# Patient Record
Sex: Female | Born: 1995 | Race: White | Hispanic: No | Marital: Married | Smoking: Former smoker
Health system: Southern US, Community
[De-identification: ages and names within clinical notes are randomized; demographics above are authoritative.]

## PROBLEM LIST (undated history)

## (undated) DIAGNOSIS — G43909 Migraine, unspecified, not intractable, without status migrainosus: Secondary | ICD-10-CM

## (undated) DIAGNOSIS — K219 Gastro-esophageal reflux disease without esophagitis: Secondary | ICD-10-CM

## (undated) DIAGNOSIS — K635 Polyp of colon: Secondary | ICD-10-CM

## (undated) DIAGNOSIS — J302 Other seasonal allergic rhinitis: Secondary | ICD-10-CM

## (undated) DIAGNOSIS — N6452 Nipple discharge: Secondary | ICD-10-CM

## (undated) DIAGNOSIS — E785 Hyperlipidemia, unspecified: Secondary | ICD-10-CM

## (undated) HISTORY — DX: Hyperlipidemia, unspecified: E78.5

## (undated) HISTORY — DX: Polyp of colon: K63.5

## (undated) HISTORY — PX: WISDOM TOOTH EXTRACTION: SHX21

---
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
## 2013-01-14 ENCOUNTER — Other Ambulatory Visit: Payer: BC Managed Care – PPO | Admitting: Internal Medicine

## 2013-01-14 DIAGNOSIS — N912 Amenorrhea, unspecified: Secondary | ICD-10-CM

## 2013-01-14 DIAGNOSIS — E785 Hyperlipidemia, unspecified: Secondary | ICD-10-CM

## 2013-01-14 DIAGNOSIS — R634 Abnormal weight loss: Secondary | ICD-10-CM

## 2013-01-14 DIAGNOSIS — Z0000 Encounter for general adult medical examination without abnormal findings: Secondary | ICD-10-CM

## 2013-01-14 DIAGNOSIS — R112 Nausea with vomiting, unspecified: Secondary | ICD-10-CM

## 2013-01-14 LAB — CHOLESTEROL, TOTAL: Cholesterol: 167 mg/dL (ref 0–200)

## 2013-01-14 LAB — CBC WITH DIFFERENTIAL/PLATELET
Basophils Absolute: 0 10*3/uL (ref 0.0–0.1)
HCT: 37.6 % (ref 36.0–46.0)
Lymphocytes Relative: 38 % (ref 12–46)
Lymphs Abs: 2.4 10*3/uL (ref 0.7–4.0)
Monocytes Absolute: 0.3 10*3/uL (ref 0.1–1.0)
Neutro Abs: 3.7 10*3/uL (ref 1.7–7.7)
RBC: 4.23 MIL/uL (ref 3.87–5.11)
RDW: 13.3 % (ref 11.5–15.5)
WBC: 6.5 10*3/uL (ref 4.0–10.5)

## 2013-01-14 LAB — COMPREHENSIVE METABOLIC PANEL
AST: 16 U/L (ref 0–37)
Albumin: 4.7 g/dL (ref 3.5–5.2)
Alkaline Phosphatase: 44 U/L (ref 39–117)
Glucose, Bld: 87 mg/dL (ref 70–99)
Potassium: 3.9 mEq/L (ref 3.5–5.3)
Sodium: 138 mEq/L (ref 135–145)
Total Protein: 7.1 g/dL (ref 6.0–8.3)

## 2013-02-18 ENCOUNTER — Ambulatory Visit (INDEPENDENT_AMBULATORY_CARE_PROVIDER_SITE_OTHER): Payer: BC Managed Care – PPO | Admitting: Internal Medicine

## 2013-02-18 ENCOUNTER — Encounter: Payer: Self-pay | Admitting: Internal Medicine

## 2013-02-18 VITALS — BP 96/58 | HR 88 | Temp 98.7°F | Ht 61.75 in | Wt 101.0 lb

## 2013-02-18 DIAGNOSIS — N912 Amenorrhea, unspecified: Secondary | ICD-10-CM

## 2013-02-18 DIAGNOSIS — N946 Dysmenorrhea, unspecified: Secondary | ICD-10-CM

## 2013-02-18 DIAGNOSIS — E785 Hyperlipidemia, unspecified: Secondary | ICD-10-CM

## 2013-02-18 DIAGNOSIS — Z8669 Personal history of other diseases of the nervous system and sense organs: Secondary | ICD-10-CM

## 2013-02-18 DIAGNOSIS — F439 Reaction to severe stress, unspecified: Secondary | ICD-10-CM

## 2013-02-18 DIAGNOSIS — Z0000 Encounter for general adult medical examination without abnormal findings: Secondary | ICD-10-CM

## 2013-02-18 DIAGNOSIS — N926 Irregular menstruation, unspecified: Secondary | ICD-10-CM

## 2013-02-18 DIAGNOSIS — Z733 Stress, not elsewhere classified: Secondary | ICD-10-CM

## 2013-02-18 NOTE — Progress Notes (Signed)
Subjective:    Patient ID: Vanessa Winters, female    DOB: [birth year], 17 y.o.   MRN: [id number]  HPI 17 year old female with familial hyperlipidemia in today for health maintenance and evaluation of medical issues. His been having considerable problems with frequent headaches which sound like migraine headaches. Sometimes associated with nausea and vomiting. Patient is attending community college in [place]. 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 In October she broke up with a long-term boyfriend. Seems happier nail and is dating someone new. Doesn't recall that any foods trigger the headaches. Says she's getting enough sleep. However there stress in the home with her sister who is suspected of being on drugs and exhibiting mood swings.  Patient has been having issues over the past year with infrequent menses up to 4 months. When she does have a menstrual period there is severe dysmenorrhea. She denies being sexually active. She refuses to have Pap smear. Mother is with her today in the exam room and is not eager for her to have Pap smear either. I think she needs to be evaluated by GYN physician for menstrual irregularities. I did do a pregnancy test in November which was negative. At that time she was complaining of headaches but was not able to get an appointment here before now because she was away at school and we had no openings before now. She had normal CBC, C. met, TSH in November.  Patient also complaining of frequent episodes of swollen lymph nodes in left axilla. Says this is been going on for 3 years.  Social history: Denies being sexually active. 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  Family history: Father with history of hyperlipidemia. Mother with history of breast cancer. 2 sisters both with high cholesterol. Father has not wanted children to be treated with lipid-lowering  agents.  Patient was the 7 lbs. 11 oz. product of a vaginal delivery without complications. Normal developmental milestones.  Patient was evaluated for intermittent paresthesias of legs and arms in 2010 at [facility]. 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 However subsequently they felt she did not have any thyroid abnormality. Subsequently paresthesias and abdominal pain discontinued and were likely related to some stress going on when she was playing softball.    Review of Systems  Constitutional: Negative.        Has lost 7 pounds since 2013  Eyes: Negative.   Respiratory: Negative.   Cardiovascular: Negative.   Gastrointestinal: Negative.   Endocrine: Negative.   Genitourinary:       Amenorrhea up to 4 months at a time didn't has severe dysmenorrhea with menses  Allergic/Immunologic: Negative.   Neurological: Positive for headaches.  Hematological: Negative.   Psychiatric/Behavioral: Negative.   All other systems reviewed and are negative.       Objective:   Physical Exam  Vitals reviewed. Constitutional: She appears well-developed and well-nourished. No distress.  HENT:  Head: Normocephalic and atraumatic.  Right Ear: External ear normal.  Left Ear: External ear normal.  Mouth/Throat: Oropharynx is clear and moist. No oropharyngeal exudate.  Eyes: Conjunctivae and EOM are normal. Right eye exhibits no discharge. Left eye exhibits no discharge.  Neck: Normal range of motion. Neck supple. No JVD present. No thyromegaly present.  No axillary or cervical adenopathy.  Cardiovascular: Normal rate, regular rhythm, normal heart sounds and intact distal pulses.   No murmur  heard. Pulmonary/Chest: Effort normal and breath sounds normal. No stridor. She has no wheezes.  Breasts normal female  Abdominal: Soft. Bowel sounds are normal. She exhibits no distension and no mass. There is no rebound and no guarding.  Genitourinary:    Deferred  Musculoskeletal: Normal range of motion. She exhibits no edema.  No femoral adenopathy  Neurological: She is alert. She has normal reflexes. No cranial nerve deficit. Coordination normal.  Skin: Skin is warm and dry. No rash noted. She is not diaphoretic.  Psychiatric: She has a normal mood and affect. Her behavior is normal. Thought content normal.          Assessment & Plan:  Migraine headaches  Amenorrhea up to 4 months followed by severe dysmenorrhea with menstrual period. This is new over the past year.  Complaint of axillary adenopathy-no adenopathy found today  Stress at home with sister who is exhibiting mood swings and possibly abusing drugs  Plan: Amitriptyline 10 mg at bedtime. Imitrex tablets 100 mg one by mouth at onset of severe migraine not to exceed one tablet per week. May need to see headache specialist. Suggested GYN evaluation. She returns to school January 9 so it may be difficult to get GYN appointment before that time. Spent one hour with patient and her mother

## 2013-02-18 NOTE — Patient Instructions (Signed)
Take amitriptyline 10 mg at bedtime. Take Imitrex tablets at onset of severe migraine. GYN evaluation for irregular menses

## 2013-08-12 ENCOUNTER — Other Ambulatory Visit: Payer: Self-pay

## 2013-08-12 ENCOUNTER — Telehealth: Payer: Self-pay | Admitting: Internal Medicine

## 2013-08-12 MED ORDER — AMITRIPTYLINE HCL 10 MG PO TABS: 10.0000 mg | ORAL_TABLET | Freq: Every day | ORAL | Status: DC

## 2013-08-12 MED ORDER — SUMATRIPTAN SUCCINATE 100 MG PO TABS: 100.0000 mg | ORAL_TABLET | ORAL | Status: DC | PRN

## 2013-08-12 NOTE — Telephone Encounter (Signed)
Refill Imitrex 100 mg disp: one month supply with no refills and Amitriptyline 10 mg #30 with 2 refills. Book PE Dec 2015.

## 2013-08-22 ENCOUNTER — Other Ambulatory Visit: Payer: Self-pay

## 2013-08-22 MED ORDER — SUMATRIPTAN SUCCINATE 100 MG PO TABS: 100.0000 mg | ORAL_TABLET | ORAL | Status: DC | PRN

## 2014-01-13 ENCOUNTER — Other Ambulatory Visit: Payer: Self-pay | Admitting: Internal Medicine

## 2014-01-14 NOTE — Telephone Encounter (Signed)
Refill once. PE due after Feb 18, 2014. Needs appt.

## 2014-01-14 NOTE — Telephone Encounter (Signed)
Imitrex refill sent to pharmacy with message for patient to call and schedule appointment.

## 2014-03-10 ENCOUNTER — Other Ambulatory Visit: Payer: Self-pay | Admitting: Internal Medicine

## 2014-05-09 ENCOUNTER — Other Ambulatory Visit: Payer: BLUE CROSS/BLUE SHIELD | Admitting: Internal Medicine

## 2014-05-09 DIAGNOSIS — E785 Hyperlipidemia, unspecified: Secondary | ICD-10-CM

## 2014-05-09 DIAGNOSIS — Z0000 Encounter for general adult medical examination without abnormal findings: Secondary | ICD-10-CM

## 2014-05-09 DIAGNOSIS — N926 Irregular menstruation, unspecified: Secondary | ICD-10-CM

## 2014-05-09 LAB — LIPID PANEL
CHOLESTEROL: 231 mg/dL — AB (ref 0–200)
HDL: 38 mg/dL — ABNORMAL LOW (ref >=46)
LDL Cholesterol: 182 mg/dL — ABNORMAL HIGH (ref 0–99)
Total CHOL/HDL Ratio: 6.1 Ratio
Triglycerides: 57 mg/dL (ref <150)
VLDL: 11 mg/dL (ref 0–40)

## 2014-05-09 LAB — CBC WITH DIFFERENTIAL/PLATELET
BASOS PCT: 0 % (ref 0–1)
Basophils Absolute: 0 10*3/uL (ref 0.0–0.1)
EOS ABS: 0.1 10*3/uL (ref 0.0–0.7)
Eosinophils Relative: 1 % (ref 0–5)
HEMATOCRIT: 38.1 % (ref 36.0–46.0)
Hemoglobin: 12.6 g/dL (ref 12.0–15.0)
Lymphocytes Relative: 33 % (ref 12–46)
Lymphs Abs: 2.4 10*3/uL (ref 0.7–4.0)
MCH: 30.4 pg (ref 26.0–34.0)
MCHC: 33.1 g/dL (ref 30.0–36.0)
MCV: 91.8 fL (ref 78.0–100.0)
MONO ABS: 0.2 10*3/uL (ref 0.1–1.0)
MONOS PCT: 3 % (ref 3–12)
MPV: 9.7 fL (ref 8.6–12.4)
Neutro Abs: 4.6 10*3/uL (ref 1.7–7.7)
Neutrophils Relative %: 63 % (ref 43–77)
Platelets: 263 10*3/uL (ref 150–400)
RBC: 4.15 MIL/uL (ref 3.87–5.11)
RDW: 12.6 % (ref 11.5–15.5)
WBC: 7.3 10*3/uL (ref 4.0–10.5)

## 2014-05-09 LAB — COMPREHENSIVE METABOLIC PANEL
ALBUMIN: 4.1 g/dL (ref 3.5–5.2)
ALT: 10 U/L (ref 0–35)
AST: 15 U/L (ref 0–37)
Alkaline Phosphatase: 46 U/L (ref 39–117)
BUN: 8 mg/dL (ref 6–23)
CALCIUM: 9.1 mg/dL (ref 8.4–10.5)
CHLORIDE: 106 meq/L (ref 96–112)
CO2: 25 meq/L (ref 19–32)
CREATININE: 0.71 mg/dL (ref 0.50–1.10)
GLUCOSE: 79 mg/dL (ref 70–99)
POTASSIUM: 4.3 meq/L (ref 3.5–5.3)
Sodium: 139 mEq/L (ref 135–145)
Total Bilirubin: 0.4 mg/dL (ref 0.2–1.2)
Total Protein: 6.9 g/dL (ref 6.0–8.3)

## 2014-05-10 LAB — TSH: TSH: 0.978 u[IU]/mL (ref 0.350–4.500)

## 2014-05-23 ENCOUNTER — Ambulatory Visit (INDEPENDENT_AMBULATORY_CARE_PROVIDER_SITE_OTHER): Payer: BLUE CROSS/BLUE SHIELD | Admitting: Internal Medicine

## 2014-05-23 ENCOUNTER — Other Ambulatory Visit (HOSPITAL_COMMUNITY)
Admission: RE | Admit: 2014-05-23 | Discharge: 2014-05-23 | Disposition: A | Discharge status: Home / Self Care | Payer: BLUE CROSS/BLUE SHIELD | Source: Ambulatory Visit | Attending: Internal Medicine | Admitting: Internal Medicine

## 2014-05-23 VITALS — BP 114/60 | HR 74 | Temp 98.6°F | Ht 61.5 in | Wt 113.0 lb

## 2014-05-23 DIAGNOSIS — Z0000 Encounter for general adult medical examination without abnormal findings: Secondary | ICD-10-CM

## 2014-05-23 DIAGNOSIS — Z01419 Encounter for gynecological examination (general) (routine) without abnormal findings: Secondary | ICD-10-CM | POA: Diagnosis not present

## 2014-05-23 DIAGNOSIS — Z8669 Personal history of other diseases of the nervous system and sense organs: Secondary | ICD-10-CM | POA: Diagnosis not present

## 2014-05-23 DIAGNOSIS — Z1159 Encounter for screening for other viral diseases: Secondary | ICD-10-CM

## 2014-05-23 DIAGNOSIS — E785 Hyperlipidemia, unspecified: Secondary | ICD-10-CM | POA: Diagnosis not present

## 2014-05-23 LAB — POCT URINALYSIS DIPSTICK
Bilirubin, UA: NEGATIVE
Glucose, UA: NEGATIVE
Ketones, UA: NEGATIVE
Leukocytes, UA: NEGATIVE
Nitrite, UA: NEGATIVE
Protein, UA: NEGATIVE
RBC UA: NEGATIVE
SPEC GRAV UA: 1.01
UROBILINOGEN UA: NEGATIVE
pH, UA: 7.5

## 2014-05-23 MED ORDER — SUMATRIPTAN SUCCINATE 100 MG PO TABS: ORAL_TABLET | ORAL | Status: DC

## 2014-05-23 MED ORDER — SIMVASTATIN 10 MG PO TABS: 10.0000 mg | ORAL_TABLET | Freq: Every day | ORAL | Status: DC

## 2014-05-23 MED ORDER — AMITRIPTYLINE HCL 10 MG PO TABS: ORAL_TABLET | ORAL | Status: DC

## 2014-05-23 MED ORDER — HYDROCODONE-ACETAMINOPHEN 5-325 MG PO TABS: 1.0000 | ORAL_TABLET | Freq: Four times a day (QID) | ORAL | Status: DC | PRN

## 2014-05-23 NOTE — Patient Instructions (Signed)
Take one or 2 Elavil at bedtime for migraine prevention. Take rescue medication Norco 5/325 if Imitrex does not work within an hour to 2 hours. Do not wait too long to take Imitrex at onset of migraine. Start Zocor 10 mg daily and return in 3 months

## 2014-05-24 LAB — HIV ANTIBODY (ROUTINE TESTING W REFLEX): HIV: NONREACTIVE

## 2014-05-24 LAB — HEPATITIS C ANTIBODY: HCV Ab: NEGATIVE

## 2014-05-24 LAB — HEPATITIS B SURFACE ANTIBODY,QUALITATIVE: Hep B S Ab: NEGATIVE

## 2014-05-27 LAB — CYTOLOGY - PAP

## 2014-05-28 ENCOUNTER — Telehealth: Payer: Self-pay | Admitting: *Deleted

## 2014-05-28 NOTE — Telephone Encounter (Signed)
Spoke with patient reviewed labs . She states she is having no symptoms of yeast infection at this time. Scheduled patient to start Hep B series due to negative titer.

## 2014-06-13 ENCOUNTER — Ambulatory Visit (INDEPENDENT_AMBULATORY_CARE_PROVIDER_SITE_OTHER): Payer: BLUE CROSS/BLUE SHIELD | Admitting: Internal Medicine

## 2014-06-13 VITALS — HR 72 | Temp 98.0°F

## 2014-06-13 DIAGNOSIS — Z0000 Encounter for general adult medical examination without abnormal findings: Secondary | ICD-10-CM

## 2014-06-13 DIAGNOSIS — Z23 Encounter for immunization: Secondary | ICD-10-CM | POA: Diagnosis not present

## 2014-06-13 NOTE — Progress Notes (Signed)
Patient presents today for repeat Hep B series. Patient to get vaccine #1 today. VS stable . Patient tolerated injection well.

## 2014-07-11 ENCOUNTER — Ambulatory Visit (INDEPENDENT_AMBULATORY_CARE_PROVIDER_SITE_OTHER): Payer: BLUE CROSS/BLUE SHIELD | Admitting: Internal Medicine

## 2014-07-11 VITALS — BP 110/64 | HR 70 | Temp 98.4°F

## 2014-07-11 DIAGNOSIS — Z0000 Encounter for general adult medical examination without abnormal findings: Secondary | ICD-10-CM | POA: Diagnosis not present

## 2014-07-11 MED ORDER — HEPATITIS B VAC RECOMBINANT 10 MCG/ML IJ SUSP
1.0000 mL | Freq: Once | INTRAMUSCULAR | Status: AC
  Administered 2014-07-11: 10 ug via INTRAMUSCULAR

## 2014-07-11 NOTE — Progress Notes (Signed)
Patient presents today for Hep B vaccine #2 in series. VS Stable. Patient tolerated injection well. Next injection due Oct 14,2016.

## 2014-07-20 ENCOUNTER — Encounter: Payer: Self-pay | Admitting: Internal Medicine

## 2014-07-20 NOTE — Progress Notes (Signed)
Subjective:    Patient ID: Vanessa Winters, female    DOB: [birth year], 19 y.o.   MRN: [id number]  HPI 19 year old Female in today for health maintenance exam and evaluation of medical issues including migraine headaches and hyperlipidemia. Hyperlipidemia is familial in her father and 2 sisters. Father had not wanted her a lipid-lowering medication but she is considering it.  In 2010 she had intermittent paresthesias of her legs and arms with negative workup by orthopedist, Dr. Otelia Sergeant. She had some unexplained abdominal pain at that time. 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 They felt she did not have any thyroid abnormality. Subsequently paresthesias and abdominal pain discontinued and were likely related to stress going on when she was playing softball.  Family history: Father with hyperlipidemia. Mother with history of breast cancer. 2 sisters with hyperlipidemia. One sister with history of mood swings and possible drug abuse.  Was diagnosed with migraine headaches and December 2014. 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 She had had a breakup with long-term boyfriend. Did not recall any foods triggering headaches. Was placed on Elavil 10 mg at bedtime and given prescription for generic Imitrex tablets at that time. This seems to have worked well for her.  In 2014 she denied being sexually active. Refuses to have Pap smear although she was complaining of irregular menses and severe dysmenorrhea. She is now on oral contraception as per gynecologist.  Social history: Single with small child. Wants profession in health careers.  Review of Systems  Constitutional: Negative.   Neurological:       Occasional migraine headache  All other systems reviewed and are negative.      Objective:   Physical Exam  Constitutional: She is  oriented to person, place, and time. She appears well-developed and well-nourished. No distress.  HENT:  Head: Normocephalic and atraumatic.  Right Ear: External ear normal.  Left Ear: External ear normal.  Mouth/Throat: Oropharynx is clear and moist. No oropharyngeal exudate.  Eyes: Conjunctivae and EOM are normal. Pupils are equal, round, and reactive to light. Right eye exhibits no discharge. Left eye exhibits no discharge. No scleral icterus.  Neck: Neck supple. No thyromegaly present.  Cardiovascular: Normal rate, regular rhythm, normal heart sounds and intact distal pulses.   No murmur heard. Pulmonary/Chest: Effort normal and breath sounds normal. No respiratory distress. She has no wheezes. She has no rales.  Abdominal: Soft. Bowel sounds are normal. She exhibits no distension and no mass. There is no tenderness. There is no rebound and no guarding.  Genitourinary:  Pap taken. Bimanual normal.  Musculoskeletal: Normal range of motion. She exhibits no edema.  Neurological: She is alert and oriented to person, place, and time. She has normal reflexes. No cranial nerve deficit. Coordination normal.  Skin: Skin is warm and dry. No rash noted. She is not diaphoretic.  Psychiatric: She has a normal mood and affect. Her behavior is normal. Judgment and thought content normal.  Vitals reviewed.         Assessment & Plan:  History of migraine headaches. Give rescue medication Norco 5/325 to take if Imitrex does not work. Do not hesitate to take Imitrex and also of migraine. Continue Elavil 10 mg at bedtime.  Hyperlipidemia-start Zocor 10 mg daily. Total cholesterol was 231 with an LDL cholesterol of 182 and an HDL cholesterol of 38. 3  years ago total cholesterol was 229. 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 Patient will need repeat hepatitis B series with follow-up.

## 2014-08-04 ENCOUNTER — Other Ambulatory Visit: Payer: Self-pay | Admitting: Internal Medicine

## 2014-08-22 ENCOUNTER — Other Ambulatory Visit: Payer: BLUE CROSS/BLUE SHIELD | Admitting: Internal Medicine

## 2014-08-22 DIAGNOSIS — E785 Hyperlipidemia, unspecified: Secondary | ICD-10-CM

## 2014-08-22 DIAGNOSIS — Z79899 Other long term (current) drug therapy: Secondary | ICD-10-CM

## 2014-08-22 LAB — HEPATIC FUNCTION PANEL
ALBUMIN: 4 g/dL (ref 3.5–5.2)
ALT: 8 U/L (ref 0–35)
AST: 14 U/L (ref 0–37)
Alkaline Phosphatase: 46 U/L (ref 39–117)
BILIRUBIN INDIRECT: 0.2 mg/dL (ref 0.2–1.2)
Bilirubin, Direct: 0.1 mg/dL (ref 0.0–0.3)
TOTAL PROTEIN: 6.6 g/dL (ref 6.0–8.3)
Total Bilirubin: 0.3 mg/dL (ref 0.2–1.2)

## 2014-08-22 LAB — LIPID PANEL
CHOL/HDL RATIO: 4 ratio
CHOLESTEROL: 180 mg/dL (ref 0–200)
HDL: 45 mg/dL — AB (ref >=46)
LDL Cholesterol: 123 mg/dL — ABNORMAL HIGH (ref 0–99)
Triglycerides: 61 mg/dL (ref <150)
VLDL: 12 mg/dL (ref 0–40)

## 2014-08-29 ENCOUNTER — Encounter: Payer: Self-pay | Admitting: Internal Medicine

## 2014-08-29 ENCOUNTER — Ambulatory Visit (INDEPENDENT_AMBULATORY_CARE_PROVIDER_SITE_OTHER): Payer: BLUE CROSS/BLUE SHIELD | Admitting: Internal Medicine

## 2014-08-29 VITALS — BP 106/62 | HR 100 | Temp 99.4°F | Wt 122.0 lb

## 2014-08-29 DIAGNOSIS — E785 Hyperlipidemia, unspecified: Secondary | ICD-10-CM | POA: Diagnosis not present

## 2014-08-29 MED ORDER — SIMVASTATIN 20 MG PO TABS
20.0000 mg | ORAL_TABLET | Freq: Every day | ORAL | Status: DC
Start: 2014-08-29 — End: 2014-12-08

## 2014-08-29 NOTE — Patient Instructions (Signed)
Increase Zocor to 20 mg daily and return October for hepatitis B vaccine, fasting lipid panel and liver functions without M.D. visit. Physical exam due in April 2017

## 2014-08-29 NOTE — Progress Notes (Signed)
   Subjective:    Patient ID: Zenovia JordanRachel Gray, female    DOB: 1995-04-19, 19 y.o.   MRN: [id number]  HPI She has a long-standing history of hyperlipidemia. Family history of hyperlipidemia in her father and her sisters. At last visit she was started on Zocor 10 mg daily and is here for follow-up. Total cholesterol has decreased from 231-180. LDL cholesterol has decreased from 182-123. No side effects. Liver panel is normal.     Review of Systems see above     Objective:   Physical Exam  Not examined. Feeling well. No new complaints. Reviewed lab work with her in detail      Assessment & Plan:  Hyperlipidemia  Plan: Increase Zocor to 20 mg daily in an effort to get LDL below 100. She has appointment in October for hepatitis B vaccine and we will do fasting lipid panel liver functions at that time without office visit. Physical exam due April 2017

## 2014-09-05 ENCOUNTER — Other Ambulatory Visit: Payer: Self-pay | Admitting: Internal Medicine

## 2014-10-30 ENCOUNTER — Other Ambulatory Visit: Payer: Self-pay | Admitting: Internal Medicine

## 2014-11-07 ENCOUNTER — Encounter: Payer: Self-pay | Admitting: Internal Medicine

## 2014-11-07 ENCOUNTER — Ambulatory Visit (INDEPENDENT_AMBULATORY_CARE_PROVIDER_SITE_OTHER): Payer: BLUE CROSS/BLUE SHIELD | Admitting: Internal Medicine

## 2014-11-07 VITALS — BP 102/70 | HR 88 | Temp 98.6°F | Wt 122.0 lb

## 2014-11-07 DIAGNOSIS — L732 Hidradenitis suppurativa: Secondary | ICD-10-CM

## 2014-11-07 MED ORDER — DOXYCYCLINE HYCLATE 100 MG PO TABS: 100.0000 mg | ORAL_TABLET | Freq: Two times a day (BID) | ORAL | Status: DC

## 2014-11-07 MED ORDER — MUPIROCIN 2 % EX OINT: TOPICAL_OINTMENT | CUTANEOUS | Status: DC

## 2014-11-07 NOTE — Progress Notes (Signed)
   Subjective:    Patient ID: Vanessa Winters, female    DOB: [birth year], 19 y.o.   MRN: [id number]  HPI  History of recurrent swelling in axilla once or twice a year. Is never sought treatment here for this. 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 Symptoms started several days ago. No fever or chills. Previous episode several months ago which she simply wrote out for 3 weeks without treatment. Both her sisters have infants.  Patient does shave under her arms. Uses deodorant.    Review of Systems     Objective:   Physical Exam Tender inflamed carbuncles both legs a lot consistent with hidradenitis suppurativa       Assessment & Plan:  Hidradenitis suppurativa  Plan: Bactroban to both nostrils twice daily for 10 days. Doxycycline 100 mg twice daily for 10 days. Bathe with Dial soap and wash cloth daily.

## 2014-11-07 NOTE — Patient Instructions (Signed)
Use Bactroban in nostrils twice daily for 10 days. Doxycycline 100 mg twice daily for 10 days. Bathe with Dial soap daily and use washcloth to days. Will need antibiotics if symptoms recur

## 2014-12-05 ENCOUNTER — Other Ambulatory Visit (INDEPENDENT_AMBULATORY_CARE_PROVIDER_SITE_OTHER): Payer: BLUE CROSS/BLUE SHIELD | Admitting: Internal Medicine

## 2014-12-05 DIAGNOSIS — Z23 Encounter for immunization: Secondary | ICD-10-CM

## 2014-12-05 DIAGNOSIS — Z9229 Personal history of other drug therapy: Secondary | ICD-10-CM

## 2014-12-05 DIAGNOSIS — E785 Hyperlipidemia, unspecified: Secondary | ICD-10-CM | POA: Diagnosis not present

## 2014-12-05 DIAGNOSIS — Z79899 Other long term (current) drug therapy: Secondary | ICD-10-CM

## 2014-12-05 LAB — HEPATIC FUNCTION PANEL
ALT: 9 U/L (ref 6–29)
AST: 17 U/L (ref 10–30)
Albumin: 4.3 g/dL (ref 3.6–5.1)
Alkaline Phosphatase: 46 U/L (ref 33–115)
BILIRUBIN DIRECT: 0.1 mg/dL (ref <=0.2)
BILIRUBIN INDIRECT: 0.4 mg/dL (ref 0.2–1.2)
BILIRUBIN TOTAL: 0.5 mg/dL (ref 0.2–1.2)
TOTAL PROTEIN: 6.7 g/dL (ref 6.1–8.1)

## 2014-12-05 LAB — LIPID PANEL
Cholesterol: 167 mg/dL (ref 125–170)
HDL: 42 mg/dL — ABNORMAL LOW (ref >=46)
LDL CALC: 115 mg/dL — AB (ref <110)
Total CHOL/HDL Ratio: 4 Ratio (ref <=5.0)
Triglycerides: 52 mg/dL (ref <150)
VLDL: 10 mg/dL (ref <30)

## 2014-12-08 ENCOUNTER — Telehealth: Payer: Self-pay

## 2014-12-08 MED ORDER — SIMVASTATIN 40 MG PO TABS: 40.0000 mg | ORAL_TABLET | Freq: Every day | ORAL | Status: DC

## 2014-12-08 NOTE — Telephone Encounter (Signed)
Patient aware of lab results and medication increase.

## 2014-12-08 NOTE — Telephone Encounter (Signed)
-----   Message from Margaree MackintoshMary J Baxley, MD sent at 12/06/2014 12:35 PM EDT ----- Increase Zocor to 40 mg daily and recheck in February with [facility] lipid and liver panels

## 2014-12-08 NOTE — Telephone Encounter (Signed)
Left message for patient to call office regarding lab results and Zocor rx was sent to pharmacy.  Follow up appointment schedule for 04/02/2014.

## 2015-02-05 ENCOUNTER — Other Ambulatory Visit: Payer: Self-pay | Admitting: Internal Medicine

## 2015-04-02 ENCOUNTER — Telehealth: Payer: Self-pay | Admitting: Internal Medicine

## 2015-04-02 NOTE — Telephone Encounter (Signed)
Patient has f/u appointment scheduled for Friday, 04/03/15 @ 9:45 (although it never was put into EPIC, it was scheduled in our appointment book).  Patient called to cancel this appointment.  States she doesn't wish to re-schedule at this time.  No reason given for the cancellation.

## 2015-04-03 ENCOUNTER — Ambulatory Visit: Payer: Self-pay | Admitting: Internal Medicine

## 2015-04-14 ENCOUNTER — Other Ambulatory Visit: Payer: Self-pay | Admitting: Internal Medicine

## 2015-04-27 ENCOUNTER — Other Ambulatory Visit: Payer: Self-pay | Admitting: Internal Medicine

## 2015-06-04 ENCOUNTER — Other Ambulatory Visit: Payer: Self-pay | Admitting: Internal Medicine

## 2015-12-07 ENCOUNTER — Other Ambulatory Visit: Payer: Self-pay | Admitting: Internal Medicine

## 2016-04-27 DIAGNOSIS — Z029 Encounter for administrative examinations, unspecified: Secondary | ICD-10-CM

## 2016-04-28 ENCOUNTER — Other Ambulatory Visit: Payer: Self-pay | Admitting: Obstetrics & Gynecology

## 2016-04-28 ENCOUNTER — Other Ambulatory Visit (HOSPITAL_COMMUNITY)
Admission: RE | Admit: 2016-04-28 | Discharge: 2016-04-28 | Disposition: A | Discharge status: Home / Self Care | Payer: No Typology Code available for payment source | Source: Ambulatory Visit | Attending: Obstetrics and Gynecology | Admitting: Obstetrics and Gynecology

## 2016-04-28 DIAGNOSIS — Z01419 Encounter for gynecological examination (general) (routine) without abnormal findings: Secondary | ICD-10-CM | POA: Diagnosis not present

## 2016-04-29 ENCOUNTER — Other Ambulatory Visit: Payer: Self-pay | Admitting: Obstetrics & Gynecology

## 2016-04-29 DIAGNOSIS — N6452 Nipple discharge: Secondary | ICD-10-CM

## 2016-05-02 LAB — CYTOLOGY - PAP: Diagnosis: NEGATIVE

## 2016-05-02 LAB — HM PAP SMEAR: HM Pap smear: NEGATIVE

## 2016-05-06 ENCOUNTER — Ambulatory Visit
Admission: RE | Admit: 2016-05-06 | Discharge: 2016-05-06 | Disposition: A | Discharge status: Home / Self Care | Payer: No Typology Code available for payment source | Source: Ambulatory Visit | Attending: Obstetrics & Gynecology | Admitting: Obstetrics & Gynecology

## 2016-05-06 DIAGNOSIS — N6452 Nipple discharge: Secondary | ICD-10-CM

## 2016-05-19 ENCOUNTER — Other Ambulatory Visit: Payer: Self-pay | Admitting: Surgery

## 2016-05-19 ENCOUNTER — Ambulatory Visit: Payer: Self-pay | Admitting: Surgery

## 2016-05-19 DIAGNOSIS — N6452 Nipple discharge: Secondary | ICD-10-CM | POA: Insufficient documentation

## 2016-06-01 ENCOUNTER — Ambulatory Visit
Admission: RE | Admit: 2016-06-01 | Discharge: 2016-06-01 | Disposition: A | Discharge status: Home / Self Care | Payer: No Typology Code available for payment source | Source: Ambulatory Visit | Attending: Surgery | Admitting: Surgery

## 2016-06-01 DIAGNOSIS — N6452 Nipple discharge: Secondary | ICD-10-CM

## 2016-06-01 MED ORDER — GADOBENATE DIMEGLUMINE 529 MG/ML IV SOLN
12.0000 mL | Freq: Once | INTRAVENOUS | Status: AC | PRN
  Administered 2016-06-01: 12 mL via INTRAVENOUS

## 2016-06-09 ENCOUNTER — Ambulatory Visit: Payer: Self-pay | Admitting: Surgery

## 2016-06-09 NOTE — H&P (Signed)
History of Present Illness Vanessa Winters. Vanessa Ditto MD; 06/09/2016 2:15 PM) The patient is a 21 year old female who presents with a complaint of nipple discharge. PCP - Dr. Maurice Winters G[facility] - [name] Referred by Dr. Quincy Winters for right nipple discharge.   This is a healthy 21 year old female who presents with a three-month history of spontaneous whitish nipple discharge. This drainage seems to occur as a single orifice located about 10:00 just adjacent to the center of her nipple. She denies any pain associated with this. The patient has a strong family history of breast cancer. She underwent ultrasound of the right breast that showed a benign 1.6 cm cyst in the lower outer subareolar right breast. No other findings are noted. We examined her a few weeks ago and ordered a breast MRI. This also did not show any suspicious findings. She continues to have a noticeable amount of discharge on a daily basis. The drainage is darker. It is no longer white but seems to be a lighter shade of brown  Menarche - 10 Nulliparous Hormones - Nuvaring Family history- Mother - 34 - DCIS ER-; lumpectomy/ XRT Maternal grandmother - 60 - IDC ER+; lumpectomy/ SLNB; chemo/ XRT; recurrence at age 48 - deceased    CLINICAL DATA: 21 year old presenting with an approximate two-month history of spontaneous right nipple discharge from a single duct orifice, white in color. She has a strong family history of breast cancer in her mother (age 83) and in her maternal grandmother (age early 65s).  EXAM: ULTRASOUND OF THE RIGHT BREAST  COMPARISON: None.  FINDINGS: On physical exam, we are able to express a white discharge from a single duct orifice at the approximate 10 o'clock position of the right nipple.  Targeted subareolar right breast ultrasound is performed, showing an oval circumscribed parallel anechoic mass with excellent acoustic enhancement and no internal power Doppler flow at the 7  o'clock position approximately 1 cm from the nipple measuring approximately 0.9 x 1.6 x 1.4 cm. No suspicious solid mass, intraductal masses or abnormal acoustic shadowing is identified.  IMPRESSION: 1. Benign 1.6 cm cyst in the lower outer subareolar right breast. 2. No abnormalities are identified to explain the patient's single duct right nipple discharge.  RECOMMENDATION: 1. Surgical consultation and possible breast MRI for further evaluation of the single duct right nipple discharge. We will arrange the surgical consultation for the patient and she will be contacted with this information by the nurse at the [facility]. 2. Consider genetic counseling and possible genetic testing given her strong family history. 3. The patient should begin annual screening mammography at age 25 (10 years younger than the age of diagnosis of her mother).  I have discussed the findings and recommendations with the patient. Results were also provided in writing at the conclusion of the visit.  BI-RADS CATEGORY 2: Benign.   Electronically Signed By: Vanessa Winters M.D. On: 05/06/2016 16:50  CLINICAL DATA: Right nipple discharge. Strong family history of breast cancer including mother at age 51 and maternal great aunt at age 64.  LABS: Not applicable  EXAM: BILATERAL BREAST MRI WITH AND WITHOUT CONTRAST  TECHNIQUE: Multiplanar, multisequence MR images of both breasts were obtained prior to and following the intravenous administration of well ml of MultiHance.  THREE-DIMENSIONAL MR IMAGE RENDERING ON INDEPENDENT WORKSTATION:  Three-dimensional MR images were rendered by post-processing of the original MR data on an independent workstation. The three-dimensional MR images were interpreted, and findings are reported in the following  complete MRI report for this study. Three dimensional images were evaluated at the independent  DynaCad workstation  COMPARISON: Previous exam(s).  FINDINGS: Breast composition: d. Extreme fibroglandular tissue.  Background parenchymal enhancement: Moderate.  Right breast: No suspicious enhancing mass, non-mass enhancement or secondary signs of malignancy. Scattered benign cysts.  Left breast: No suspicious enhancing mass, non-mass enhancement or secondary signs of malignancy. Scattered benign cysts.  Lymph nodes: No abnormal appearing lymph nodes.  Ancillary findings: None.  IMPRESSION: No evidence of malignancy within either breast.  RECOMMENDATION: 1. Clinical follow-up for the right nipple discharge. 2. Annual screening mammography starting at age 5 (10 years younger than the age of diagnosis of her mother mother), as also recommended on the earlier ultrasound report. 3. Would also consider the addition of annual screening breast MRI to annual screening mammography starting at age 89, given the strong family history and extremely dense breasts. Per American Cancer Society guidelines, annual screening MRI of the breasts is recommended if a risk assessment calculation for breast cancer, preferably using the Tyrer-Cuzick model, measures greater than 20%.  BI-RADS CATEGORY 2: Benign.   Electronically Signed By: Vanessa Winters M.D. On: 06/01/2016 15:32     Physical Exam Vanessa Hazard K. [name] MD; 06/09/2016 2:15 PM)  The physical exam findings are as follows: Note:WDWN in NAD Eyes: Pupils equal, round; sclera anicteric HENT: Oral mucosa moist; good dentition Neck: No masses palpated, no thyromegaly Lungs: CTA bilaterally; normal respiratory effort Breasts: symmetric; no axillary lymphadenopathy; no dominant masses; normal left nipple; right nipple - no retraction; able to express thick whitish-tan drainage without odor from nipple duct orifice at 10:00 CV: Regular rate and rhythm; no murmurs; extremities well-perfused with no edema Abd: +bowel sounds, soft,  non-tender, no palpable organomegaly; no palpable hernias Skin: Warm, dry; no sign of jaundice Psychiatric - alert and oriented x 4; calm mood and affect    Assessment & Plan Vanessa Hazard K. [name] MD; 06/09/2016 2:17 PM)  FAMILY HISTORY OF BREAST CANCER (Z80.3)  DISCHARGE FROM RIGHT NIPPLE (N64.52)  Current Plans Schedule for Surgery - right nipple duct excision. The surgical procedure has been discussed with the patient. Potential risks, benefits, alternative treatments, and expected outcomes have been explained. All of the patient's questions at this time have been answered. We discussed the possibility of some numbness at the nipple that may last for a couple of years. We also discussed the risk of other duct orifices having drainage. The likelihood of reaching the patient's treatment goal is good. The patient understand the proposed surgical procedure and wishes to proceed. Note:At this point, there are not any further studies that would be beneficial and working up this problem. Our options really are observation with reimaging in 6 months versus nipple duct excision. The patient is obviously concerned because of her strong family history and would like to proceed with nipple duct excision.  Vanessa Winters. Corliss Skains, MD, [facility]  General/ Trauma Surgery  06/09/2016 2:17 PM

## 2016-07-22 DIAGNOSIS — N6452 Nipple discharge: Secondary | ICD-10-CM

## 2016-07-22 HISTORY — DX: Nipple discharge: N64.52

## 2016-07-28 ENCOUNTER — Encounter (HOSPITAL_BASED_OUTPATIENT_CLINIC_OR_DEPARTMENT_OTHER): Payer: Self-pay | Admitting: *Deleted

## 2016-07-28 NOTE — Pre-Procedure Instructions (Signed)
To come pick up Boost Breeze 8 oz. to drink by 0415 DOS. 

## 2016-08-01 NOTE — Progress Notes (Signed)
Boost drink dispensed with specific instructions for completion by 0430hrs DOS. NPO otherwise. Pt. verbalized understanding.

## 2016-08-03 NOTE — Anesthesia Preprocedure Evaluation (Signed)
Anesthesia Evaluation  Patient identified by MRN, date of birth, ID band Patient awake    Reviewed: Allergy & Precautions, H&P , Patient's Chart, lab work & pertinent test results, reviewed documented beta blocker date and time   Airway Mallampati: II  TM Distance: >3 FB Neck ROM: full    Dental no notable dental hx.    Pulmonary Current Smoker,    Pulmonary exam normal breath sounds clear to auscultation       Cardiovascular  Rhythm:regular Rate:Normal     Neuro/Psych    GI/Hepatic   Endo/Other    Renal/GU      Musculoskeletal   Abdominal   Peds  Hematology   Anesthesia Other Findings   Reproductive/Obstetrics                             Anesthesia Physical Anesthesia Plan  ASA: II  Anesthesia Plan: General   Post-op Pain Management:    Induction: Intravenous  PONV Risk Score and Plan: 1 and Ondansetron, Dexamethasone and Propofol  Airway Management Planned: LMA  Additional Equipment:   Intra-op Plan:   Post-operative Plan:   Informed Consent: I have reviewed the patients History and Physical, chart, labs and discussed the procedure including the risks, benefits and alternatives for the proposed anesthesia with the patient or authorized representative who has indicated his/her understanding and acceptance.   Dental Advisory Given  Plan Discussed with: CRNA and Surgeon  Anesthesia Plan Comments: ( )        Anesthesia Quick Evaluation

## 2016-08-04 ENCOUNTER — Ambulatory Visit (HOSPITAL_BASED_OUTPATIENT_CLINIC_OR_DEPARTMENT_OTHER): Payer: No Typology Code available for payment source | Admitting: Anesthesiology

## 2016-08-04 ENCOUNTER — Encounter (HOSPITAL_BASED_OUTPATIENT_CLINIC_OR_DEPARTMENT_OTHER)
Admission: RE | Disposition: A | Discharge status: Home / Self Care | Payer: Self-pay | Source: Ambulatory Visit | Attending: Surgery

## 2016-08-04 ENCOUNTER — Encounter (HOSPITAL_BASED_OUTPATIENT_CLINIC_OR_DEPARTMENT_OTHER): Payer: Self-pay

## 2016-08-04 ENCOUNTER — Ambulatory Visit (HOSPITAL_BASED_OUTPATIENT_CLINIC_OR_DEPARTMENT_OTHER)
Admission: RE | Admit: 2016-08-04 | Discharge: 2016-08-04 | Disposition: A | Discharge status: Home / Self Care | Payer: No Typology Code available for payment source | Source: Ambulatory Visit | Attending: Surgery | Admitting: Surgery

## 2016-08-04 DIAGNOSIS — Z803 Family history of malignant neoplasm of breast: Secondary | ICD-10-CM | POA: Insufficient documentation

## 2016-08-04 DIAGNOSIS — N6001 Solitary cyst of right breast: Secondary | ICD-10-CM | POA: Diagnosis not present

## 2016-08-04 DIAGNOSIS — F17200 Nicotine dependence, unspecified, uncomplicated: Secondary | ICD-10-CM | POA: Diagnosis not present

## 2016-08-04 DIAGNOSIS — N6452 Nipple discharge: Secondary | ICD-10-CM | POA: Diagnosis not present

## 2016-08-04 HISTORY — DX: Other seasonal allergic rhinitis: J30.2

## 2016-08-04 HISTORY — DX: Migraine, unspecified, not intractable, without status migrainosus: G43.909

## 2016-08-04 HISTORY — PX: BREAST DUCTAL SYSTEM EXCISION: SHX5242

## 2016-08-04 HISTORY — DX: Nipple discharge: N64.52

## 2016-08-04 HISTORY — DX: Gastro-esophageal reflux disease without esophagitis: K21.9

## 2016-08-04 SURGERY — EXCISION DUCTAL SYSTEM BREAST
Anesthesia: General | Site: Breast | Laterality: Right

## 2016-08-04 MED ORDER — LACTATED RINGERS IV SOLN
INTRAVENOUS | Status: DC
Start: 2016-08-04 — End: 2016-08-04
  Administered 2016-08-04 (×2): via INTRAVENOUS

## 2016-08-04 MED ORDER — CHLORHEXIDINE GLUCONATE CLOTH 2 % EX PADS: 6.0000 | MEDICATED_PAD | Freq: Once | CUTANEOUS | Status: DC

## 2016-08-04 MED ORDER — LIDOCAINE 2% (20 MG/ML) 5 ML SYRINGE
INTRAMUSCULAR | Status: AC
Start: 2016-08-04 — End: 2016-08-04
  Filled 2016-08-04: qty 20

## 2016-08-04 MED ORDER — PROPOFOL 10 MG/ML IV BOLUS
INTRAVENOUS | Status: DC | PRN
  Administered 2016-08-04: 150 mg via INTRAVENOUS

## 2016-08-04 MED ORDER — ACETAMINOPHEN 500 MG PO TABS
ORAL_TABLET | ORAL | Status: AC
Start: 2016-08-04 — End: 2016-08-04
  Filled 2016-08-04: qty 2

## 2016-08-04 MED ORDER — FENTANYL CITRATE (PF) 100 MCG/2ML IJ SOLN
INTRAMUSCULAR | Status: DC | PRN
  Administered 2016-08-04: 100 ug via INTRAVENOUS

## 2016-08-04 MED ORDER — GABAPENTIN 300 MG PO CAPS
ORAL_CAPSULE | ORAL | Status: AC
  Filled 2016-08-04: qty 1

## 2016-08-04 MED ORDER — PROPOFOL 500 MG/50ML IV EMUL
INTRAVENOUS | Status: AC
Start: 2016-08-04 — End: 2016-08-04
  Filled 2016-08-04: qty 50

## 2016-08-04 MED ORDER — FENTANYL CITRATE (PF) 100 MCG/2ML IJ SOLN
INTRAMUSCULAR | Status: AC
  Filled 2016-08-04: qty 2

## 2016-08-04 MED ORDER — CEFAZOLIN SODIUM-DEXTROSE 2-4 GM/100ML-% IV SOLN
INTRAVENOUS | Status: AC
  Filled 2016-08-04: qty 100

## 2016-08-04 MED ORDER — MIDAZOLAM HCL 2 MG/2ML IJ SOLN
INTRAMUSCULAR | Status: AC
  Filled 2016-08-04: qty 2

## 2016-08-04 MED ORDER — ONDANSETRON HCL 4 MG/2ML IJ SOLN
INTRAMUSCULAR | Status: AC
  Filled 2016-08-04: qty 2

## 2016-08-04 MED ORDER — CELECOXIB 400 MG PO CAPS: 400.0000 mg | ORAL_CAPSULE | ORAL | Status: DC

## 2016-08-04 MED ORDER — FENTANYL CITRATE (PF) 100 MCG/2ML IJ SOLN: 50.0000 ug | INTRAMUSCULAR | Status: DC | PRN

## 2016-08-04 MED ORDER — ACETAMINOPHEN 500 MG PO TABS
1000.0000 mg | ORAL_TABLET | ORAL | Status: AC
  Administered 2016-08-04: 1000 mg via ORAL

## 2016-08-04 MED ORDER — DEXAMETHASONE SODIUM PHOSPHATE 4 MG/ML IJ SOLN
INTRAMUSCULAR | Status: DC | PRN
  Administered 2016-08-04: 10 mg via INTRAVENOUS

## 2016-08-04 MED ORDER — GABAPENTIN 300 MG PO CAPS
300.0000 mg | ORAL_CAPSULE | ORAL | Status: AC
  Administered 2016-08-04: 300 mg via ORAL

## 2016-08-04 MED ORDER — DEXAMETHASONE SODIUM PHOSPHATE 10 MG/ML IJ SOLN
INTRAMUSCULAR | Status: AC
  Filled 2016-08-04: qty 1

## 2016-08-04 MED ORDER — FENTANYL CITRATE (PF) 100 MCG/2ML IJ SOLN
25.0000 ug | INTRAMUSCULAR | Status: DC | PRN
  Administered 2016-08-04 (×2): 50 ug via INTRAVENOUS

## 2016-08-04 MED ORDER — MIDAZOLAM HCL 5 MG/5ML IJ SOLN
INTRAMUSCULAR | Status: DC | PRN
  Administered 2016-08-04: 2 mg via INTRAVENOUS

## 2016-08-04 MED ORDER — HYDROCODONE-ACETAMINOPHEN 5-325 MG PO TABS: 1.0000 | ORAL_TABLET | Freq: Four times a day (QID) | ORAL | 0 refills | Status: DC | PRN

## 2016-08-04 MED ORDER — CEFAZOLIN SODIUM-DEXTROSE 2-4 GM/100ML-% IV SOLN
2.0000 g | INTRAVENOUS | Status: AC
Start: 2016-08-04 — End: 2016-08-04
  Administered 2016-08-04: 2 g via INTRAVENOUS

## 2016-08-04 MED ORDER — LIDOCAINE HCL (CARDIAC) 20 MG/ML IV SOLN
INTRAVENOUS | Status: DC | PRN
  Administered 2016-08-04: 30 mg via INTRAVENOUS

## 2016-08-04 MED ORDER — SCOPOLAMINE 1 MG/3DAYS TD PT72: 1.0000 | MEDICATED_PATCH | Freq: Once | TRANSDERMAL | Status: DC | PRN

## 2016-08-04 MED ORDER — BUPIVACAINE HCL (PF) 0.25 % IJ SOLN
INTRAMUSCULAR | Status: DC | PRN
  Administered 2016-08-04: 10 mL

## 2016-08-04 MED ORDER — SCOPOLAMINE 1 MG/3DAYS TD PT72
MEDICATED_PATCH | TRANSDERMAL | Status: DC | PRN
  Administered 2016-08-04: 1 via TRANSDERMAL

## 2016-08-04 MED ORDER — MIDAZOLAM HCL 2 MG/2ML IJ SOLN: 1.0000 mg | INTRAMUSCULAR | Status: DC | PRN

## 2016-08-04 SURGICAL SUPPLY — 47 items
APPLIER CLIP 9.375 MED OPEN (MISCELLANEOUS)
BENZOIN TINCTURE PRP APPL 2/3 (GAUZE/BANDAGES/DRESSINGS) ×2 IMPLANT
BLADE HEX COATED 2.75 (ELECTRODE) ×2 IMPLANT
BLADE SURG 15 STRL LF DISP TIS (BLADE) ×1 IMPLANT
BLADE SURG 15 STRL SS (BLADE) ×1
CANISTER SUCT 1200ML W/VALVE (MISCELLANEOUS) IMPLANT
CHLORAPREP W/TINT 26ML (MISCELLANEOUS) ×2 IMPLANT
CLIP APPLIE 9.375 MED OPEN (MISCELLANEOUS) IMPLANT
COVER BACK TABLE 60X90IN (DRAPES) ×2 IMPLANT
COVER MAYO STAND STRL (DRAPES) ×2 IMPLANT
DECANTER SPIKE VIAL GLASS SM (MISCELLANEOUS) IMPLANT
DERMABOND ADVANCED (GAUZE/BANDAGES/DRESSINGS) ×1
DERMABOND ADVANCED .7 DNX12 (GAUZE/BANDAGES/DRESSINGS) ×1 IMPLANT
DEVICE DUBIN W/COMP PLATE 8390 (MISCELLANEOUS) IMPLANT
DRAPE LAPAROTOMY TRNSV 102X78 (DRAPE) ×2 IMPLANT
DRAPE UTILITY XL STRL (DRAPES) ×2 IMPLANT
DRSG TEGADERM 4X4.75 (GAUZE/BANDAGES/DRESSINGS) IMPLANT
ELECT REM PT RETURN 9FT ADLT (ELECTROSURGICAL) ×2
ELECTRODE REM PT RTRN 9FT ADLT (ELECTROSURGICAL) ×1 IMPLANT
GAUZE SPONGE 4X4 12PLY STRL LF (GAUZE/BANDAGES/DRESSINGS) IMPLANT
GLOVE BIO SURGEON STRL SZ7 (GLOVE) ×2 IMPLANT
GLOVE BIOGEL PI IND STRL 6.5 (GLOVE) ×1 IMPLANT
GLOVE BIOGEL PI IND STRL 7.5 (GLOVE) ×1 IMPLANT
GLOVE BIOGEL PI INDICATOR 6.5 (GLOVE) ×1
GLOVE BIOGEL PI INDICATOR 7.5 (GLOVE) ×1
GLOVE ECLIPSE 6.5 STRL STRAW (GLOVE) ×2 IMPLANT
GOWN STRL REUS W/ TWL LRG LVL3 (GOWN DISPOSABLE) ×2 IMPLANT
GOWN STRL REUS W/TWL LRG LVL3 (GOWN DISPOSABLE) ×2
KIT MARKER MARGIN INK (KITS) IMPLANT
NEEDLE HYPO 25X1 1.5 SAFETY (NEEDLE) ×2 IMPLANT
NS IRRIG 1000ML POUR BTL (IV SOLUTION) ×2 IMPLANT
PACK BASIN DAY SURGERY FS (CUSTOM PROCEDURE TRAY) ×2 IMPLANT
PENCIL BUTTON HOLSTER BLD 10FT (ELECTRODE) ×2 IMPLANT
SLEEVE SCD COMPRESS KNEE MED (MISCELLANEOUS) ×2 IMPLANT
SPONGE INTESTINAL PEANUT (DISPOSABLE) IMPLANT
SPONGE LAP 4X18 X RAY DECT (DISPOSABLE) ×2 IMPLANT
STRIP CLOSURE SKIN 1/2X4 (GAUZE/BANDAGES/DRESSINGS) IMPLANT
SUT MNCRL AB 4-0 PS2 18 (SUTURE) ×2 IMPLANT
SUT SILK 0 TIES 10X30 (SUTURE) IMPLANT
SUT VIC AB 3-0 SH 27 (SUTURE) ×1
SUT VIC AB 3-0 SH 27X BRD (SUTURE) ×1 IMPLANT
SYR BULB 3OZ (MISCELLANEOUS) ×2 IMPLANT
SYR CONTROL 10ML LL (SYRINGE) ×2 IMPLANT
SYRINGE 1CC 27X.5 TB SAFETYGLD (MISCELLANEOUS) IMPLANT
TOWEL OR NON WOVEN STRL DISP B (DISPOSABLE) ×2 IMPLANT
TUBE CONNECTING 20X1/4 (TUBING) ×2 IMPLANT
YANKAUER SUCT BULB TIP NO VENT (SUCTIONS) ×2 IMPLANT

## 2016-08-04 NOTE — Anesthesia Procedure Notes (Signed)
Procedure Name: LMA Insertion Date/Time: 08/04/2016 7:33 AM Performed by: Genevieve NorlanderLINKA, Vanessa Winters Pre-anesthesia Checklist: Patient identified, Emergency Drugs available, Suction available, Patient being monitored and Timeout performed Patient Re-evaluated:Patient Re-evaluated prior to inductionOxygen Delivery Method: Circle system utilized Preoxygenation: Pre-oxygenation with 100% oxygen Intubation Type: IV induction Ventilation: Mask ventilation without difficulty LMA: LMA inserted LMA Size: 4.0 Number of attempts: 1 Airway Equipment and Method: Bite block Placement Confirmation: positive ETCO2 Tube secured with: Tape Dental Injury: Teeth and Oropharynx as per pre-operative assessment

## 2016-08-04 NOTE — Discharge Instructions (Signed)
[facility] Office Phone Number [phone]   POST OP INSTRUCTIONS  Always review your discharge instruction sheet given to you by the facility where your surgery was performed.  IF YOU HAVE DISABILITY OR FAMILY LEAVE FORMS, YOU MUST BRING THEM TO THE OFFICE FOR PROCESSING.  DO NOT GIVE THEM TO YOUR DOCTOR.  1. A prescription for pain medication may be given to you upon discharge.  Take your pain medication as prescribed, if needed.  If narcotic pain medicine is not needed, then you may take acetaminophen (Tylenol) or ibuprofen (Advil) as needed. 2. Take your usually prescribed medications unless otherwise directed 3. If you need a refill on your pain medication, please contact your pharmacy.  They will contact our office to request authorization.  Prescriptions will not be filled after 5pm or on week-ends. 4. You should eat very light the first 24 hours after surgery, such as soup, crackers, pudding, etc.  Resume your normal diet the day after surgery. 5. Most patients will experience some swelling and bruising in the breast.  Ice packs and a good support bra will help.  Swelling and bruising can take several days to resolve.  6. It is common to experience some constipation if taking pain medication after surgery.  Increasing fluid intake and taking a stool softener will usually help or prevent this problem from occurring.  A mild laxative (Milk of Magnesia or Miralax) should be taken according to package directions if there are no bowel movements after 48 hours. 7. Unless discharge instructions indicate otherwise, you may remove your bandages 24-48 hours after surgery, and you may shower at that time.  You may have steri-strips (small skin tapes) in place directly over the incision.  These strips should be left on the skin for 7-10 days.  If your surgeon used skin glue on the incision, you may shower in 24 hours.  The glue will flake off over the next 2-3 weeks.  Any sutures or  staples will be removed at the office during your follow-up visit. 8. ACTIVITIES:  You may resume regular daily activities (gradually increasing) beginning the next day.  Wearing a good support bra or sports bra minimizes pain and swelling.  You may have sexual intercourse when it is comfortable. a. You may drive when you no longer are taking prescription pain medication, you can comfortably wear a seatbelt, and you can safely maneuver your car and apply brakes. b. RETURN TO WORK:  ______________________________________________________________________________________ 9. You should see your doctor in the office for a follow-up appointment approximately two weeks after your surgery.  Your doctors nurse will typically make your follow-up appointment when she calls you with your pathology report.  Expect your pathology report 2-3 business days after your surgery.  You may call to check if you do not hear from us after three days. 10. OTHER INSTRUCTIONS: _______________________________________________________________________________________________ _____________________________________________________________________________________________________________________________________ _____________________________________________________________________________________________________________________________________ _____________________________________________________________________________________________________________________________________  WHEN TO CALL YOUR DOCTOR: 1. Fever over 101.0 2. Nausea and/or vomiting. 3. Extreme swelling or bruising. 4. Continued bleeding from incision. 5. Increased pain, redness, or drainage from the incision.  The clinic staff is available to answer your questions during regular business hours.  Please dont hesitate to call and ask to speak to one of the nurses for clinical concerns.  If you have a medical emergency, go to the nearest emergency room or call 911.  [facility] is always on call at the hospital.  For further questions, please visit [email or web address]  Post Anesthesia Home Care Instructions ° °Activity: °Get plenty of rest for the remainder of the day. A responsible individual must stay with you for 24 hours following the procedure.  °For the next 24 hours, DO NOT: °-Drive a car °-Operate machinery °-Drink alcoholic beverages °-Take any medication unless instructed by your physician °-Make any legal decisions or sign important papers. ° °Meals: °Start with liquid foods such as gelatin or soup. Progress to regular foods as tolerated. Avoid greasy, spicy, heavy foods. If nausea and/or vomiting occur, drink only clear liquids until the nausea and/or vomiting subsides. Call your physician if vomiting continues. ° °Special Instructions/Symptoms: °Your throat may feel dry or sore from the anesthesia or the breathing tube placed in your throat during surgery. If this causes discomfort, gargle with warm salt water. The discomfort should disappear within 24 hours. ° °If you had a scopolamine patch placed behind your ear for the management of post- operative nausea and/or vomiting: ° °1. The medication in the patch is effective for 72 hours, after which it should be removed.  Wrap patch in a tissue and discard in the trash. Wash hands thoroughly with soap and water. °2. You may remove the patch earlier than 72 hours if you experience unpleasant side effects which may include dry mouth, dizziness or visual disturbances. °3. Avoid touching the patch. Wash your hands with soap and water after contact with the patch. °  ° °

## 2016-08-04 NOTE — H&P (Signed)
History of Present Illness The patient is a 21 year old female who presents with a complaint of nipple discharge.  PCP - Dr. [name] GYN - [name] Referred by Dr. [name] for right nipple discharge.   This is a healthy 21 year old female who presents with a three-month history of spontaneous whitish nipple discharge. This drainage seems to occur as a single orifice located about 10:00 just adjacent to the center of her nipple. She denies any pain associated with this. The patient has a strong family history of breast cancer. She underwent ultrasound of the right breast that showed a benign 1.6 cm cyst in the lower outer subareolar right breast. No other findings are noted. We examined her a few weeks ago and ordered a breast MRI. This also did not show any suspicious findings. She continues to have a noticeable amount of discharge on a daily basis. The drainage is darker. It is no longer white but seems to be a lighter shade of brown  Menarche - 10 Nulliparous Hormones - Nuvaring Family history- Mother - 26 - DCIS ER-; lumpectomy/ XRT Maternal grandmother - 72 - IDC ER+; lumpectomy/ SLNB; chemo/ XRT; recurrence at age 41 - deceased    CLINICAL DATA: 21 year old presenting with an approximate two-month history of spontaneous right nipple discharge from a single duct orifice, white in color. She has a strong family history of breast cancer in her mother (age 36) and in her maternal grandmother (age early 50s).  EXAM: ULTRASOUND OF THE RIGHT BREAST  COMPARISON: None.  FINDINGS: On physical exam, we are able to express a white discharge from a single duct orifice at the approximate 10 o'clock position of the right nipple.  Targeted subareolar right breast ultrasound is performed, showing an oval circumscribed parallel anechoic mass with excellent acoustic enhancement and no internal power Doppler flow at the 7 o'clock position approximately 1 cm from  the nipple measuring approximately 0.9 x 1.6 x 1.4 cm. No suspicious solid mass, intraductal masses or abnormal acoustic shadowing is identified.  IMPRESSION: 1. Benign 1.6 cm cyst in the lower outer subareolar right breast. 2. No abnormalities are identified to explain the patient's single duct right nipple discharge.  RECOMMENDATION: 1. Surgical consultation and possible breast MRI for further evaluation of the single duct right nipple discharge. We will arrange the surgical consultation for the patient and she will be contacted with this information by the nurse at the [facility]. 2. Consider genetic counseling and possible genetic testing given her strong family history. 3. The patient should begin annual screening mammography at age 3 (10 years younger than the age of diagnosis of her mother).  I have discussed the findings and recommendations with the patient. Results were also provided in writing at the conclusion of the visit.  BI-RADS CATEGORY 2: Benign.   Electronically Signed By: [name] M.D. On: 05/06/2016 16:50  CLINICAL DATA: Right nipple discharge. Strong family history of breast cancer including mother at age 56 and maternal great aunt at age 60.  LABS: Not applicable  EXAM: BILATERAL BREAST MRI WITH AND WITHOUT CONTRAST  TECHNIQUE: Multiplanar, multisequence MR images of both breasts were obtained prior to and following the intravenous administration of well ml of MultiHance.  THREE-DIMENSIONAL MR IMAGE RENDERING ON INDEPENDENT WORKSTATION:  Three-dimensional MR images were rendered by post-processing of the original MR data on an independent workstation. The three-dimensional MR images were interpreted, and findings are reported in the following complete MRI report for this study.  Three dimensional images were evaluated at the independent DynaCad workstation  COMPARISON: Previous  exam(s).  FINDINGS: Breast composition: d. Extreme fibroglandular tissue.  Background parenchymal enhancement: Moderate.  Right breast: No suspicious enhancing mass, non-mass enhancement or secondary signs of malignancy. Scattered benign cysts.  Left breast: No suspicious enhancing mass, non-mass enhancement or secondary signs of malignancy. Scattered benign cysts.  Lymph nodes: No abnormal appearing lymph nodes.  Ancillary findings: None.  IMPRESSION: No evidence of malignancy within either breast.  RECOMMENDATION: 1. Clinical follow-up for the right nipple discharge. 2. Annual screening mammography starting at age 21 (10 years younger than the age of diagnosis of her mother mother), as also recommended on the earlier ultrasound report. 3. Would also consider the addition of annual screening breast MRI to annual screening mammography starting at age 21, given the strong family history and extremely dense breasts. Per American Cancer Society guidelines, annual screening MRI of the breasts is recommended if a risk assessment calculation for breast cancer, preferably using the Tyrer-Cuzick model, measures greater than 20%.  BI-RADS CATEGORY 2: Benign.   Electronically Signed By: Bary RichardStan Maynard M.D. On: 06/01/2016 15:32     Physical Exam   The physical exam findings are as follows: Note:WDWN in NAD Eyes: Pupils equal, round; sclera anicteric HENT: Oral mucosa moist; good dentition Neck: No masses palpated, no thyromegaly Lungs: CTA bilaterally; normal respiratory effort Breasts: symmetric; no axillary lymphadenopathy; no dominant masses; normal left nipple; right nipple - no retraction; able to express thick whitish-tan drainage without odor from nipple duct orifice at 10:00 CV: Regular rate and rhythm; no murmurs; extremities well-perfused with no edema Abd: +bowel sounds, soft, non-tender, no palpable organomegaly; no palpable hernias Skin:  Warm, dry; no sign of jaundice Psychiatric - alert and oriented x 4; calm mood and affect    Assessment & Plan   FAMILY HISTORY OF BREAST CANCER (Z80.3)  DISCHARGE FROM RIGHT NIPPLE (N64.52)  Current Plans Schedule for Surgery - right nipple duct excision. The surgical procedure has been discussed with the patient. Potential risks, benefits, alternative treatments, and expected outcomes have been explained. All of the patient's questions at this time have been answered. We discussed the possibility of some numbness at the nipple that may last for a couple of years. We also discussed the risk of other duct orifices having drainage. The likelihood of reaching the patient's treatment goal is good. The patient understand the proposed surgical procedure and wishes to proceed. Note:At this point, there are not any further studies that would be beneficial and working up this problem. Our options really are observation with reimaging in 6 months versus nipple duct excision. The patient is obviously concerned because of her strong family history and would like to proceed with nipple duct excision.  Wilmon ArmsMatthew K. Corliss Skainssuei, MD, [facility]  General/ Trauma Surgery  08/04/2016 7:04 AM

## 2016-08-04 NOTE — Op Note (Signed)
Preop diagnosis: Persistent right nipple discharge Postop diagnosis: Same Procedure performed: Right nipple duct excision Surgeon:Tracer Gutridge K. Anesthesia: Gen. Indications: This is a 21 year old female who presents with a 5 month history of persistent right nipple discharge. Initially the drainage appeared to be white and milky. However the color has changed to a darker tan or gray color. She has been evaluated with an MRI that was unremarkable.  A second adjacent duct orifices also begun draining recently. She presents now for duct excision. The patient also has a 1.6 cm cyst in the lower outer portion of her retroareolar tissue.  Description of procedure: The patient is brought to the operating room and placed in the supine position on the operating room table. After an adequate level general anesthesia was obtained, her right breast was prepped with ChloraPrep and draped sterile fashion. A timeout was taken to ensure the proper patient and proper procedure. We examined the right nipple. Were able to express some drainage from to immediately adjacent duct orifice ease. These are in the central lateral part of her nipple. Is able to cannulate one of these duct orifice ease with a lacrimal duct probe. I made a partial circumareolar incision around the lateral side of the nipple. We dissected posterior to the nipple until we encountered the lacrimal duct probe. We amputated the duct on the posterior surface of the nipple. Using this point as our apex excised a cone of breast tissue extending about 2.5 cm deep with a diameter of about 2 cm. The inferolateral part of this excision included the previously described cyst. No other masses were palpated within the wound. The specimen was removed and oriented with a paint kit. This was sent for pathologic examination. The wound was then irrigated and inspected for hemostasis. We closed with 3-0 Vicryl and a subcuticular layer of 4-0 Monocryl. The patient has skin  sensitivity to Steri-Strips and we used Dermabond. After the Dermabond dried patient was extubated and brought to the recovery room in stable condition. All sponge, initially, and needle counts are correct.  Vanessa Winters. Georgette Dover, MD, [facility]  General/ Trauma Surgery  08/04/2016 8:26 AM

## 2016-08-04 NOTE — Transfer of Care (Signed)
Immediate Anesthesia Transfer of Care Note  Patient: Vanessa LimerickRachel Paiz  Procedure(s) Performed: Procedure(s): RIGHT NIPPLE DUCT EXCISION (Right)  Patient Location: PACU  Anesthesia Type:General  Level of Consciousness: sedated  Airway & Oxygen Therapy: Patient Spontanous Breathing and Patient connected to face mask oxygen  Post-op Assessment: Report given to RN and Post -op Vital signs reviewed and stable  Post vital signs: Reviewed and stable  Last Vitals:  Vitals:   08/04/16 0634  BP: 109/65  Pulse: 70  Resp: 18  Temp: 36.8 C    Last Pain:  Vitals:   08/04/16 0634  TempSrc: Oral  PainSc:          Complications: No apparent anesthesia complications

## 2016-08-05 ENCOUNTER — Encounter (HOSPITAL_BASED_OUTPATIENT_CLINIC_OR_DEPARTMENT_OTHER): Payer: Self-pay | Admitting: Surgery

## 2016-08-05 NOTE — Anesthesia Postprocedure Evaluation (Signed)
Anesthesia Post Note  Patient: Vanessa LimerickRachel Winters  Procedure(s) Performed: Procedure(s) (LRB): RIGHT NIPPLE DUCT EXCISION (Right)     Patient location during evaluation: PACU Anesthesia Type: General Level of consciousness: awake and alert Pain management: pain level controlled Vital Signs Assessment: post-procedure vital signs reviewed and stable Respiratory status: spontaneous breathing, nonlabored ventilation, respiratory function stable and patient connected to nasal cannula oxygen Cardiovascular status: blood pressure returned to baseline and stable Postop Assessment: no signs of nausea or vomiting Anesthetic complications: no    Last Vitals:  Vitals:   08/04/16 0832 08/04/16 0923  BP:  135/68  Pulse: 66 99  Resp: 12 18  Temp:  36.6 C    Last Pain:  Vitals:   08/05/16 0933  TempSrc:   PainSc: 1                  Yoni Lobos EDWARD

## 2017-01-05 ENCOUNTER — Ambulatory Visit
Admission: RE | Admit: 2017-01-05 | Discharge: 2017-01-05 | Disposition: A | Discharge status: Home / Self Care | Payer: No Typology Code available for payment source | Source: Ambulatory Visit | Attending: Family Medicine | Admitting: Family Medicine

## 2017-01-05 ENCOUNTER — Other Ambulatory Visit: Payer: Self-pay | Admitting: Family Medicine

## 2017-01-05 DIAGNOSIS — R1084 Generalized abdominal pain: Secondary | ICD-10-CM

## 2017-01-05 IMAGING — DX DG ABDOMEN 2V
2 series · 2 of 2 positions shown · non-contrast
Comparison: [DATE] radiographs

CLINICAL DATA: Acute abdominal pain with bloody stools and vomiting
for 2 weeks.

EXAM:
ABDOMEN - 2 VIEW

[dg abd 2 views (1 of 2)]
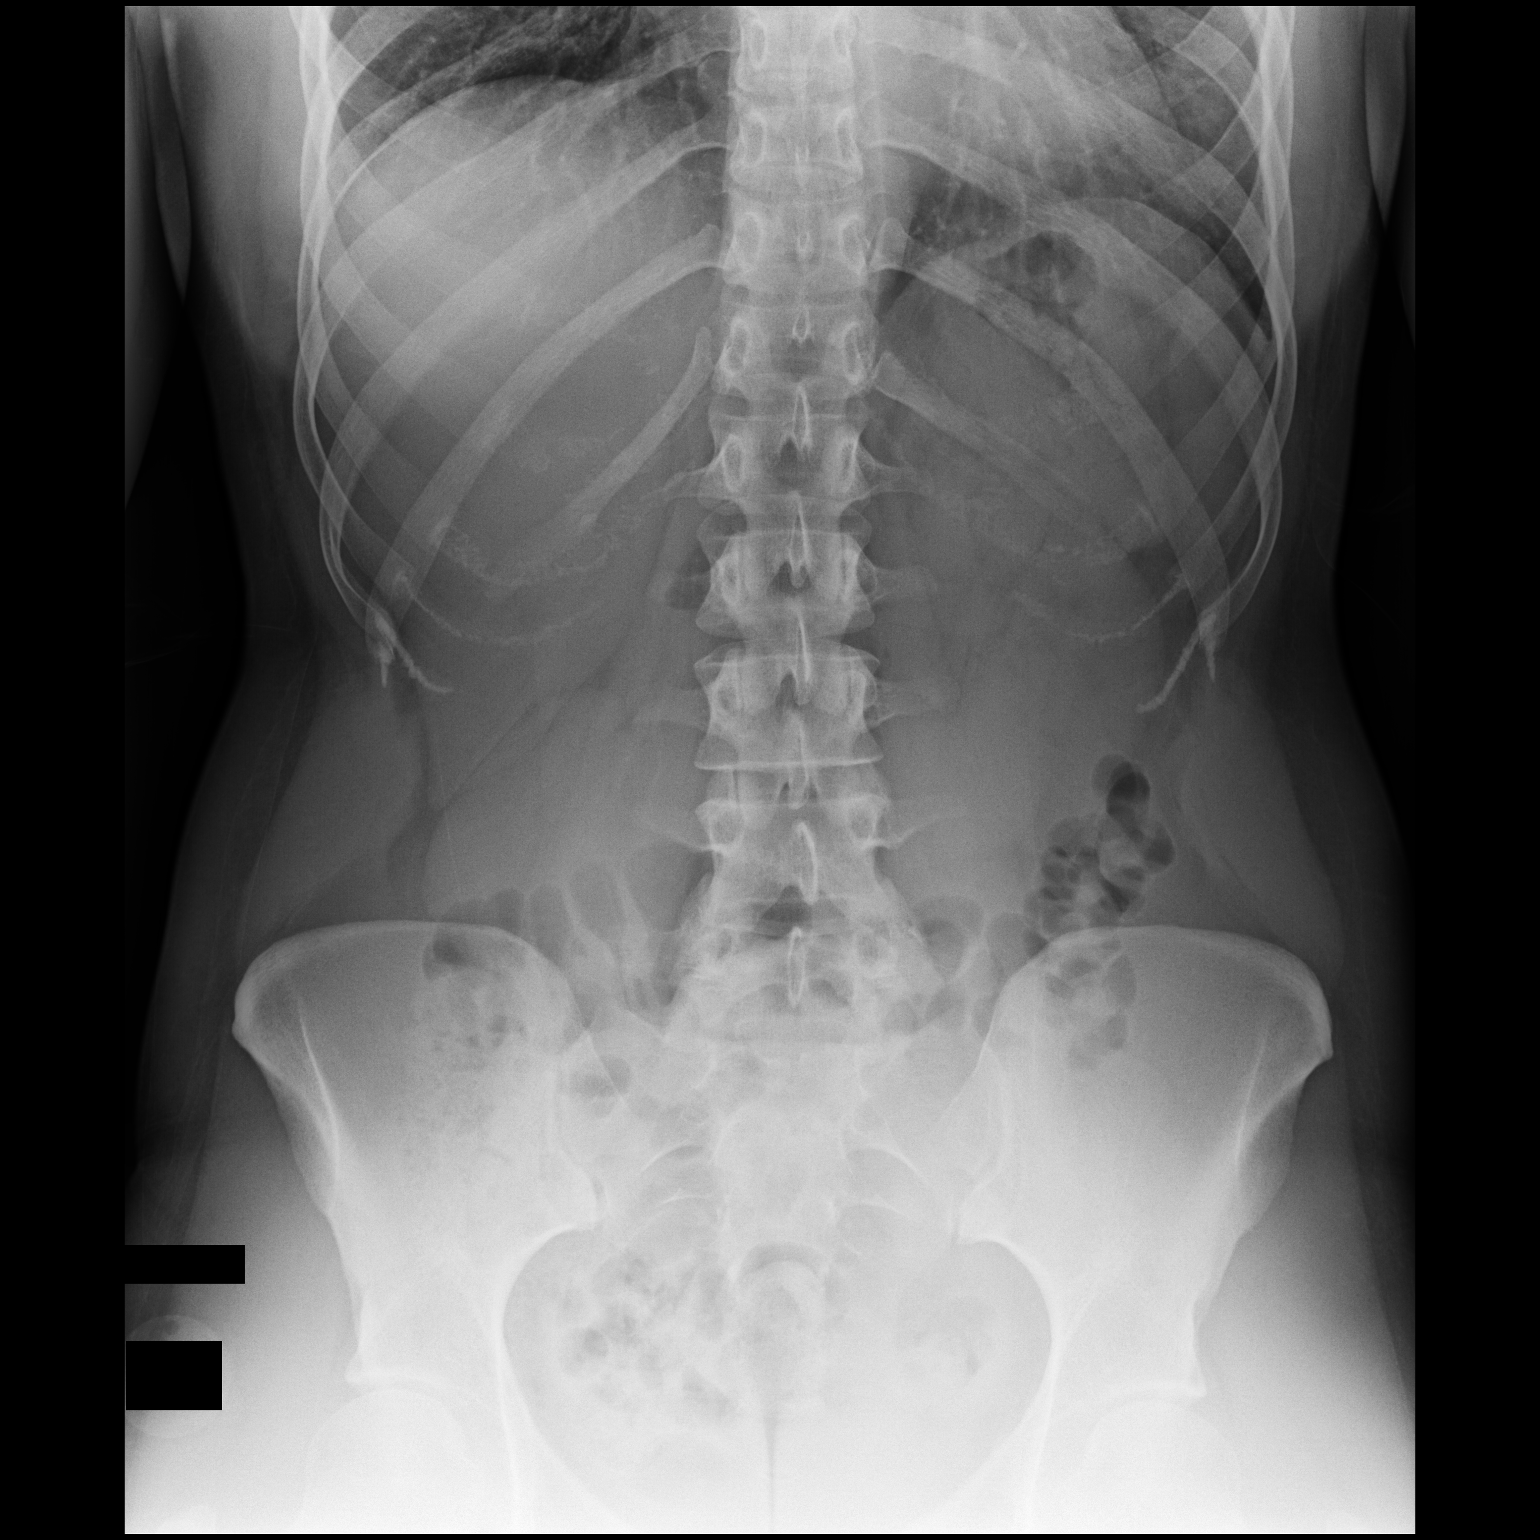

[dg abd 2 views (2 of 2)]
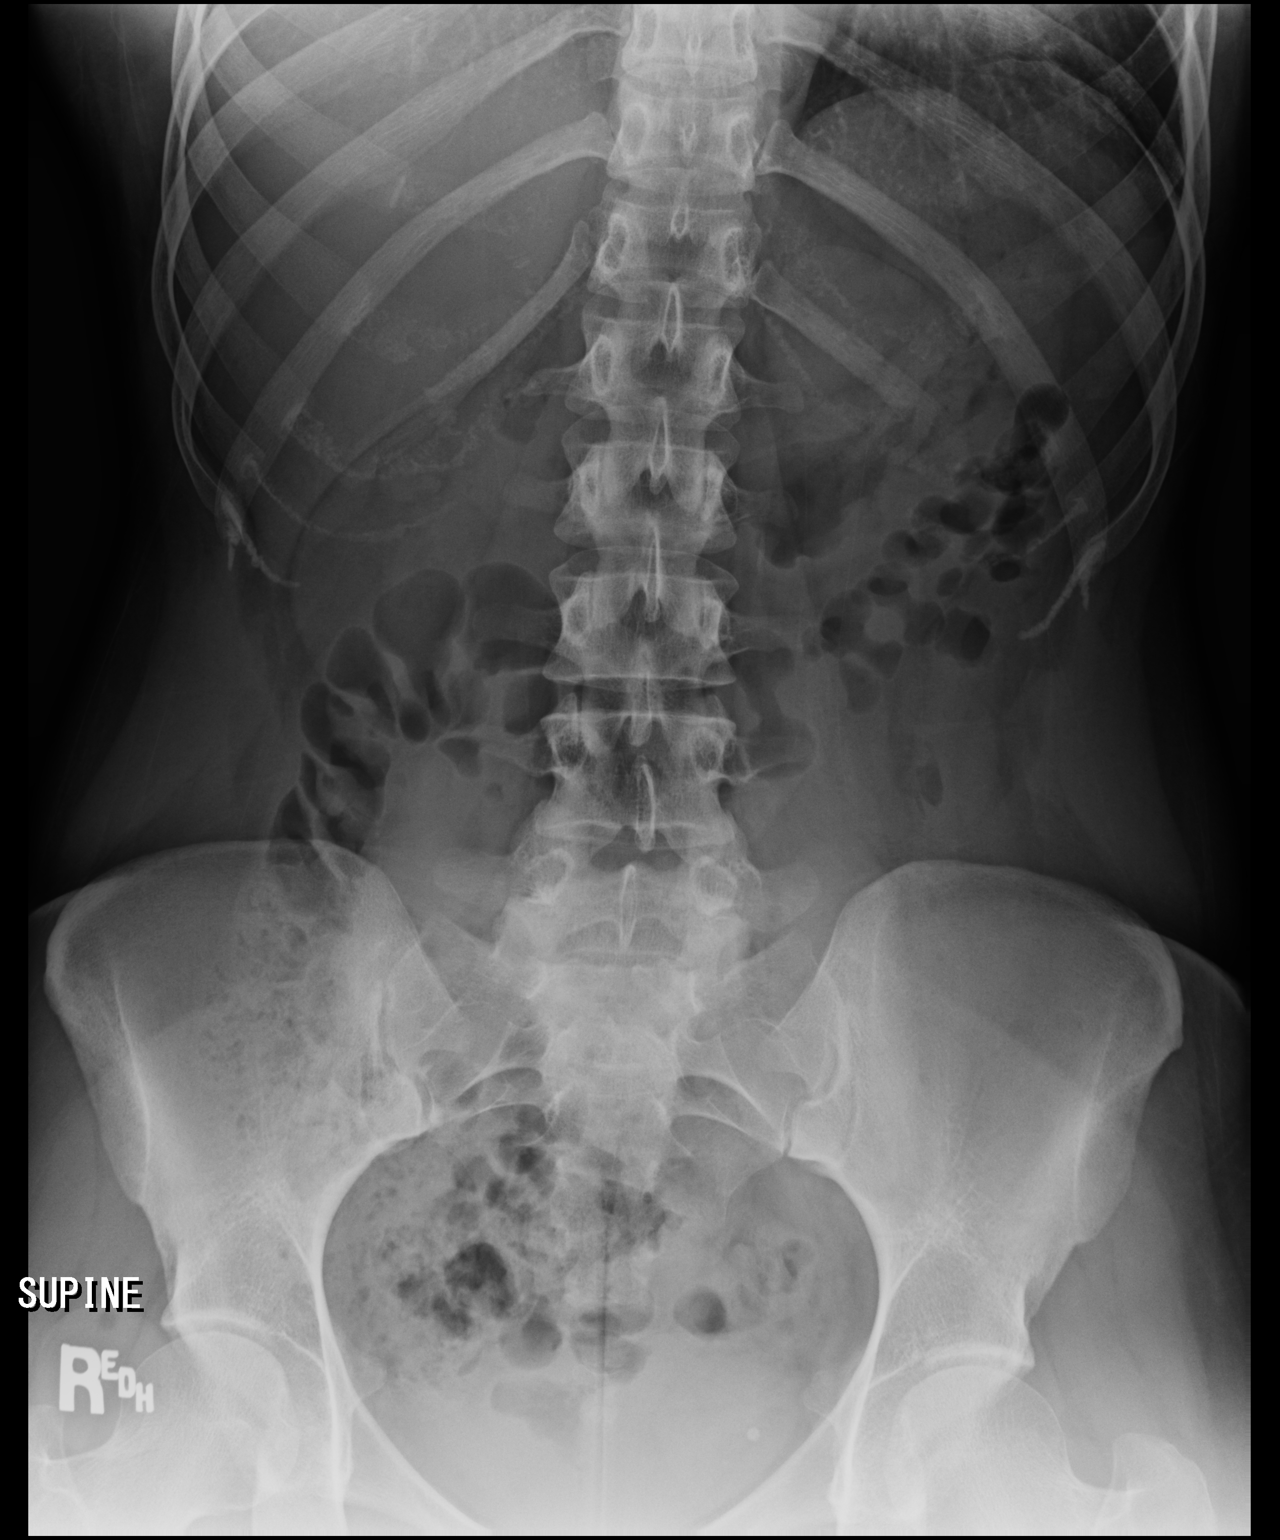

[2 of 2 positions shown; findings below may reference images not displayed]

FINDINGS: The bowel gas pattern is normal. There is no evidence of free air.
No radio-opaque calculi or other significant radiographic
abnormality is seen.
IMPRESSION: Negative.

## 2017-01-06 ENCOUNTER — Other Ambulatory Visit: Payer: Self-pay | Admitting: Family Medicine

## 2017-01-06 DIAGNOSIS — R1084 Generalized abdominal pain: Secondary | ICD-10-CM

## 2017-01-06 DIAGNOSIS — K625 Hemorrhage of anus and rectum: Secondary | ICD-10-CM

## 2017-01-06 DIAGNOSIS — R112 Nausea with vomiting, unspecified: Secondary | ICD-10-CM

## 2017-01-09 ENCOUNTER — Ambulatory Visit
Admission: RE | Admit: 2017-01-09 | Discharge: 2017-01-09 | Disposition: A | Discharge status: Home / Self Care | Payer: No Typology Code available for payment source | Source: Ambulatory Visit | Attending: Family Medicine | Admitting: Family Medicine

## 2017-01-09 DIAGNOSIS — K625 Hemorrhage of anus and rectum: Secondary | ICD-10-CM

## 2017-01-09 DIAGNOSIS — R1084 Generalized abdominal pain: Secondary | ICD-10-CM

## 2017-01-09 DIAGNOSIS — R112 Nausea with vomiting, unspecified: Secondary | ICD-10-CM

## 2017-01-09 IMAGING — CT CT ABD-PELV W/ CM
2 of 4 series · 10 of 36 positions shown, 17 images · IV contrast (WATER & [ID] ISOVUE 300)
Comparison: Abdominal radiographs [DATE]. Lumbar MRI
[DATE].

CLINICAL DATA: 21 year old female with mid abdominal pain,
cramping, loss of appetite and vomiting for 2 weeks.

EXAM:
CT ABDOMEN AND PELVIS WITH CONTRAST
TECHNIQUE: Multidetector CT imaging of the abdomen and pelvis was performed
using the standard protocol following bolus administration of
intravenous contrast.
CONTRAST:  100mL [UH] IOPAMIDOL ([UH]) INJECTION 61%

[Series 3: abd/pelvis with · axial · 0.60mm/px · z∈[-381,-71]mm · 9 of 78 slices shown, 15 images]
[im 8/78  soft-tissue]
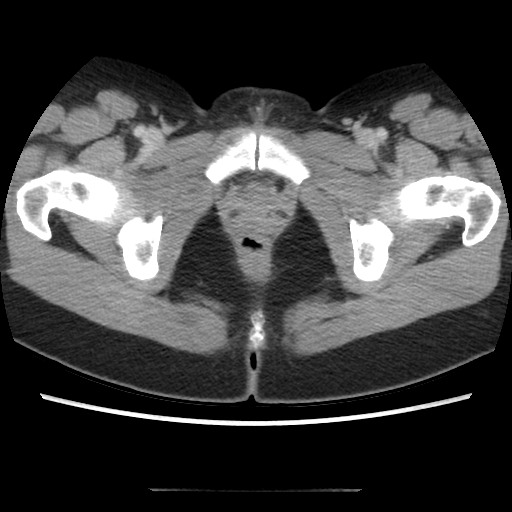
[im 8/78  bone]
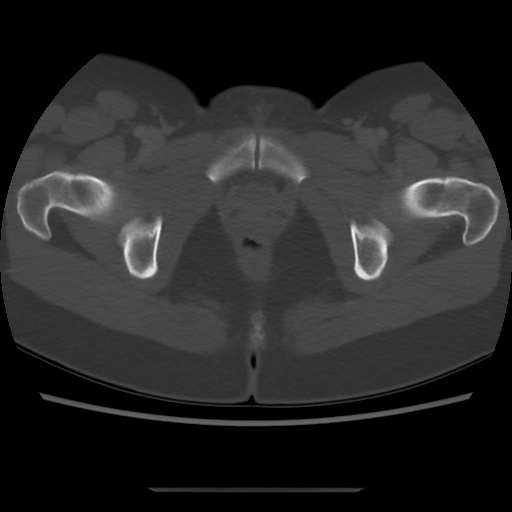
[im 16/78  soft-tissue]
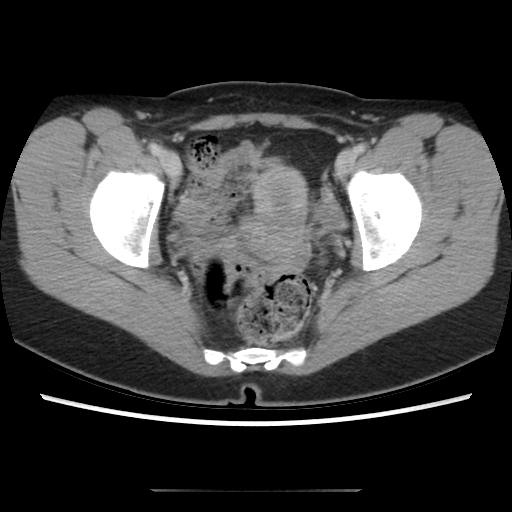
[im 24/78  soft-tissue]
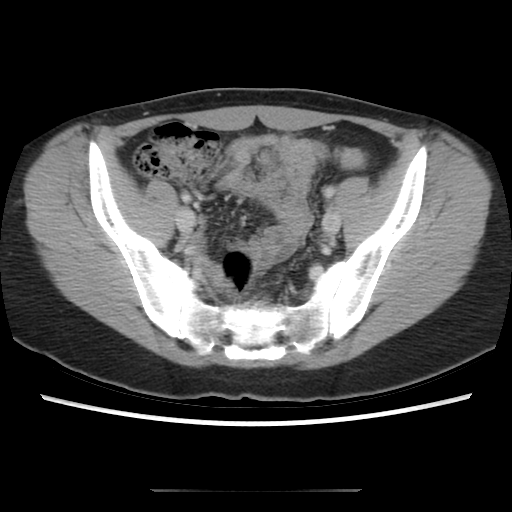
[im 31/78  soft-tissue]
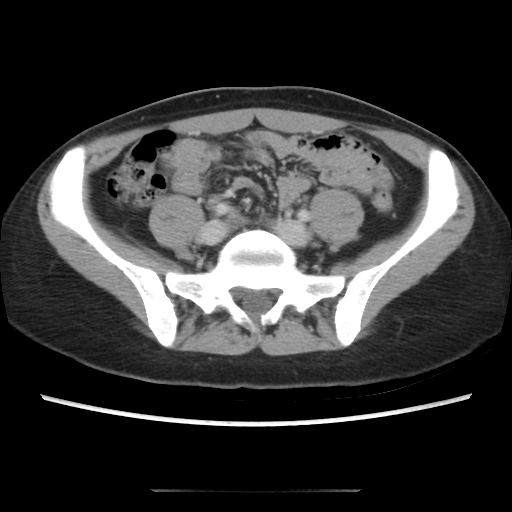
[im 39/78  soft-tissue]
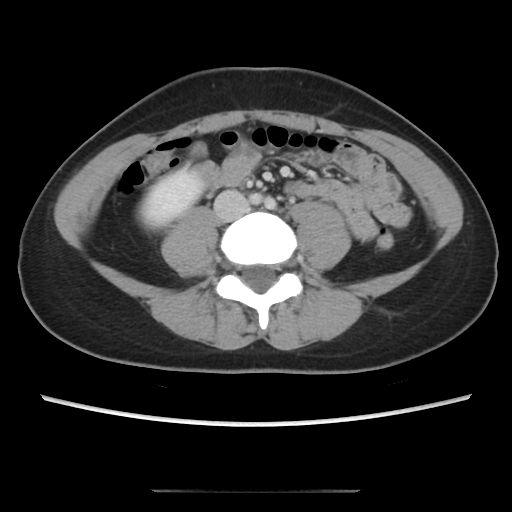
[im 47/78  soft-tissue]
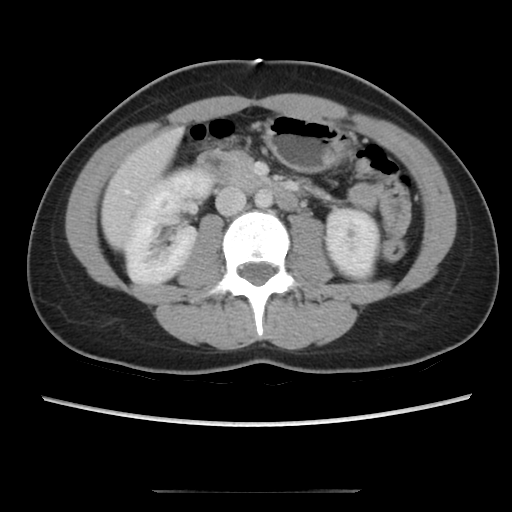
[im 47/78  lung]
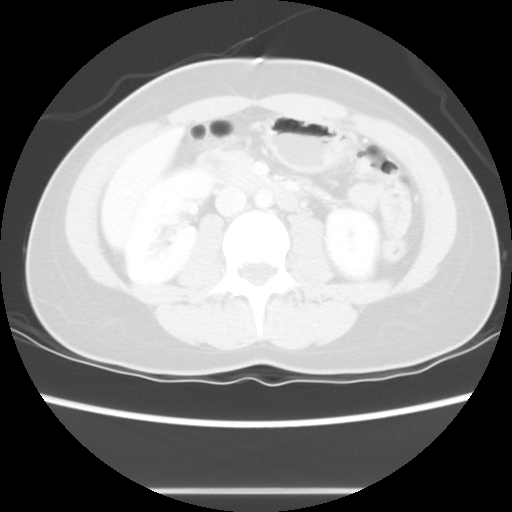
[im 54/78  soft-tissue]
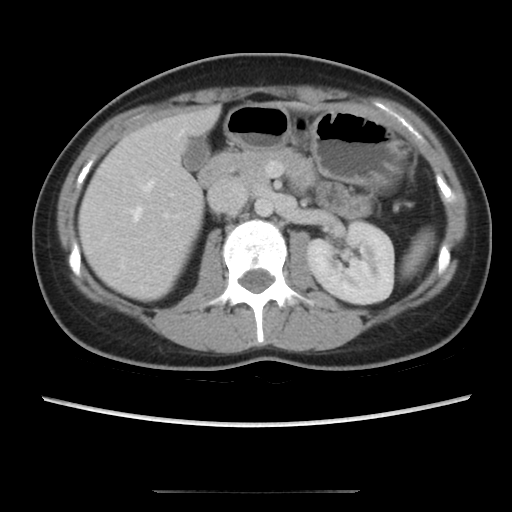
[im 54/78  lung]
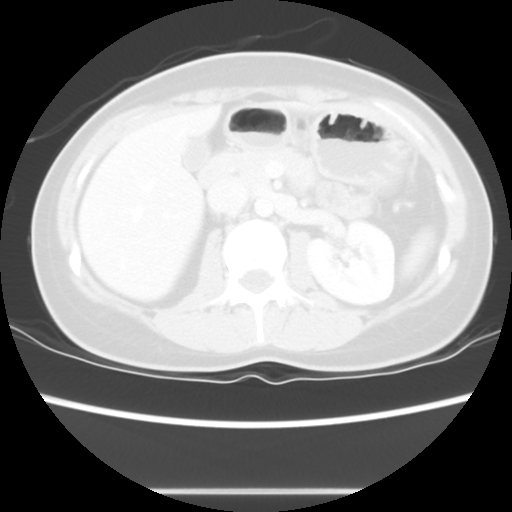
[im 62/78  soft-tissue]
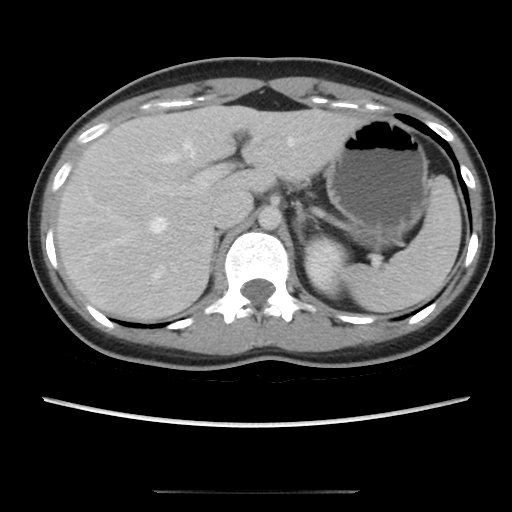
[im 62/78  lung]
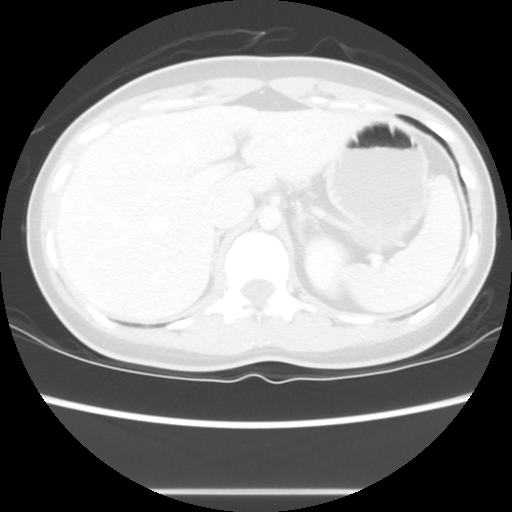
[im 70/78  soft-tissue]
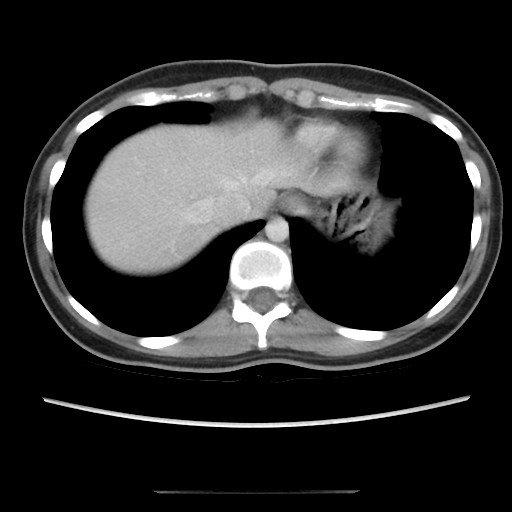
[im 70/78  lung]
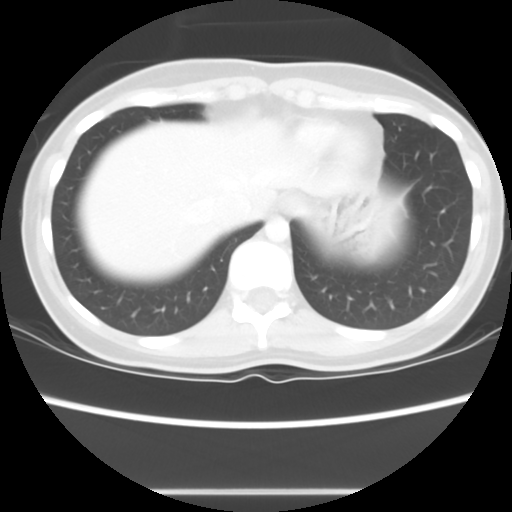
[im 70/78  bone]
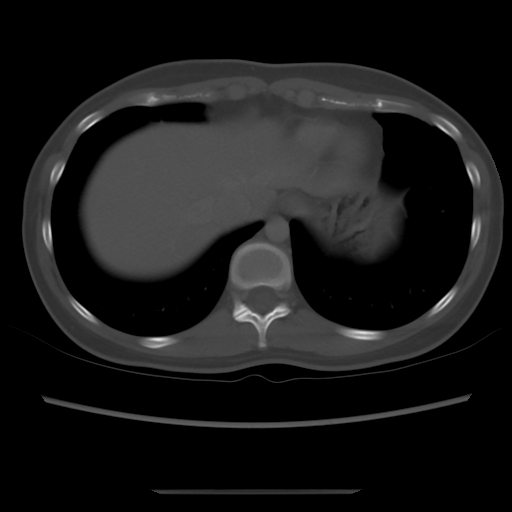

[Series 601: coronal body · coronal · 0.82mm/px · 1 of 96 slices shown, 2 images]
[im 32/96  soft-tissue]
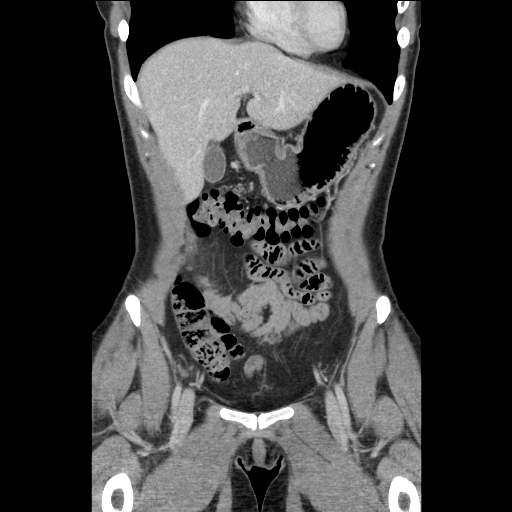
[im 32/96  bone]
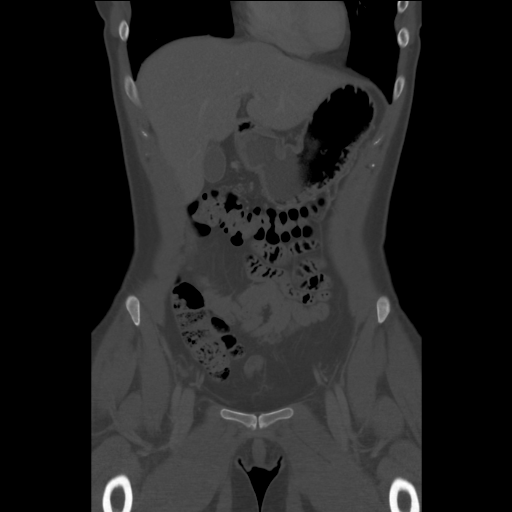

[10 of 36 positions shown; findings below may reference images not displayed]

FINDINGS: Lower chest: Normal.  No pericardial or pleural effusion.

Hepatobiliary: Normal liver and gallbladder.  No dilated bile ducts.

Pancreas: Normal.

Spleen: Normal.

Adrenals/Urinary Tract: Normal adrenal glands.

Bilateral renal enhancement is within normal limits. No perinephric
stranding. No nephrolithiasis or hydronephrosis. Horizontal axis of
the right kidney (normal variant series 3, image 34). Both proximal
ureters appear normal. The distal ureters are difficult to
delineate. There is a solitary left hemipelvis phlebolith on series
3, image 67. There is no calculus identified along the course of
either ureter.

Diminutive and unremarkable urinary bladder.

Stomach/Bowel: Mild to moderate volume of retained stool in the
rectum and distal sigmoid colon. Redundant sigmoid otherwise is
decompressed. Decompressed left colon. Mildly indistinct appearance
of the distal left colon (coronal image 37), but no mesenteric
inflammation. Splenic flexure and transverse colon are within normal
limits. Right colon and cecum appear normal. Normal appendix on
coronal image 44.

No dilated or abnormal small bowel loops. Negative stomach and
duodenum.

No abdominal free fluid.

Vascular/Lymphatic: Major arterial structures in the abdomen and
pelvis appear normal. Portal venous system appears patent. Central
venous structures appear patent. No lymphadenopathy.

Reproductive: Negative.

Other: No pelvic free fluid.

Musculoskeletal: Normal.
IMPRESSION: 1. Questionable mild colitis of the distal descending colon and
proximal sigmoid, but the appearance might simply be artifact due to
underdistention.
2. Otherwise negative CT Abdomen and Pelvis.

## 2017-01-09 MED ORDER — IOPAMIDOL (ISOVUE-300) INJECTION 61%
100.0000 mL | Freq: Once | INTRAVENOUS | Status: AC | PRN
  Administered 2017-01-09: 100 mL via INTRAVENOUS

## 2017-01-16 ENCOUNTER — Other Ambulatory Visit: Payer: No Typology Code available for payment source

## 2017-01-26 LAB — HM COLONOSCOPY

## 2017-06-02 MED FILL — PANTOPRAZOLE SOD DR 40 MG T: 40 | 60 days supply | Qty: 120 | Fill #0

## 2017-06-02 MED FILL — ESOMEPRAZOLE MAG DR 40 MG C: 40 | 30 days supply | Qty: 60 | Fill #0

## 2017-06-02 MED FILL — SUMATRIPTAN SUCC 100 MG TAB: 100 | 30 days supply | Qty: 9 | Fill #0

## 2017-06-06 ENCOUNTER — Other Ambulatory Visit: Payer: Self-pay | Admitting: Surgery

## 2017-06-06 DIAGNOSIS — N6452 Nipple discharge: Secondary | ICD-10-CM | POA: Diagnosis not present

## 2017-06-13 ENCOUNTER — Ambulatory Visit
Admission: RE | Admit: 2017-06-13 | Discharge: 2017-06-13 | Disposition: A | Discharge status: Home / Self Care | Payer: No Typology Code available for payment source | Source: Ambulatory Visit | Attending: Surgery | Admitting: Surgery

## 2017-06-13 DIAGNOSIS — N6489 Other specified disorders of breast: Secondary | ICD-10-CM | POA: Diagnosis not present

## 2017-06-13 DIAGNOSIS — N6452 Nipple discharge: Secondary | ICD-10-CM

## 2017-07-13 DIAGNOSIS — N6452 Nipple discharge: Secondary | ICD-10-CM | POA: Diagnosis not present

## 2017-07-13 DIAGNOSIS — N6091 Unspecified benign mammary dysplasia of right breast: Secondary | ICD-10-CM | POA: Diagnosis not present

## 2017-07-13 DIAGNOSIS — N6041 Mammary duct ectasia of right breast: Secondary | ICD-10-CM | POA: Diagnosis not present

## 2017-07-13 DIAGNOSIS — N63 Unspecified lump in unspecified breast: Secondary | ICD-10-CM | POA: Diagnosis not present

## 2017-07-13 DIAGNOSIS — N6011 Diffuse cystic mastopathy of right breast: Secondary | ICD-10-CM | POA: Diagnosis not present

## 2017-07-13 DIAGNOSIS — Z803 Family history of malignant neoplasm of breast: Secondary | ICD-10-CM | POA: Diagnosis not present

## 2017-07-18 ENCOUNTER — Ambulatory Visit (INDEPENDENT_AMBULATORY_CARE_PROVIDER_SITE_OTHER): Payer: 59 | Admitting: Sports Medicine

## 2017-07-18 DIAGNOSIS — N6452 Nipple discharge: Secondary | ICD-10-CM | POA: Diagnosis not present

## 2017-07-18 MED ORDER — DOXYCYCLINE HYCLATE 100 MG PO TABS: 100.0000 mg | ORAL_TABLET | Freq: Two times a day (BID) | ORAL | 0 refills | Status: DC

## 2017-07-18 MED ORDER — ONDANSETRON 8 MG PO TBDP: 8.0000 mg | ORAL_TABLET | Freq: Three times a day (TID) | ORAL | 3 refills | Status: DC | PRN

## 2017-07-18 MED FILL — DOXYCYCLINE HYCLATE 100 MG: 100 | 14 days supply | Qty: 28 | Fill #0

## 2017-07-18 MED FILL — ONDANSETRON ODT 8 MG TABLET: 8 | 7 days supply | Qty: 20 | Fill #0

## 2017-07-18 NOTE — Progress Notes (Signed)
Subjective:    CC: Breast discharge  HPI:  Vanessa Winters is a pleasant 22 year old female, she is a Engineer, civil (consulting) in our office, for a long time now she is a discharge from her right breast, she is post removal of 1 of her ducts.  Unfortunately has continued to have tenderness, nodularity at the 12 o'clock position over the right nipple, and seropurulent type discharge.  Never had cultures done, she did have a course of Septra without any improvement.  Symptoms are moderate, persistent, most recent ultrasound showed possible sebaceous cyst, she also had a breast MRI sometime ago last year that showed no evidence of malignancy.  Symptoms are moderate, persistent.  No fevers or chills, on further questioning she does have some headaches, visual changes.  I reviewed the past medical history, family history, social history, surgical history, and allergies today and no changes were needed.  Please see the problem list section below in epic for further details.  Past Medical History: Past Medical History:  Diagnosis Date  . Discharge from right nipple 07/2016  . GERD (gastroesophageal reflux disease)   . Migraines   . Seasonal allergies    Past Surgical History: Past Surgical History:  Procedure Laterality Date  . BREAST DUCTAL SYSTEM EXCISION Right 08/04/2016   Procedure: RIGHT NIPPLE DUCT EXCISION;  Surgeon: Manus Rudd, MD;  Location: [facility];  Service: General;  Laterality: Right;  . WISDOM TOOTH EXTRACTION     Social History: Social History   Socioeconomic History  . Marital status: Married    Spouse name: Not on file  . Number of children: Not on file  . Years of education: Not on file  . Highest education level: Not on file  Occupational History  . Not on file  Social Needs  . Financial resource strain: Not on file  . Food insecurity:    Worry: Not on file    Inability: Not on file  . Transportation needs:    Medical: Not on file    Non-medical: Not on file  Tobacco  Use  . Smoking status: Current Every Day Smoker    Years: 3.00    Types: Cigarettes  . Smokeless tobacco: Never Used  . Tobacco comment: 4 cig./day  Substance and Sexual Activity  . Alcohol use: Yes    Comment: weekends  . Drug use: No  . Sexual activity: Not on file  Lifestyle  . Physical activity:    Days per week: Not on file    Minutes per session: Not on file  . Stress: Not on file  Relationships  . Social connections:    Talks on phone: Not on file    Gets together: Not on file    Attends religious service: Not on file    Active member of club or organization: Not on file    Attends meetings of clubs or organizations: Not on file    Relationship status: Not on file  Other Topics Concern  . Not on file  Social History Narrative  . Not on file   Family History: Family History  Problem Relation Age of Onset  . Cancer Mother   . Hyperlipidemia Father    Allergies: Allergies  Allergen Reactions  . Adhesive [Tape] Other (See Comments)    STERI-STRIPS:  BLISTERS   Medications: See med rec.  Review of Systems: No headache, visual changes, nausea, vomiting, diarrhea, constipation, dizziness, abdominal pain, skin rash, fevers, chills, night sweats, swollen lymph nodes, weight loss, chest pain, body aches,  joint swelling, muscle aches, shortness of breath, mood changes, visual or auditory hallucinations.  Objective:    General: Well Developed, well nourished, and in no acute distress.  Neuro: Alert and oriented x3, extra-ocular muscles intact, sensation grossly intact.  HEENT: Normocephalic, atraumatic, pupils equal round reactive to light, neck supple, no masses, no lymphadenopathy, thyroid nonpalpable.  Skin: Warm and dry, no rashes noted.  Cardiac: Regular rate and rhythm, no murmurs rubs or gallops.  Respiratory: Clear to auscultation bilaterally. Not using accessory muscles, speaking in full sentences.  Abdominal: Soft, nontender, nondistended, positive bowel  sounds, no masses, no organomegaly.  Musculoskeletal: Shoulder, elbow, wrist, hip, knee, ankle stable, and with full range of motion. Right breast examined with 2 female chaperone's, at the 11 to 12 o'clock position there does appear to be a fistula, patient is able to express seropurulent discharge, after cleaning the areola and nipple with chlorhexidine and then expressing discharge I got a culture.  Impression and Recommendations:    The patient was counselled, risk factors were discussed, anticipatory guidance given.  Discharge from right nipple Cystic structure noted on breast ultrasound, discharge does appear purulent, cyst is tender. Breast MRI recently does not show any signs of malignancy, has had a previous duct excision. After cleansing over the top of the areola some of the purulence was expressed, cultures were taken. Adding doxycycline for 14 days. Considering some headaches and visual changes we are also going to add prolactin levels, CBC, CMP. Continue warm compresses, massage. This also may be hidradenitis suppurativa if cultures are negative, and if doxycycline does not work we can consider an intralesional steroid injection.    ___________________________________________ Ihor Austin. Benjamin Stain, M.D., ABFM., CAQSM. Primary Care and Sports Medicine [facility]  Adjunct Instructor of Family Medicine  [employer]

## 2017-07-18 NOTE — Assessment & Plan Note (Addendum)
Cystic structure noted on breast ultrasound, discharge does appear purulent, cyst is tender. Breast MRI recently does not show any signs of malignancy, has had a previous duct excision. After cleansing over the top of the areola some of the purulence was expressed, cultures were taken. Adding doxycycline for 14 days. Considering some headaches and visual changes we are also going to add prolactin levels, CBC, CMP. Continue warm compresses, massage. This also may be hidradenitis suppurativa if cultures are negative, and if doxycycline does not work we can consider an intralesional steroid injection.

## 2017-07-19 LAB — CBC WITH DIFFERENTIAL/PLATELET
Basophils Absolute: 18 {cells}/uL (ref 0–200)
Basophils Relative: 0.2 %
Eosinophils Absolute: 158 cells/uL (ref 15–500)
Eosinophils Relative: 1.8 %
HCT: 36.6 % (ref 35.0–45.0)
Hemoglobin: 12.1 g/dL (ref 11.7–15.5)
Lymphs Abs: 2860 {cells}/uL (ref 850–3900)
MCH: 30.8 pg (ref 27.0–33.0)
MCHC: 33.1 g/dL (ref 32.0–36.0)
MCV: 93.1 fL (ref 80.0–100.0)
MPV: 10.5 fL (ref 7.5–12.5)
Monocytes Relative: 4.4 %
Neutro Abs: 5377 cells/uL (ref 1500–7800)
Neutrophils Relative %: 61.1 %
Platelets: 256 Thousand/uL (ref 140–400)
RBC: 3.93 Million/uL (ref 3.80–5.10)
RDW: 11.7 % (ref 11.0–15.0)
Total Lymphocyte: 32.5 %
WBC mixed population: 387 {cells}/uL (ref 200–950)
WBC: 8.8 Thousand/uL (ref 3.8–10.8)

## 2017-07-19 LAB — COMPREHENSIVE METABOLIC PANEL
AG Ratio: 1.7 (calc) (ref 1.0–2.5)
CO2: 25 mmol/L (ref 20–32)
Chloride: 107 mmol/L (ref 98–110)
Globulin: 2.5 g/dL (calc) (ref 1.9–3.7)
Glucose, Bld: 92 mg/dL (ref 65–99)
Sodium: 139 mmol/L (ref 135–146)
Total Protein: 6.8 g/dL (ref 6.1–8.1)

## 2017-07-19 LAB — COMPREHENSIVE METABOLIC PANEL WITH GFR
ALT: 19 U/L (ref 6–29)
AST: 20 U/L (ref 10–30)
Albumin: 4.3 g/dL (ref 3.6–5.1)
Alkaline phosphatase (APISO): 52 U/L (ref 33–115)
BUN: 9 mg/dL (ref 7–25)
Calcium: 9.5 mg/dL (ref 8.6–10.2)
Creat: 0.69 mg/dL (ref 0.50–1.10)
Potassium: 3.9 mmol/L (ref 3.5–5.3)
Total Bilirubin: 0.3 mg/dL (ref 0.2–1.2)

## 2017-07-19 LAB — TIQ-MISC

## 2017-07-19 LAB — PROLACTIN: Prolactin: 10.4 ng/mL

## 2017-07-20 DIAGNOSIS — Z3044 Encounter for surveillance of vaginal ring hormonal contraceptive device: Secondary | ICD-10-CM | POA: Diagnosis not present

## 2017-07-20 DIAGNOSIS — Z01419 Encounter for gynecological examination (general) (routine) without abnormal findings: Secondary | ICD-10-CM | POA: Diagnosis not present

## 2017-07-20 DIAGNOSIS — N6452 Nipple discharge: Secondary | ICD-10-CM | POA: Diagnosis not present

## 2017-07-21 LAB — WOUND CULTURE
MICRO NUMBER:: 90642500
RESULT:: NO GROWTH
SPECIMEN QUALITY:: ADEQUATE

## 2017-07-27 ENCOUNTER — Ambulatory Visit (INDEPENDENT_AMBULATORY_CARE_PROVIDER_SITE_OTHER): Payer: 59 | Admitting: Sports Medicine

## 2017-07-27 DIAGNOSIS — N6452 Nipple discharge: Secondary | ICD-10-CM

## 2017-07-27 NOTE — Assessment & Plan Note (Signed)
Cystic structure possibly noted on breast ultrasound, discharge was not purulent with negative cultures at the last visit. Has not improved with doxycycline. Breast MRI showed no signs of malignancy but multiple scattered cysts, she has had a previous duct excision. Prolactin level, CBC, CMP were all negative. Suspicion now is hidradenitis suppurativa of the breast versus sebaceous cyst. Intralesional steroid injection today. They [facility] coming [facility].

## 2017-07-27 NOTE — Progress Notes (Signed)
Subjective:    CC: Recheck breast discharge  HPI: Cystic structure possibly noted on breast ultrasound, discharge was not purulent with negative cultures at the last visit. Has not improved with doxycycline. Breast MRI showed no signs of malignancy but multiple scattered cysts, she has had a previous duct excision. Prolactin level, CBC, CMP were all negative. Suspicion now is hidradenitis suppurativa of the breast versus sebaceous cyst. They do have an appointment with a breast specialist at [facility] coming up.  I reviewed the past medical history, family history, social history, surgical history, and allergies today and no changes were needed.  Please see the problem list section below in epic for further details.  Past Medical History: Past Medical History:  Diagnosis Date  . Discharge from right nipple 07/2016  . GERD (gastroesophageal reflux disease)   . Migraines   . Seasonal allergies    Past Surgical History: Past Surgical History:  Procedure Laterality Date  . BREAST DUCTAL SYSTEM EXCISION Right 08/04/2016   Procedure: RIGHT NIPPLE DUCT EXCISION;  Surgeon: Manus Rudd, MD;  Location: [facility];  Service: General;  Laterality: Right;  . WISDOM TOOTH EXTRACTION     Social History: Social History   Socioeconomic History  . Marital status: Married    Spouse name: Not on file  . Number of children: Not on file  . Years of education: Not on file  . Highest education level: Not on file  Occupational History  . Not on file  Social Needs  . Financial resource strain: Not on file  . Food insecurity:    Worry: Not on file    Inability: Not on file  . Transportation needs:    Medical: Not on file    Non-medical: Not on file  Tobacco Use  . Smoking status: Current Every Day Smoker    Years: 3.00    Types: Cigarettes  . Smokeless tobacco: Never Used  . Tobacco comment: 4 cig./day  Substance and Sexual Activity  . Alcohol use: Yes    Comment: weekends    . Drug use: No  . Sexual activity: Not on file  Lifestyle  . Physical activity:    Days per week: Not on file    Minutes per session: Not on file  . Stress: Not on file  Relationships  . Social connections:    Talks on phone: Not on file    Gets together: Not on file    Attends religious service: Not on file    Active member of club or organization: Not on file    Attends meetings of clubs or organizations: Not on file    Relationship status: Not on file  Other Topics Concern  . Not on file  Social History Narrative  . Not on file   Family History: Family History  Problem Relation Age of Onset  . Cancer Mother   . Hyperlipidemia Father    Allergies: Allergies  Allergen Reactions  . Adhesive [Tape] Other (See Comments)    STERI-STRIPS:  BLISTERS   Medications: See med rec.  Review of Systems: No fevers, chills, night sweats, weight loss, chest pain, or shortness of breath.   Objective:    General: Well Developed, well nourished, and in no acute distress.  Neuro: Alert and oriented x3, extra-ocular muscles intact, sensation grossly intact.  HEENT: Normocephalic, atraumatic, pupils equal round reactive to light, neck supple, no masses, no lymphadenopathy, thyroid nonpalpable.  Skin: Warm and dry, no rashes. Cardiac: Regular rate and rhythm, no  murmurs rubs or gallops, no lower extremity edema.  Respiratory: Clear to auscultation bilaterally. Not using accessory muscles, speaking in full sentences. Right breast: I was able to feel the cystic, tender structure at the 12 o'clock position over the nipple, still some whitish discharge present.  Procedure:  Injection of right breast cyst, 12:00 Consent obtained and verified. Time-out conducted. Noted no overlying erythema, induration, or other signs of local infection. Skin prepped in a sterile fashion. Topical analgesic spray: Ethyl chloride. Completed without difficulty. Meds: Used a total of 1/2 cc Kenalog 40, 1 cc  lidocaine and injected into and around the cystic structure. Pain immediately improved suggesting accurate placement of the medication. Advised to call if fevers/chills, erythema, induration, drainage, or persistent bleeding.  Impression and Recommendations:    Discharge from right nipple Cystic structure possibly noted on breast ultrasound, discharge was not purulent with negative cultures at the last visit. Has not improved with doxycycline. Breast MRI showed no signs of malignancy but multiple scattered cysts, she has had a previous duct excision. Prolactin level, CBC, CMP were all negative. Suspicion now is hidradenitis suppurativa of the breast versus sebaceous cyst. Intralesional steroid injection today. They do have an appointment with a breast specialist at [facility] coming up.  ___________________________________________ Ihor Austinhomas J. Benjamin Stainhekkekandam, M.D., ABFM., CAQSM. Primary Care and Sports Medicine [facility]  Adjunct Instructor of Family Medicine  [employer]

## 2017-08-21 MED FILL — ALPRAZolam 0.25 MG TABS: 0.25 | 10 days supply | Qty: 10 | Fill #0

## 2017-08-28 MED FILL — SUMATRIPTAN SUCC 100 MG TAB: 100 | 30 days supply | Qty: 9 | Fill #1

## 2017-08-28 MED FILL — NUVARING VAGINAL RING: 0.12-0.015 | 84 days supply | Qty: 3 | Fill #0

## 2017-10-04 MED FILL — SUMATRIPTAN SUCC 100 MG TAB: 100 | 30 days supply | Qty: 9 | Fill #2

## 2017-10-05 DIAGNOSIS — G43909 Migraine, unspecified, not intractable, without status migrainosus: Secondary | ICD-10-CM | POA: Diagnosis not present

## 2017-10-05 DIAGNOSIS — Z0000 Encounter for general adult medical examination without abnormal findings: Secondary | ICD-10-CM | POA: Diagnosis not present

## 2017-10-05 DIAGNOSIS — K219 Gastro-esophageal reflux disease without esophagitis: Secondary | ICD-10-CM | POA: Diagnosis not present

## 2017-10-05 DIAGNOSIS — E785 Hyperlipidemia, unspecified: Secondary | ICD-10-CM | POA: Diagnosis not present

## 2017-10-05 DIAGNOSIS — R000 Tachycardia, unspecified: Secondary | ICD-10-CM | POA: Diagnosis not present

## 2017-10-05 LAB — HEPATIC FUNCTION PANEL
ALT: 13 (ref 7–35)
AST: 15 (ref 13–35)
Alkaline Phosphatase: 42 (ref 25–125)
Bilirubin, Total: 0.4

## 2017-10-05 LAB — LIPID PANEL
Cholesterol: 305 — AB (ref 0–200)
HDL: 54 (ref 35–70)
LDL Cholesterol: 237
Triglycerides: 72 (ref 40–160)

## 2017-10-05 LAB — BASIC METABOLIC PANEL
BUN: 11 (ref 4–21)
Creatinine: 0.7 (ref 0.5–1.1)
Glucose: 86
Potassium: 3.5 (ref 3.4–5.3)
Sodium: 133 — AB (ref 137–147)

## 2017-10-06 DIAGNOSIS — R000 Tachycardia, unspecified: Secondary | ICD-10-CM | POA: Diagnosis not present

## 2017-10-18 MED FILL — ATORVASTATIN CALCIUM 20 MG: 20 | 90 days supply | Qty: 90 | Fill #0

## 2017-10-19 DIAGNOSIS — N6452 Nipple discharge: Secondary | ICD-10-CM | POA: Diagnosis not present

## 2017-10-19 DIAGNOSIS — Z1239 Encounter for other screening for malignant neoplasm of breast: Secondary | ICD-10-CM | POA: Diagnosis not present

## 2017-10-19 DIAGNOSIS — F17200 Nicotine dependence, unspecified, uncomplicated: Secondary | ICD-10-CM | POA: Diagnosis not present

## 2017-10-19 DIAGNOSIS — Z803 Family history of malignant neoplasm of breast: Secondary | ICD-10-CM | POA: Diagnosis not present

## 2017-10-19 DIAGNOSIS — R234 Changes in skin texture: Secondary | ICD-10-CM | POA: Diagnosis not present

## 2017-12-25 MED FILL — SUMATRIPTAN SUCC 100 MG TAB: 100 | 30 days supply | Qty: 9 | Fill #3

## 2017-12-26 DIAGNOSIS — Z803 Family history of malignant neoplasm of breast: Secondary | ICD-10-CM | POA: Diagnosis not present

## 2017-12-26 DIAGNOSIS — N6310 Unspecified lump in the right breast, unspecified quadrant: Secondary | ICD-10-CM | POA: Diagnosis not present

## 2018-01-09 DIAGNOSIS — E7800 Pure hypercholesterolemia, unspecified: Secondary | ICD-10-CM | POA: Diagnosis not present

## 2018-01-09 LAB — BASIC METABOLIC PANEL
BUN: 10 (ref 4–21)
Creatinine: 0.9 (ref 0.5–1.1)
Glucose: 86
Potassium: 4.1 (ref 3.4–5.3)
Sodium: 141 (ref 137–147)

## 2018-01-09 LAB — LIPID PANEL
Cholesterol: 204 — AB (ref 0–200)
HDL: 57 (ref 35–70)
LDL Cholesterol: 127
Triglycerides: 102 (ref 40–160)

## 2018-01-09 LAB — HEPATIC FUNCTION PANEL
ALT: 9 (ref 7–35)
AST: 14 (ref 13–35)
Alkaline Phosphatase: 43 (ref 25–125)
Bilirubin, Total: 0.3

## 2018-01-23 MED FILL — SUMATRIPTAN SUCC 100 MG TAB: 100 | 30 days supply | Qty: 4 | Fill #4

## 2018-01-23 MED FILL — ATORVASTATIN CALCIUM 20 MG: 20 | 90 days supply | Qty: 90 | Fill #1

## 2018-01-23 MED FILL — NUVARING VAGINAL RING: 0.12-0.015 | 84 days supply | Qty: 3 | Fill #1

## 2018-03-06 MED FILL — SUMAtriptan SUCCINATE 100 M: 100 | 30 days supply | Qty: 9 | Fill #0

## 2018-03-31 DIAGNOSIS — H5213 Myopia, bilateral: Secondary | ICD-10-CM | POA: Diagnosis not present

## 2018-04-02 MED FILL — PREDNISOLONE AC 1% EYE DROP: 1 | 13 days supply | Qty: 5 | Fill #0

## 2018-05-03 MED FILL — ATORVASTATIN 20 MG TABLET: 20 | 90 days supply | Qty: 90 | Fill #2

## 2018-05-03 MED FILL — ETONOGESTREL-ETHINYL ESTRAD: 0.12-0.015 | 84 days supply | Qty: 3 | Fill #2

## 2018-05-15 MED FILL — SUMATRIPTAN SUCC 100 MG TAB: 100 | 90 days supply | Qty: 27 | Fill #0

## 2018-07-19 ENCOUNTER — Encounter: Payer: Self-pay | Admitting: Neurology

## 2018-07-25 ENCOUNTER — Encounter: Payer: Self-pay | Admitting: Physician Assistant

## 2018-07-25 ENCOUNTER — Ambulatory Visit (INDEPENDENT_AMBULATORY_CARE_PROVIDER_SITE_OTHER): Payer: 59 | Admitting: Physician Assistant

## 2018-07-25 VITALS — BP 116/64 | HR 94 | Temp 98.7°F | Ht 61.75 in | Wt 115.0 lb

## 2018-07-25 DIAGNOSIS — E7801 Familial hypercholesterolemia: Secondary | ICD-10-CM | POA: Diagnosis not present

## 2018-07-25 DIAGNOSIS — Z131 Encounter for screening for diabetes mellitus: Secondary | ICD-10-CM

## 2018-07-25 DIAGNOSIS — Z720 Tobacco use: Secondary | ICD-10-CM

## 2018-07-25 DIAGNOSIS — G43119 Migraine with aura, intractable, without status migrainosus: Secondary | ICD-10-CM | POA: Diagnosis not present

## 2018-07-25 DIAGNOSIS — R4681 Obsessive-compulsive behavior: Secondary | ICD-10-CM | POA: Diagnosis not present

## 2018-07-25 DIAGNOSIS — R5383 Other fatigue: Secondary | ICD-10-CM | POA: Insufficient documentation

## 2018-07-25 DIAGNOSIS — F419 Anxiety disorder, unspecified: Secondary | ICD-10-CM | POA: Diagnosis not present

## 2018-07-25 MED ORDER — RIZATRIPTAN BENZOATE 10 MG PO TABS: 10.0000 mg | ORAL_TABLET | ORAL | 2 refills | Status: DC | PRN

## 2018-07-25 MED ORDER — SERTRALINE HCL 50 MG PO TABS: 50.0000 mg | ORAL_TABLET | Freq: Every day | ORAL | 1 refills | Status: DC

## 2018-07-25 MED FILL — ATORVASTATIN 20 MG TABLET: 20 | 90 days supply | Qty: 90 | Fill #3

## 2018-07-25 MED FILL — SERTRALINE HCL 50 MG TABS: 50 | 30 days supply | Qty: 30 | Fill #0

## 2018-07-25 MED FILL — RIZATRIPTAN BENZOATE 10 MG: 10 | 30 days supply | Qty: 12 | Fill #0

## 2018-07-25 NOTE — Progress Notes (Signed)
New Patient Office Visit  Subjective:  Patient ID: Vanessa Winters, female    DOB: [birth year]  Age: 23 y.o. MRN: [id number]  CC:  Chief Complaint  Patient presents with  . Establish Care    HPI [name] presents to establish care.   Pt has a hx of migraines. Recently over the past 3 months they have increase. She is having 12 plus migraines a month some with and without aura that occur on right side with light, sound sensitivity and nausea/vomiting. She take immitrex 100mg  and ibuprofen 800mg  with some relief to dull the pain but not resolve. She sleeps off the migraine. She did try TCA for prevention once but did not work.   She is concerned about her mood. She has been anxious "her whole life". She does feel like it is worsening. She feels like her obessions are worsening too. Her family has noticed. She could not find the match to her sock the other day and tore the whole house a part as well as dreamed about it until she found it. She didn't go grocery shopping because her parking spot was taken. She got livid mad at her husband for putting mayo on the wrong side her bread. 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 Her mind does not stop. Pt denies any SI/HC. She does feel tearful and sad. She has never been on medication. She has felt some compulsive behavior even at a young age with school work. She would redo all her homework with no mistakes. She would even re-write it multiple times. She is having trouble sleeping.   Past Medical History:  Diagnosis Date  . Colon polyps   . Discharge from right nipple 07/2016  . GERD (gastroesophageal reflux disease)   . Hyperlipidemia   . Migraines   . Seasonal allergies     Past Surgical History:  Procedure Laterality Date  . BREAST DUCTAL SYSTEM EXCISION Right 08/04/2016   Procedure: RIGHT NIPPLE DUCT EXCISION;  Surgeon: Manus Rudd, MD;  Location: [facility];  Service: General;  Laterality: Right;   . WISDOM TOOTH EXTRACTION      Family History  Problem Relation Age of Onset  . Hypertension Mother   . Breast cancer Mother   . Hyperlipidemia Father   . Breast cancer Maternal Grandmother   . Leukemia Paternal Grandmother   . Parkinson's disease Paternal Grandfather   . Skin cancer Other        uncle  . Heart attack Other        great uncle  . Stroke Other        great uncle    Social History   Socioeconomic History  . Marital status: Married    Spouse name: Not on file  . Number of children: Not on file  . Years of education: Not on file  . Highest education level: Not on file  Occupational History  . Not on file  Social Needs  . Financial resource strain: Not on file  . Food insecurity:    Worry: Not on file    Inability: Not on file  . Transportation needs:    Medical: Not on file    Non-medical: Not on file  Tobacco Use  . Smoking status: Current Every Day Smoker    Packs/day: 0.50    Years: 6.00    Pack years: 3.00    Types: Cigarettes  . Smokeless tobacco: Never Used  Substance and Sexual Activity  . Alcohol  use: Yes    Comment: sometimes  . Drug use: Never  . Sexual activity: Yes    Partners: Male    Birth control/protection: Inserts    Comment: nuvaring  Lifestyle  . Physical activity:    Days per week: Not on file    Minutes per session: Not on file  . Stress: Not on file  Relationships  . Social connections:    Talks on phone: Not on file    Gets together: Not on file    Attends religious service: Not on file    Active member of club or organization: Not on file    Attends meetings of clubs or organizations: Not on file    Relationship status: Not on file  . Intimate partner violence:    Fear of current or ex partner: No    Emotionally abused: No    Physically abused: No    Forced sexual activity: No  Other Topics Concern  . Not on file  Social History Narrative  . Not on file    ROS Review of Systems  All other systems  reviewed and are negative.   Objective:   Today's Vitals: BP 116/64   Pulse 94   Temp 98.7 F (37.1 C) (Oral)   Ht 5' 1.75" (1.568 m)   Wt 115 lb (52.2 kg)   SpO2 99%   BMI 21.20 kg/m   Physical Exam Vitals signs reviewed.  Constitutional:      Appearance: Normal appearance.  HENT:     Head: Normocephalic.  Cardiovascular:     Rate and Rhythm: Normal rate and regular rhythm.     Pulses: Normal pulses.  Pulmonary:     Effort: Pulmonary effort is normal.     Breath sounds: Normal breath sounds.  Lymphadenopathy:     Cervical: No cervical adenopathy.  Neurological:     General: No focal deficit present.     Mental Status: She is alert and oriented to person, place, and time.  Psychiatric:     Comments: tearful     Assessment & Plan:   Problem List Items Addressed This Visit      Unprioritized   Hyperlipidemia   Relevant Orders   Lipid Panel w/reflex Direct LDL   Tobacco abuse   Obsessive-compulsive behavior   Relevant Medications   sertraline (ZOLOFT) 50 MG tablet   Other Relevant Orders   TSH   CBC with Differential/Platelet   Ferritin   B12 and Folate Panel   VITAMIN D 25 Hydroxy (Vit-D Deficiency, Fractures)   Ambulatory referral to Psychology   Anxiety - Primary   Relevant Medications   sertraline (ZOLOFT) 50 MG tablet   Other Relevant Orders   TSH   CBC with Differential/Platelet   Ferritin   B12 and Folate Panel   VITAMIN D 25 Hydroxy (Vit-D Deficiency, Fractures)   Ambulatory referral to Psychology   No energy   Relevant Orders   TSH   CBC with Differential/Platelet   Ferritin   B12 and Folate Panel   VITAMIN D 25 Hydroxy (Vit-D Deficiency, Fractures)   Intractable migraine with aura without status migrainosus   Relevant Medications   rizatriptan (MAXALT) 10 MG tablet   sertraline (ZOLOFT) 50 MG tablet    Other Visit Diagnoses    Screening for diabetes mellitus       Relevant Orders   COMPLETE METABOLIC PANEL WITH GFR       Outpatient Encounter Medications as of 07/25/2018  Medication Sig  .  ALPRAZolam (XANAX) 0.25 MG tablet Take 0.25 mg by mouth as needed for anxiety (flying).  Marland Kitchen. atorvastatin (LIPITOR) 20 MG tablet Take 20 mg by mouth at bedtime.  . dicyclomine (BENTYL) 20 MG tablet Take 20 mg by mouth 3 (three) times daily as needed for spasms.  Marland Kitchen. etonogestrel-ethinyl estradiol (NUVARING) 0.12-0.015 MG/24HR vaginal ring Place 1 each vaginally every 28 (twenty-eight) days. Insert vaginally and leave in place for 3 consecutive weeks, then remove for 1 week.  . SUMAtriptan (IMITREX) 100 MG tablet TAKE ONE TABLET FOR HEADACHE, MAY REPEAT IN TWO HOURS IF HEADACHE PERSIST  . rizatriptan (MAXALT) 10 MG tablet Take 1 tablet (10 mg total) by mouth as needed for migraine. May repeat in 2 hours if needed  . sertraline (ZOLOFT) 50 MG tablet Take 1 tablet (50 mg total) by mouth daily.   No facility-administered encounter medications on file as of 07/25/2018.    Need labs. Will check TSH. She needs some CBT. Will place referral. Start zoloft. Discussed side effects. Follow up in 6 weeks.discussed good sleep hygiene. Discussed smoking cessation. Pt is not ready to quit. Discussed preventive options for migraines 12+ a month. She would like to try another rescue and hold on prevention. Pt smokes/migraine with aura and on OCP she is a high stroke risk. Pt aware. She does not want to change medications.   Follow-up: Return in about 6 weeks (around 09/05/2018).   Tandy GawJade Dallen Bunte, PA-C

## 2018-07-25 NOTE — Patient Instructions (Signed)
Living With Obsessive-Compulsive Disorder  If you have been diagnosed with obsessive-compulsive disorder (OCD), you may be relieved that you now know why you have felt or behaved a certain way. You may also feel overwhelmed about the treatment ahead, how to get the support you need, and how to deal with the condition day-to-day. With treatment and support, you can manage your OCD.  How to manage lifestyle changes  Managing stress  Stress is your body's reaction to life changes and events, both good and bad. Stress can play a major role in OCD, so it is important to learn how to cope with stress. Some techniques to cope with stress include:   Meditation, muscle relaxation, and breathing exercises.   Exercise. Even a short daily walk can help to lower stress levels.   Getting enough good-quality sleep.   Spending time on hobbies that you enjoy.   Accepting and letting go of things that you cannot change.  To deal with stress associated with OCD, your health care provider may recommend exposure and response prevention therapy. In this therapy, you will be exposed to the distressing situation that triggers your compulsion and be prevented from responding to it. With repetition of this process over time, you will no longer feel the distress or need to perform the compulsion.  Medicines  Your health care provider may suggest certain antidepressant medicines if he or she feels that they will help to improve your condition. Avoid using alcohol and other substances that may prevent your medicines from working properly (may interact). It is also important to:   Talk with your pharmacist or health care provider about all medicines that you take, their possible side effects, and which medicines are safe to take together.   Make it your goal to take part in all treatment decisions (shared decision-making). Ask about possible side effects of medicines that your health care provider recommends, and tell him or her how you  feel about having those side effects. It is best if shared decision-making with your health care provider is part of your total treatment plan.  If you are taking medicines as part of your treatment, do not stop taking medicines before you ask your health care provider if it is safe to stop. You may need to have the medicine slowly decreased (tapered) over time to lower the risk of harmful side effects.  Relationships  Your family and friends may need to learn about your OCD in order to cope with your condition and support you. Consider giving education materials to friends and family. Family therapy may also help to lower stress and relieve tension.  How to recognize changes in your condition  Some signs that your condition may be getting worse include:   Being anxious about germs or dirt.   Having harmful thoughts about yourself or others.   Making sure that household objects are alike or perfectly organized in a specific way.   Having great difficulty making decisions, or second-guessing yourself after making a decision.   Constant cleaning and handwashing.   Repeating behavior such as repeatedly checking to see if a door is locked or the oven is off.   Counting nonstop or uncontrollably.  Where to find support  Talking with others  It may be difficult to tell loved ones about your condition, but they can be a good support system for you. You can work with your therapist to decide whom to tell and when to tell them. Here are some tips for   starting the conversation:   Start by sharing your experience with OCD. It is up to you how much detail you want to provide.   Let your loved ones know that you are seeking treatment.   Do not expect loved ones to understand your condition right away.  Finances  Not all insurance plans cover mental health care, so it is important to check with your insurance carrier. If paying for co-pays or counseling services is a problem, search for a local or county mental health  care center. Public mental health care services may be offered there at a low cost or no cost when you are not able to see a private health care provider.  If you are taking medicine for depression, you may be able to get the generic form, which may be less expensive than brand-name medicine. Some makers of prescription medicines also offer help to patients who cannot afford the medicines they need.  Follow these instructions at home:     Check with your health care provider before starting any new prescription or over-the-counter medicines.   Ask for support from trusted family members or friends to make sure you stay on-track with your treatment.   Keep all follow-up visits as told by your health care provider and therapist. This is important.   Keep a journal to write down your daily moods, medicines, sleep habits, and life events. Doing this may help you have more success with your treatment.   Maintain a healthy lifestyle. Eat a healthy diet, exercise regularly, get plenty of sleep, and take time to relax.  Questions to ask your health care provider   If you are taking medicines:  ? How long do I need to take medicine?  ? Are there any long-term side effects of my medicine?  ? Are there any alternatives to taking medicine?   How would I benefit from therapy?   How often should I follow up with a health care provider?  Where to find more information   International OCD Foundation: www.iocdf.org   National Alliance on Mental Illness (NAMI): https://www.nami.org/Learn-More/Mental-Health-Conditions/obsessive-compulsive-disorder  Contact a health care provider if:   Your symptoms get worse or they do not get better with treatment.   You develop new symptoms.  Get help right away if:   You have severe side effects after taking your medicine.   You have thoughts about hurting yourself or others.  If you ever feel like you may hurt yourself or others, or have thoughts about taking your own life, get help  right away. You can go to your nearest emergency department or call:   Your local emergency services (911 in the [place]   A suicide crisis helpline, such as the National Suicide Prevention Lifeline at 1-800-273-8255. This is open 24 hours a day.  Summary   Stress can play a major role in obsessive-compulsive disorder (OCD). Learning ways to deal with stress may help your treatment work better for you.   If you are taking medicines as part of your treatment, do not stop taking medicines before you ask your health care provider if it is safe to stop.   When talking with family members and friends about your OCD, decide how much detail you want to give them and be patient as they work to understand your condition.   Keep all follow-up visits as told by your health care provider and therapist. This is important.  This information is not intended to replace advice given to you by

## 2018-07-26 ENCOUNTER — Other Ambulatory Visit: Payer: Self-pay | Admitting: Neurology

## 2018-07-26 LAB — CBC WITH DIFFERENTIAL/PLATELET
Absolute Monocytes: 254 {cells}/uL (ref 200–950)
Basophils Absolute: 0 {cells}/uL (ref 0–200)
Basophils Relative: 0 %
Eosinophils Absolute: 83 {cells}/uL (ref 15–500)
Eosinophils Relative: 1.4 %
HCT: 39.6 % (ref 35.0–45.0)
Hemoglobin: 13.1 g/dL (ref 11.7–15.5)
Lymphs Abs: 2679 {cells}/uL (ref 850–3900)
MCH: 31.8 pg (ref 27.0–33.0)
MCHC: 33.1 g/dL (ref 32.0–36.0)
MCV: 96.1 fL (ref 80.0–100.0)
MPV: 10.6 fL (ref 7.5–12.5)
Monocytes Relative: 4.3 %
Neutro Abs: 2885 {cells}/uL (ref 1500–7800)
Neutrophils Relative %: 48.9 %
Platelets: 245 10*3/uL (ref 140–400)
RBC: 4.12 Million/uL (ref 3.80–5.10)
RDW: 11.7 % (ref 11.0–15.0)
Total Lymphocyte: 45.4 %
WBC: 5.9 10*3/uL (ref 3.8–10.8)

## 2018-07-26 LAB — TSH: TSH: 1.64 mIU/L

## 2018-07-26 LAB — COMPLETE METABOLIC PANEL WITHOUT GFR
AG Ratio: 1.8 (calc) (ref 1.0–2.5)
ALT: 13 U/L (ref 6–29)
AST: 17 U/L (ref 10–30)
Albumin: 4.2 g/dL (ref 3.6–5.1)
Alkaline phosphatase (APISO): 48 U/L (ref 31–125)
BUN: 11 mg/dL (ref 7–25)
CO2: 27 mmol/L (ref 20–32)
Calcium: 9.3 mg/dL (ref 8.6–10.2)
Chloride: 109 mmol/L (ref 98–110)
Creat: 0.79 mg/dL (ref 0.50–1.10)
GFR, Est African American: 121 mL/min/{1.73_m2}
GFR, Est Non African American: 105 mL/min/{1.73_m2}
Globulin: 2.3 g/dL (ref 1.9–3.7)
Glucose, Bld: 91 mg/dL (ref 65–99)
Potassium: 4.2 mmol/L (ref 3.5–5.3)
Sodium: 140 mmol/L (ref 135–146)
Total Bilirubin: 0.4 mg/dL (ref 0.2–1.2)
Total Protein: 6.5 g/dL (ref 6.1–8.1)

## 2018-07-26 LAB — LIPID PANEL W/REFLEX DIRECT LDL
Cholesterol: 180 mg/dL
HDL: 53 mg/dL
LDL Cholesterol (Calc): 112 mg/dL — ABNORMAL HIGH
Non-HDL Cholesterol (Calc): 127 mg/dL
Total CHOL/HDL Ratio: 3.4 (calc)
Triglycerides: 67 mg/dL

## 2018-07-26 LAB — B12 AND FOLATE PANEL
Folate: 12.8 ng/mL
Vitamin B-12: 247 pg/mL (ref 200–1100)

## 2018-07-26 LAB — FERRITIN: Ferritin: 35 ng/mL (ref 16–154)

## 2018-07-26 LAB — VITAMIN D 25 HYDROXY (VIT D DEFICIENCY, FRACTURES): Vit D, 25-Hydroxy: 57 ng/mL (ref 30–100)

## 2018-07-26 MED ORDER — ETONOGESTREL-ETHINYL ESTRADIOL 0.12-0.015 MG/24HR VA RING: 1.0000 | VAGINAL_RING | VAGINAL | 3 refills | Status: DC

## 2018-07-26 MED FILL — ETONOGESTREL-ETHINYL ESTRAD: 0.12-0.015 | 84 days supply | Qty: 3 | Fill #0

## 2018-07-27 ENCOUNTER — Ambulatory Visit (INDEPENDENT_AMBULATORY_CARE_PROVIDER_SITE_OTHER): Payer: 59 | Admitting: Physician Assistant

## 2018-07-27 VITALS — BP 108/57 | HR 62 | Wt 115.0 lb

## 2018-07-27 DIAGNOSIS — E538 Deficiency of other specified B group vitamins: Secondary | ICD-10-CM | POA: Diagnosis not present

## 2018-07-27 MED ORDER — CYANOCOBALAMIN 1000 MCG/ML IJ SOLN
1000.0000 ug | Freq: Once | INTRAMUSCULAR | Status: AC
  Administered 2018-07-27: 1000 ug via INTRAMUSCULAR

## 2018-07-27 NOTE — Progress Notes (Signed)
Reviewed labs with patient. Her b12 is low. Lets get her b12 shot to get her started and then have her start OTC b12 sublingual.

## 2018-07-27 NOTE — Progress Notes (Signed)
Established Patient Office Visit  Subjective:  Patient ID: Vanessa Winters, female    DOB: Dec 17, 1995  Age: 23 y.o. MRN: [id number]  CC:  Chief Complaint  Patient presents with  . B12 Injection    HPI Vanessa Winters is here for a vitamin B 12 injection. Denies muscle cramps, weakness or irregular heart rate.   Past Medical History:  Diagnosis Date  . Colon polyps   . Discharge from right nipple 07/2016  . GERD (gastroesophageal reflux disease)   . Hyperlipidemia   . Migraines   . Seasonal allergies     Past Surgical History:  Procedure Laterality Date  . BREAST DUCTAL SYSTEM EXCISION Right 08/04/2016   Procedure: RIGHT NIPPLE DUCT EXCISION;  Surgeon: Manus Rudd, MD;  Location: [facility];  Service: General;  Laterality: Right;  . WISDOM TOOTH EXTRACTION      Family History  Problem Relation Age of Onset  . Hypertension Mother   . Breast cancer Mother   . Hyperlipidemia Father   . Breast cancer Maternal Grandmother   . Leukemia Paternal Grandmother   . Parkinson's disease Paternal Grandfather   . Skin cancer Other        uncle  . Heart attack Other        great uncle  . Stroke Other        great uncle    Social History   Socioeconomic History  . Marital status: Married    Spouse name: Not on file  . Number of children: Not on file  . Years of education: Not on file  . Highest education level: Not on file  Occupational History  . Not on file  Social Needs  . Financial resource strain: Not on file  . Food insecurity:    Worry: Not on file    Inability: Not on file  . Transportation needs:    Medical: Not on file    Non-medical: Not on file  Tobacco Use  . Smoking status: Current Every Day Smoker    Packs/day: 0.50    Years: 6.00    Pack years: 3.00    Types: Cigarettes  . Smokeless tobacco: Never Used  Substance and Sexual Activity  . Alcohol use: Yes    Comment: sometimes  . Drug use: Never  . Sexual activity: Yes   Partners: Male    Birth control/protection: Inserts    Comment: nuvaring  Lifestyle  . Physical activity:    Days per week: Not on file    Minutes per session: Not on file  . Stress: Not on file  Relationships  . Social connections:    Talks on phone: Not on file    Gets together: Not on file    Attends religious service: Not on file    Active member of club or organization: Not on file    Attends meetings of clubs or organizations: Not on file    Relationship status: Not on file  . Intimate partner violence:    Fear of current or ex partner: No    Emotionally abused: No    Physically abused: No    Forced sexual activity: No  Other Topics Concern  . Not on file  Social History Narrative  . Not on file    Outpatient Medications Prior to Visit  Medication Sig Dispense Refill  . ALPRAZolam (XANAX) 0.25 MG tablet Take 0.25 mg by mouth as needed for anxiety (flying).    Marland Kitchen atorvastatin (LIPITOR) 20 MG tablet Take  20 mg by mouth at bedtime.    . dicyclomine (BENTYL) 20 MG tablet Take 20 mg by mouth 3 (three) times daily as needed for spasms.    Marland Kitchen. etonogestrel-ethinyl estradiol (NUVARING) 0.12-0.015 MG/24HR vaginal ring Place 1 each vaginally every 28 (twenty-eight) days. Insert vaginally and leave in place for 3 consecutive weeks, then remove for 1 week. 3 each 3  . rizatriptan (MAXALT) 10 MG tablet Take 1 tablet (10 mg total) by mouth as needed for migraine. May repeat in 2 hours if needed 10 tablet 2  . sertraline (ZOLOFT) 50 MG tablet Take 1 tablet (50 mg total) by mouth daily. 30 tablet 1  . SUMAtriptan (IMITREX) 100 MG tablet TAKE ONE TABLET FOR HEADACHE, MAY REPEAT IN TWO HOURS IF HEADACHE PERSIST 10 tablet 3   No facility-administered medications prior to visit.     Allergies  Allergen Reactions  . Adhesive [Tape] Other (See Comments)    STERI-STRIPS:  BLISTERS    ROS Review of Systems    Objective:    Physical Exam  BP (!) 108/57   Pulse 62   Wt 115 lb (52.2  kg)   SpO2 100%   BMI 21.20 kg/m  Wt Readings from Last 3 Encounters:  07/27/18 115 lb (52.2 kg)  07/25/18 115 lb (52.2 kg)  07/27/17 139 lb (63 kg)     There are no preventive care reminders to display for this patient.  There are no preventive care reminders to display for this patient.  Lab Results  Component Value Date   TSH 1.64 07/25/2018   Lab Results  Component Value Date   WBC 5.9 07/25/2018   HGB 13.1 07/25/2018   HCT 39.6 07/25/2018   MCV 96.1 07/25/2018   PLT 245 07/25/2018   Lab Results  Component Value Date   NA 140 07/25/2018   K 4.2 07/25/2018   CO2 27 07/25/2018   GLUCOSE 91 07/25/2018   BUN 11 07/25/2018   CREATININE 0.79 07/25/2018   BILITOT 0.4 07/25/2018   ALKPHOS 43 01/09/2018   AST 17 07/25/2018   ALT 13 07/25/2018   PROT 6.5 07/25/2018   ALBUMIN 4.3 12/05/2014   CALCIUM 9.3 07/25/2018   Lab Results  Component Value Date   CHOL 180 07/25/2018   Lab Results  Component Value Date   HDL 53 07/25/2018   Lab Results  Component Value Date   LDLCALC 112 (H) 07/25/2018   Lab Results  Component Value Date   TRIG 67 07/25/2018   Lab Results  Component Value Date   CHOLHDL 3.4 07/25/2018   No results found for: HGBA1C    Assessment & Plan:  Low vitamin B12 - Patient tolerated injection well without complications.    Problem List Items Addressed This Visit    None    Visit Diagnoses    Vitamin B12 nutritional deficiency    -  Primary   Relevant Medications   cyanocobalamin ((VITAMIN B-12)) injection 1,000 mcg (Completed)      Meds ordered this encounter  Medications  . cyanocobalamin ((VITAMIN B-12)) injection 1,000 mcg    Follow-up: No follow-ups on file.    Esmond Harpsuttle, Dreshaun Stene Hale, CMA

## 2018-08-10 ENCOUNTER — Encounter: Payer: Self-pay | Admitting: Physician Assistant

## 2018-08-23 MED FILL — SERTRALINE HCL 50 MG TABS: 50 | 30 days supply | Qty: 30 | Fill #1

## 2018-08-30 ENCOUNTER — Encounter: Payer: Self-pay | Admitting: Physician Assistant

## 2018-09-05 ENCOUNTER — Encounter: Payer: Self-pay | Admitting: Physician Assistant

## 2018-09-05 ENCOUNTER — Other Ambulatory Visit: Payer: Self-pay

## 2018-09-05 ENCOUNTER — Ambulatory Visit (INDEPENDENT_AMBULATORY_CARE_PROVIDER_SITE_OTHER): Payer: 59

## 2018-09-05 ENCOUNTER — Ambulatory Visit (INDEPENDENT_AMBULATORY_CARE_PROVIDER_SITE_OTHER): Payer: 59 | Admitting: Physician Assistant

## 2018-09-05 VITALS — BP 108/65 | HR 98 | Temp 98.2°F | Ht 61.75 in | Wt 116.0 lb

## 2018-09-05 DIAGNOSIS — M549 Dorsalgia, unspecified: Secondary | ICD-10-CM | POA: Diagnosis not present

## 2018-09-05 DIAGNOSIS — G43119 Migraine with aura, intractable, without status migrainosus: Secondary | ICD-10-CM | POA: Diagnosis not present

## 2018-09-05 DIAGNOSIS — F419 Anxiety disorder, unspecified: Secondary | ICD-10-CM

## 2018-09-05 DIAGNOSIS — R4681 Obsessive-compulsive behavior: Secondary | ICD-10-CM

## 2018-09-05 DIAGNOSIS — R0781 Pleurodynia: Secondary | ICD-10-CM | POA: Diagnosis not present

## 2018-09-05 IMAGING — DX RIGHT RIBS - 2 VIEW
3 series · 3 of 3 positions shown · non-contrast
Comparison: None.

CLINICAL DATA: Right rib pain and tenderness with increased
shortness of breath for the past week after feeling a popping
sensation when twisting.

EXAM:
RIGHT RIBS - 2 VIEW

[rib ap]
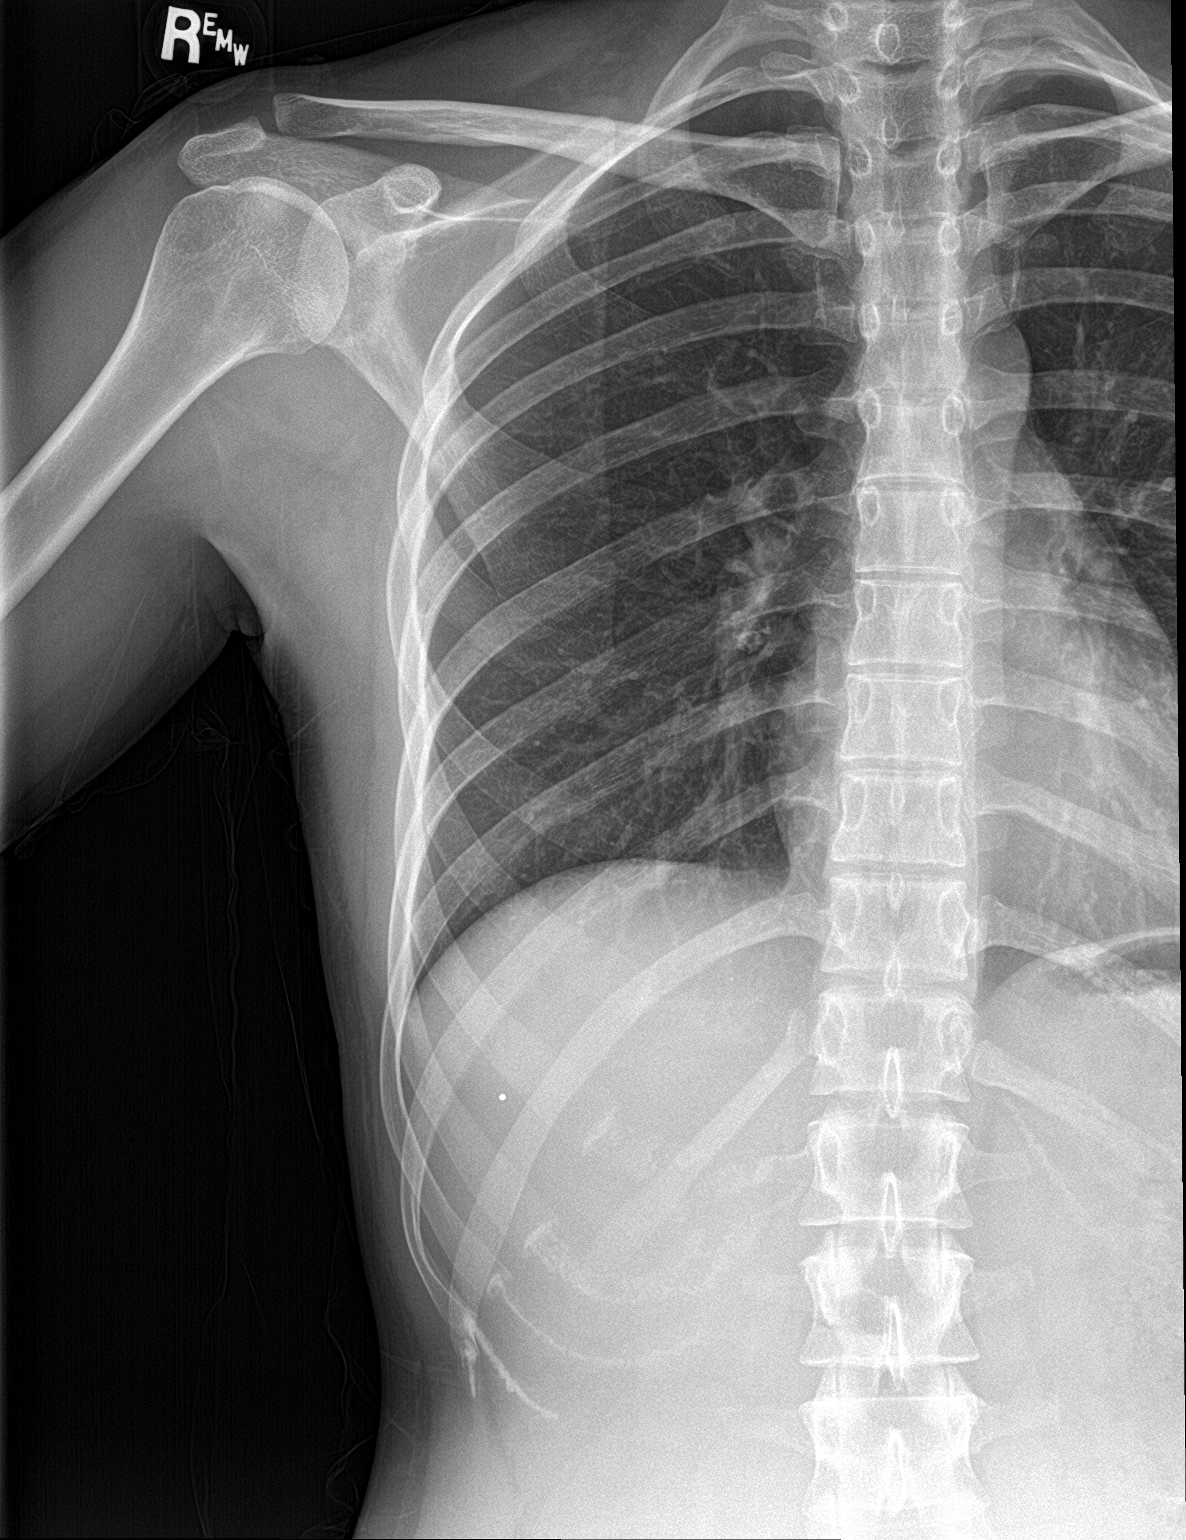

[rib ap obl (1 of 2)]
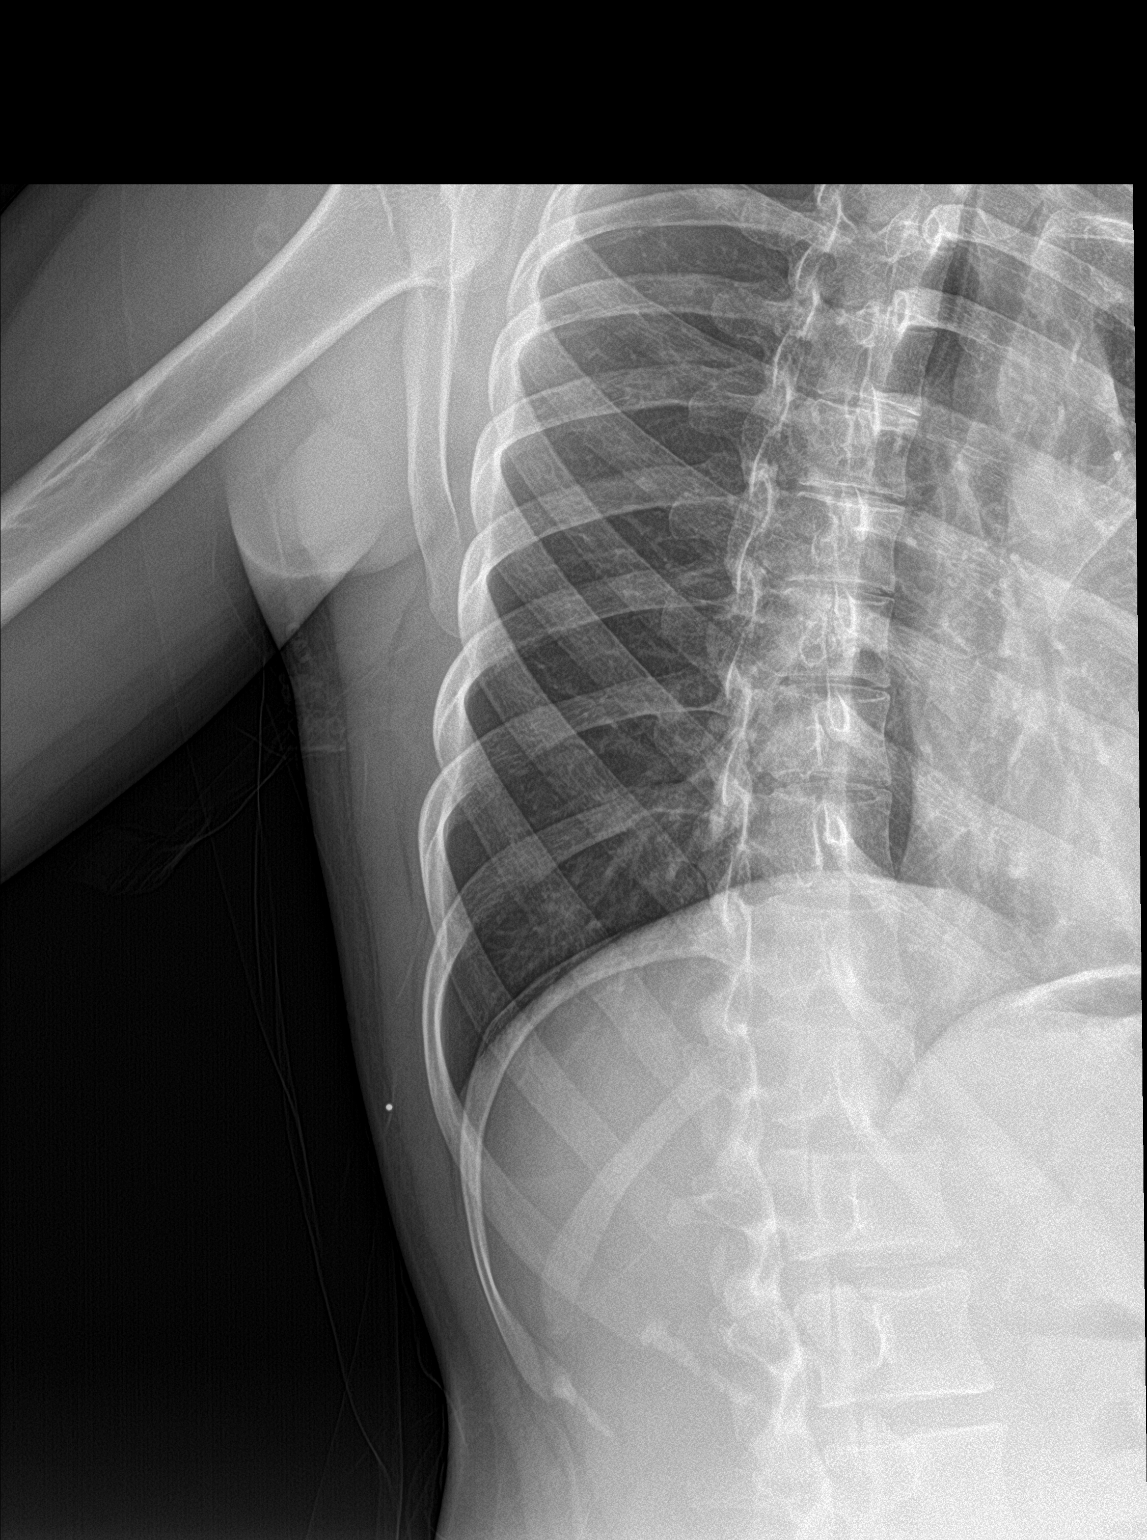

[rib ap obl (2 of 2)]
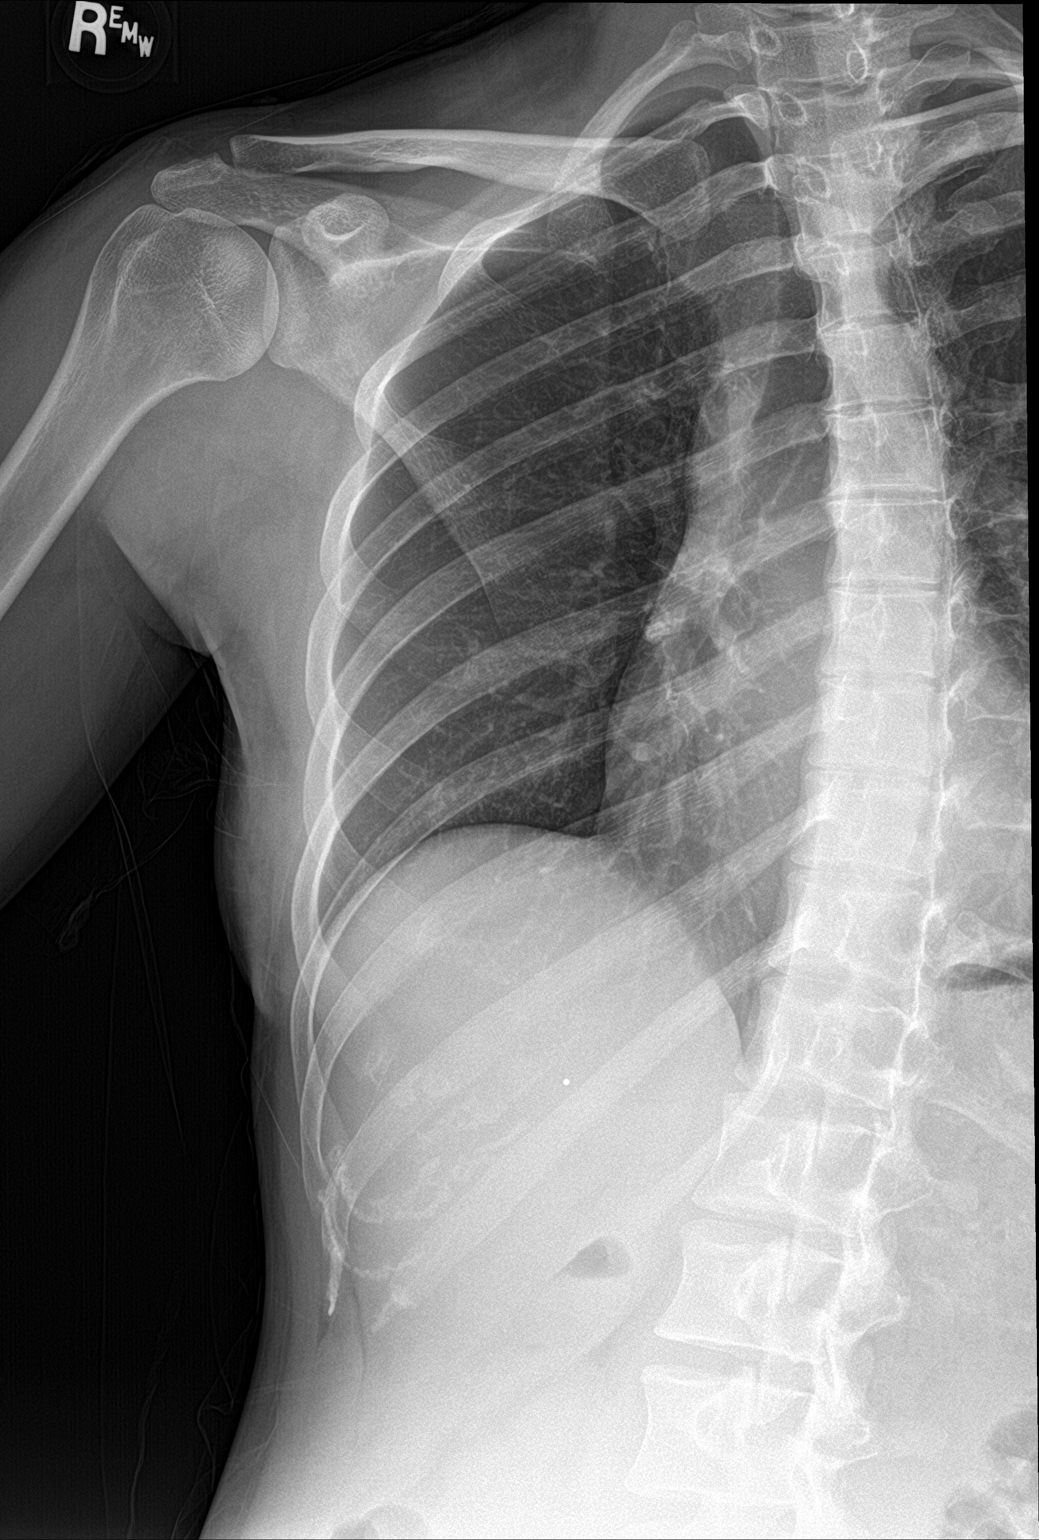

[3 of 3 positions shown; findings below may reference images not displayed]

FINDINGS: Normal sized heart. The included lungs are clear. Normal appearing
bones with no rib fracture or pneumothorax seen.
IMPRESSION: Normal examination.

## 2018-09-05 MED ORDER — SERTRALINE HCL 100 MG PO TABS: ORAL_TABLET | ORAL | 1 refills | Status: DC

## 2018-09-05 MED ORDER — MELOXICAM 15 MG PO TABS: 15.0000 mg | ORAL_TABLET | Freq: Every day | ORAL | 0 refills | Status: DC

## 2018-09-05 MED ORDER — TRAMADOL HCL 50 MG PO TABS: 50.0000 mg | ORAL_TABLET | Freq: Three times a day (TID) | ORAL | 0 refills | Status: AC | PRN

## 2018-09-05 MED FILL — SERTRALINE HCL 100 MG TAB: 100 | 90 days supply | Qty: 135 | Fill #0

## 2018-09-05 MED FILL — traMADol HCL 50 MG TABS: 50 | 5 days supply | Qty: 15 | Fill #0

## 2018-09-05 MED FILL — MELOXICAM 15 MG TABLET: 15 | 30 days supply | Qty: 30 | Fill #0

## 2018-09-05 NOTE — Progress Notes (Signed)
Subjective:    Patient ID: Vanessa Winters, female    DOB: [birth year], 23 y.o.   MRN: [id number]  HPI  Pt is a 23 yo female with obsessive/compulsive behavior, migraines who presents to the clinic for follow up.   She does feel like zoloft is helping. She feels like she can do more things out of her previous comfort zone. She sees more things that used to really bother her but then bother her less. She does still struggle with thoughts and can't get stuff off her mind.   1 week ago she was playing disc golf and fell while throwing disc. Right mid back has hurt since. She has a lot of pain when trying to throw a frisbee but also pain with deep breaths and moving right arm. Tried tylenol and ibuprofen with little relief.   .. Active Ambulatory Problems    Diagnosis Date Noted  . Hyperlipidemia 05/22/2011  . History of migraine headaches 02/18/2013  . Irregular menses 02/18/2013  . Hidradenitis axillaris 11/07/2014  . Discharge from right nipple 05/19/2016  . Tobacco abuse 07/25/2018  . Obsessive-compulsive behavior 07/25/2018  . Anxiety 07/25/2018  . No energy 07/25/2018  . Intractable migraine with aura without status migrainosus 07/25/2018   Resolved Ambulatory Problems    Diagnosis Date Noted  . No Resolved Ambulatory Problems   Past Medical History:  Diagnosis Date  . Colon polyps   . GERD (gastroesophageal reflux disease)   . Migraines   . Seasonal allergies        Review of Systems See HPI.     Objective:   Physical Exam Vitals signs reviewed.  Constitutional:      Appearance: Normal appearance.  Cardiovascular:     Rate and Rhythm: Normal rate and regular rhythm.  Pulmonary:     Effort: Pulmonary effort is normal.  Musculoskeletal:     Comments: Tenderness over right mid back along the rib  Neurological:     General: No focal deficit present.     Mental Status: She is alert and oriented to person, place, and time.  Psychiatric:        Mood and Affect:  Mood normal.     .. Depression screen [facility] 2/9 09/05/2018 07/19/2018  Decreased Interest 1 1  Down, Depressed, Hopeless 1 1  PHQ - 2 Score 2 2  Altered sleeping 1 0  Tired, decreased energy 2 3  Change in appetite 2 1  Feeling bad or failure about yourself  2 2  Trouble concentrating 2 2  Moving slowly or fidgety/restless 1 0  Suicidal thoughts 0 0  PHQ-9 Score 12 10  Difficult doing work/chores Somewhat difficult Somewhat difficult   .Marland Kitchen GAD 7 : Generalized Anxiety Score 09/05/2018 07/19/2018  Nervous, Anxious, on Edge 2 2  Control/stop worrying 2 3  Worry too much - different things 2 3  Trouble relaxing 2 0  Restless 1 0  Easily annoyed or irritable 2 2  Afraid - awful might happen 2 3  Total GAD 7 Score 13 13  Anxiety Difficulty Somewhat difficult Somewhat difficult          Assessment & Plan:  Marland KitchenMarland KitchenTasheika was seen today for follow-up.  Diagnoses and all orders for this visit:  Obsessive-compulsive behavior -     sertraline (ZOLOFT) 100 MG tablet; Take one and one half tablet daily.  Anxiety -     sertraline (ZOLOFT) 100 MG tablet; Take one and one half tablet daily.  Mid back pain on  right side -     meloxicam (MOBIC) 15 MG tablet; Take 1 tablet (15 mg total) by mouth daily. -     DG Ribs Unilateral Right -     traMADol (ULTRAM) 50 MG tablet; Take 1 tablet (50 mg total) by mouth every 8 (eight) hours as needed for up to 5 days.  Intractable migraine with aura without status migrainosus  PHq and GAD have not changed.   Increased zoloft to 1.5 tablet daily. I do think it is helping. Before adding another medication for anxiety I would like to try to maximize this one. Pt has not called counselor back. Encouraged this as I think it would be good to work through these things.   I read xray before radiologist. I felt like there was some small changes in 7th/8th rib that could be hairline fracture. It is very painful and worse to touch. Given mobic and tramadol as  needed. Discussed fractures can take weeks to heal. Use icy hot patches. Follow up as needed.

## 2018-09-07 ENCOUNTER — Encounter: Payer: Self-pay | Admitting: Physician Assistant

## 2018-10-31 MED FILL — ETONOGESTREL-ETHINYL ESTRAD: 0.12-0.015 | 84 days supply | Qty: 3 | Fill #1

## 2018-10-31 MED FILL — RIZATRIPTAN BENZOATE 10 MG: 10 | 30 days supply | Qty: 12 | Fill #1

## 2018-11-29 MED FILL — SERTRALINE HCL 100 MG TABS: 100 | 90 days supply | Qty: 135 | Fill #1

## 2018-12-04 ENCOUNTER — Other Ambulatory Visit: Payer: Self-pay | Admitting: Neurology

## 2018-12-04 MED ORDER — ATORVASTATIN CALCIUM 20 MG PO TABS: 20.0000 mg | ORAL_TABLET | Freq: Every day | ORAL | 1 refills | Status: DC

## 2018-12-04 MED FILL — ATORVASTATIN 20 MG TABLET: 20 | 90 days supply | Qty: 90 | Fill #0

## 2018-12-04 MED FILL — RIZATRIPTAN BENZOATE 10 MG: 10 | 15 days supply | Qty: 6 | Fill #2

## 2019-01-10 ENCOUNTER — Other Ambulatory Visit: Payer: Self-pay

## 2019-01-10 ENCOUNTER — Ambulatory Visit (INDEPENDENT_AMBULATORY_CARE_PROVIDER_SITE_OTHER): Payer: 59

## 2019-01-10 DIAGNOSIS — M25441 Effusion, right hand: Secondary | ICD-10-CM | POA: Diagnosis not present

## 2019-01-10 DIAGNOSIS — M79641 Pain in right hand: Secondary | ICD-10-CM | POA: Diagnosis not present

## 2019-01-10 DIAGNOSIS — M79644 Pain in right finger(s): Secondary | ICD-10-CM | POA: Diagnosis not present

## 2019-01-10 IMAGING — DX DG HAND COMPLETE 3+V*R*
3 series · 3 of 3 positions shown · non-contrast
Comparison: None.

CLINICAL DATA: 23-year-old female with right little finger pain
since last night. Complaining of pain in fifth MCP and DIP joints.

EXAM:
RIGHT HAND - COMPLETE 3+ VIEW

[hand pa]
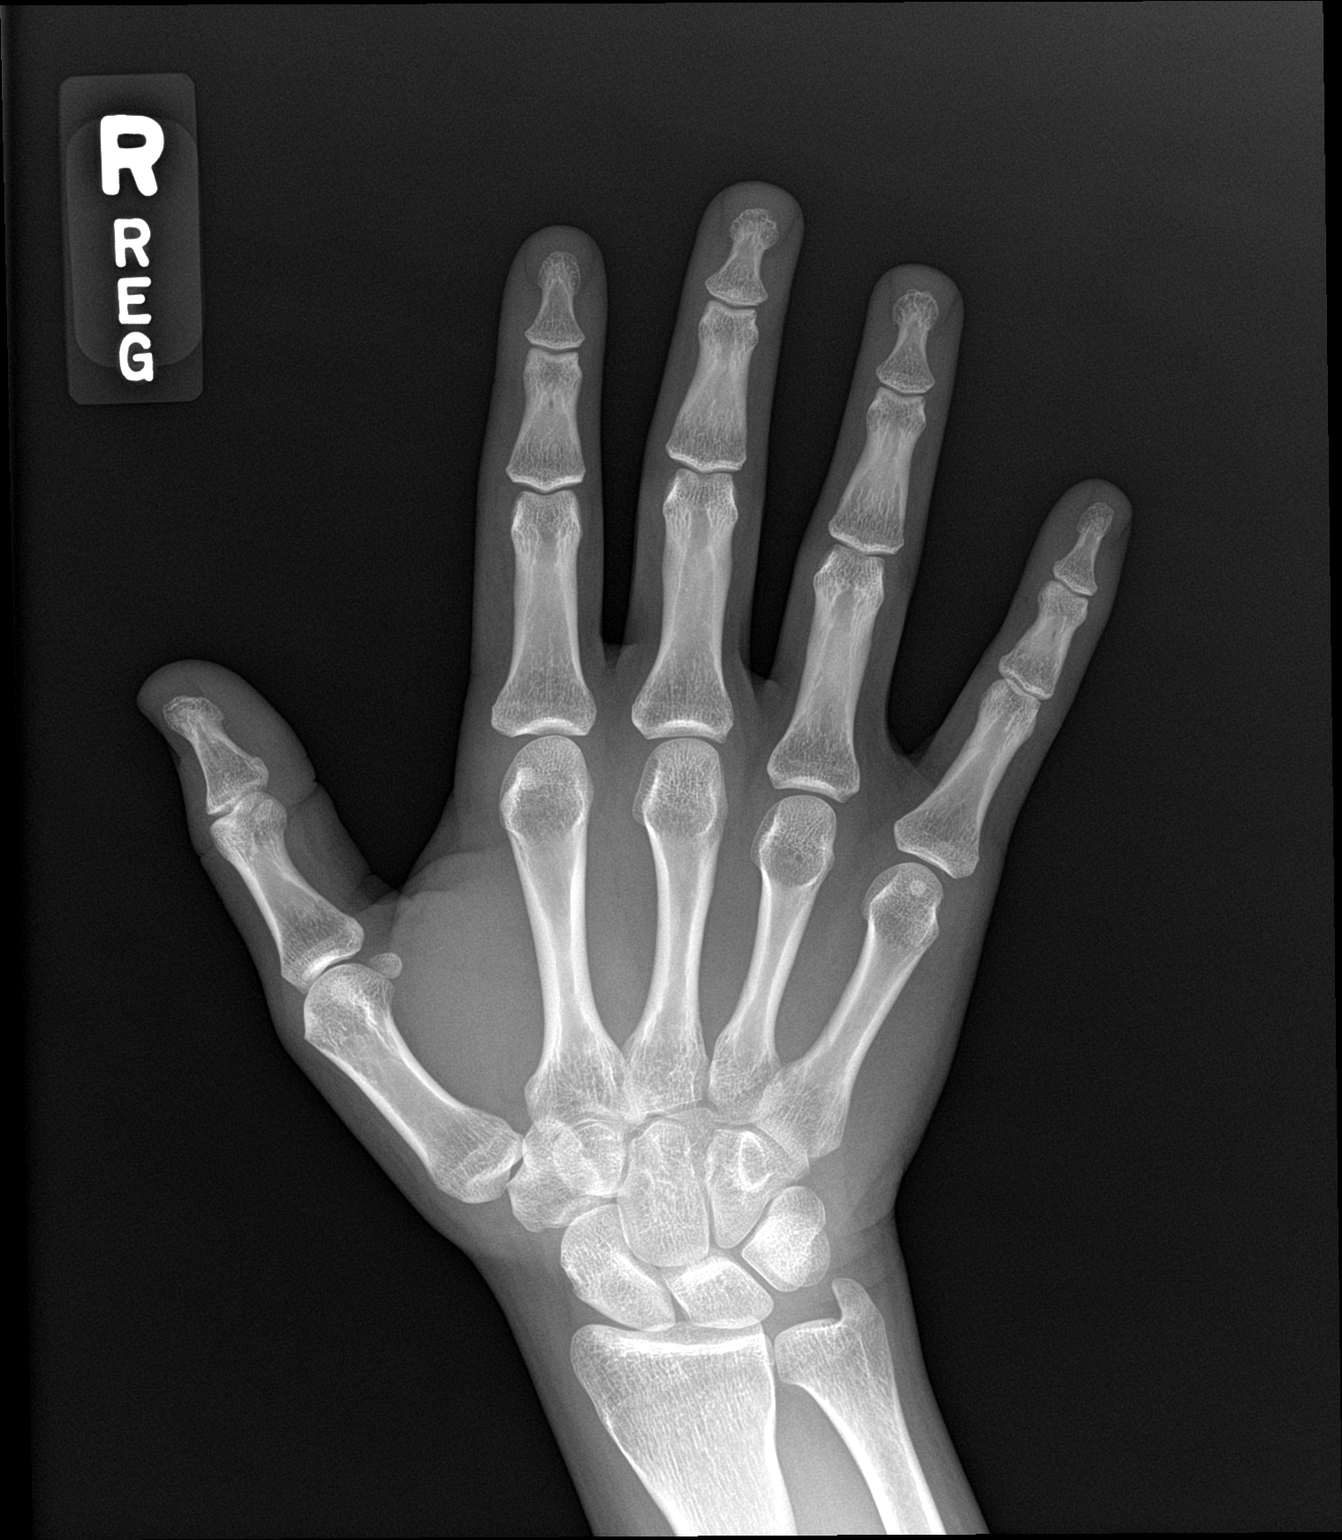

[hand obl]
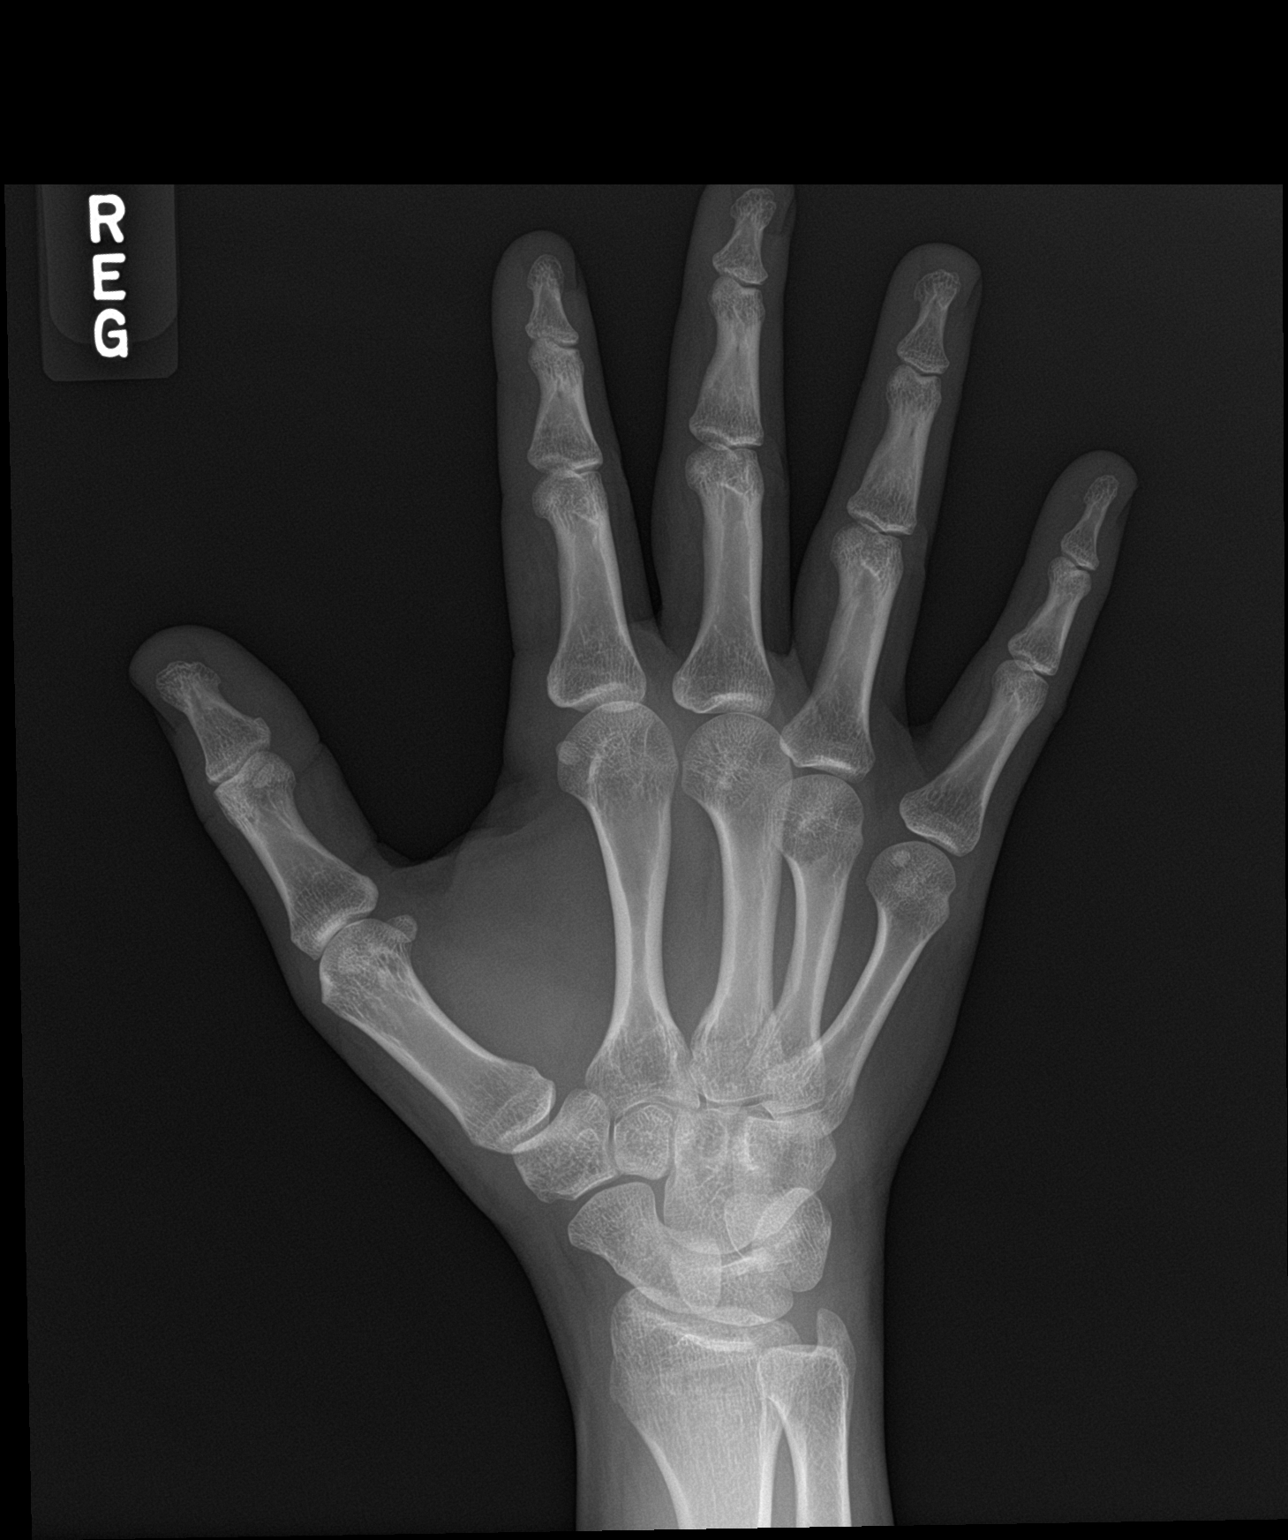

[hand lat]
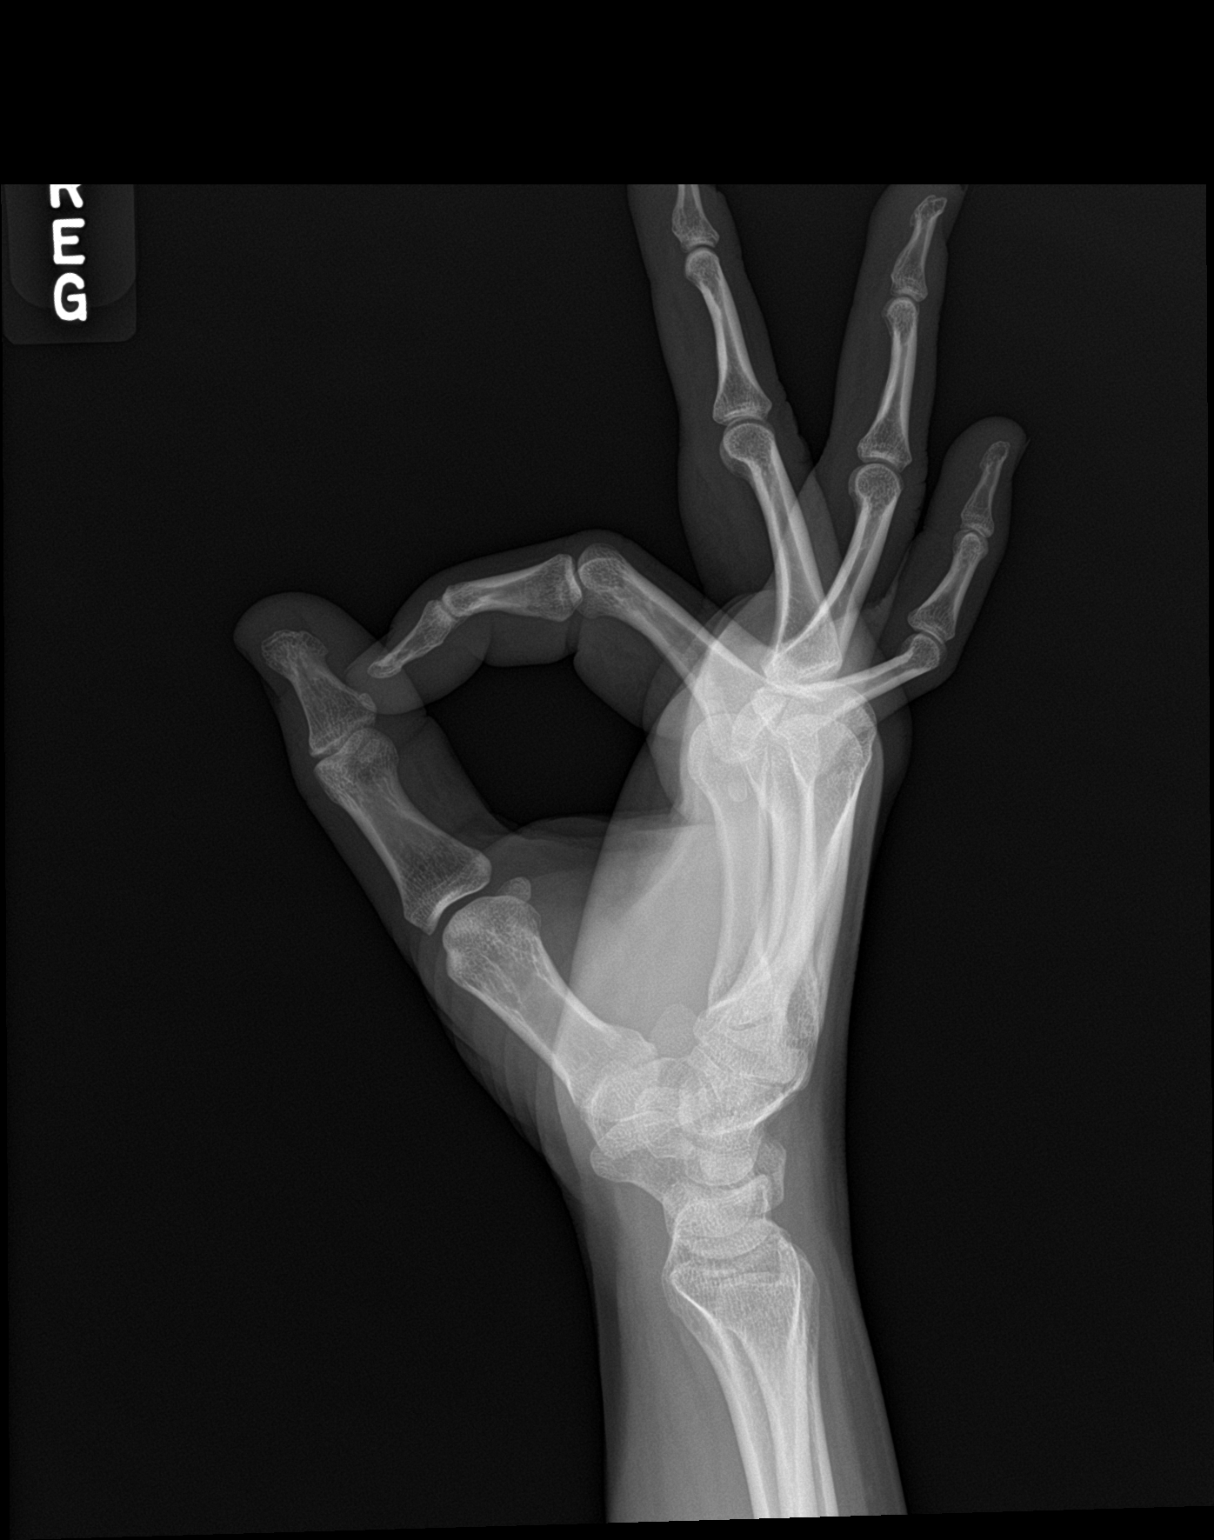

[3 of 3 positions shown; findings below may reference images not displayed]

FINDINGS: There is no evidence of fracture or dislocation. There is no
evidence of arthropathy or other focal bone abnormality. Soft
tissues are unremarkable.
IMPRESSION: Normal examination of the right hand.

## 2019-01-11 ENCOUNTER — Other Ambulatory Visit: Payer: Self-pay

## 2019-01-11 ENCOUNTER — Ambulatory Visit: Payer: 59

## 2019-01-11 DIAGNOSIS — Z209 Contact with and (suspected) exposure to unspecified communicable disease: Secondary | ICD-10-CM

## 2019-01-13 LAB — NOVEL CORONAVIRUS, NAA: SARS-CoV-2, NAA: NOT DETECTED

## 2019-01-14 NOTE — Progress Notes (Signed)
Not detected.

## 2019-01-25 ENCOUNTER — Other Ambulatory Visit: Payer: Self-pay | Admitting: Physician Assistant

## 2019-01-25 DIAGNOSIS — G43119 Migraine with aura, intractable, without status migrainosus: Secondary | ICD-10-CM

## 2019-01-25 MED FILL — ETONOGESTREL-ETHINYL ESTRAD: 0.12-0.015 | 84 days supply | Qty: 3 | Fill #2

## 2019-01-25 MED FILL — RIZATRIPTAN BENZOATE 10 MG: 10 | 17 days supply | Qty: 10 | Fill #0

## 2019-02-20 ENCOUNTER — Other Ambulatory Visit: Payer: Self-pay

## 2019-02-20 DIAGNOSIS — Z20822 Contact with and (suspected) exposure to covid-19: Secondary | ICD-10-CM

## 2019-02-20 DIAGNOSIS — Z20828 Contact with and (suspected) exposure to other viral communicable diseases: Secondary | ICD-10-CM | POA: Diagnosis not present

## 2019-02-21 LAB — NOVEL CORONAVIRUS, NAA: SARS-CoV-2, NAA: NOT DETECTED

## 2019-02-21 LAB — SPECIMEN STATUS REPORT

## 2019-03-01 ENCOUNTER — Ambulatory Visit (INDEPENDENT_AMBULATORY_CARE_PROVIDER_SITE_OTHER): Payer: No Typology Code available for payment source | Admitting: Physician Assistant

## 2019-03-01 VITALS — BP 106/62 | HR 79

## 2019-03-01 DIAGNOSIS — F419 Anxiety disorder, unspecified: Secondary | ICD-10-CM | POA: Diagnosis not present

## 2019-03-01 DIAGNOSIS — M2550 Pain in unspecified joint: Secondary | ICD-10-CM

## 2019-03-01 DIAGNOSIS — R232 Flushing: Secondary | ICD-10-CM | POA: Diagnosis not present

## 2019-03-01 DIAGNOSIS — G479 Sleep disorder, unspecified: Secondary | ICD-10-CM

## 2019-03-01 DIAGNOSIS — R4681 Obsessive-compulsive behavior: Secondary | ICD-10-CM

## 2019-03-01 DIAGNOSIS — Z716 Tobacco abuse counseling: Secondary | ICD-10-CM | POA: Diagnosis not present

## 2019-03-01 DIAGNOSIS — M152 Bouchard's nodes (with arthropathy): Secondary | ICD-10-CM

## 2019-03-01 MED ORDER — SERTRALINE HCL 100 MG PO TABS: 200.0000 mg | ORAL_TABLET | Freq: Every day | ORAL | 3 refills | Status: DC

## 2019-03-01 MED FILL — SERTRALINE HCL 100 MG TABS: 100 | 90 days supply | Qty: 180 | Fill #0

## 2019-03-01 NOTE — Progress Notes (Signed)
Subjective:    Patient ID: Vanessa Winters, female    DOB: [birth year], 24 y.o.   MRN: [id number]  HPI  Pt is a 24 yo female with OCD behaviors and anxiety who presents to the clinic for follow up.   She is doing really well on zoloft. She feels like it has helped her anxiety a lot. She feels like her OCD tendencies are better and she can work through them better. She did self increase to 266m and feels like that is the better dose. She does endorse some night sweats and hot flashes in general that started after starting zoloft.   Migraines- controlled and stable. Rarely uses maxalt.   She would like to quit smoking over past week she stopped except one cigarette a day.  Since then she has not been able to sleep well and anxiety been a little worse. She is exercising and eating well.   She is a little concerned with achy joints and recent 1 month hx of left pip joint pain. Her mother has some relatives with RA. Symptoms worse in am, when it is cold, when weather is bad.   .. Active Ambulatory Problems    Diagnosis Date Noted  . Hyperlipidemia 05/22/2011  . History of migraine headaches 02/18/2013  . Irregular menses 02/18/2013  . Hidradenitis axillaris 11/07/2014  . Discharge from right nipple 05/19/2016  . Tobacco abuse 07/25/2018  . Obsessive-compulsive behavior 07/25/2018  . Anxiety 07/25/2018  . No energy 07/25/2018  . Intractable migraine with aura without status migrainosus 07/25/2018  . Trouble in sleeping 03/04/2019  . 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 Arthralgia 03/04/2019  . Hot flashes 03/04/2019   Resolved Ambulatory Problems    Diagnosis Date Noted  . No Resolved Ambulatory Problems   Past Medical History:  Diagnosis Date  . Colon polyps   . GERD (gastroesophageal reflux disease)   . Migraines   . Seasonal allergies      Review of Systems See HPI.     Objective:   Physical Exam Vitals reviewed.   Constitutional:      Appearance: Normal appearance.  Cardiovascular:     Rate and Rhythm: Normal rate and regular rhythm.  Pulmonary:     Effort: Pulmonary effort is normal.  Musculoskeletal:     Comments: Swelling of right pinky PIP joint. Tenderness to extend.   No other joint swelling. Hand grip 5/5.   Neurological:     General: No focal deficit present.     Mental Status: She is alert and oriented to person, place, and time.  Psychiatric:        Mood and Affect: Mood normal.       .. [facility]/2019 09/05/2018 07/19/2018  Decreased Interest '1 1 1  ' Down, Depressed, Hopeless '1 1 1  ' PHQ - 2 Score '2 2 2  ' Altered sleeping 3 1 0  Tired, decreased energy '1 2 3  ' Change in appetite '1 2 1  ' Feeling bad or failure about yourself  0 2 2  Trouble concentrating 0 2 2  Moving slowly or fidgety/restless 0 1 0  Suicidal thoughts 0 0 0  PHQ-9 Score '7 12 10  ' Difficult doing work/chores Somewhat difficult Somewhat difficult Somewhat difficult   ..Marland KitchenGAD 7 : Generalized Anxiety Score 03/01/2019 09/05/2018 07/19/2018  Nervous, Anxious, on Edge '1 2 2  ' Control/stop worrying 0 2 3  Worry too much - different things 0 2 3  Trouble relaxing 0 2 0  Restless 0 1 0  Easily annoyed or irritable 0 2 2  Afraid - awful might happen 0 2 3  Total GAD 7 Score '1 13 13  ' Anxiety Difficulty Somewhat difficult Somewhat difficult Somewhat difficult        Assessment & Plan:  Marland KitchenMarland KitchenRyane was seen today for follow-up.  Diagnoses and all orders for this visit:  Obsessive-compulsive behavior -     sertraline (ZOLOFT) 100 MG tablet; Take 2 tablets (200 mg total) by mouth daily.  Anxiety -     sertraline (ZOLOFT) 100 MG tablet; Take 2 tablets (200 mg total) by mouth daily.  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  Continue on zoloft. Ok with increase to 242m daily.  zoloft could be causing some of her sweating issues. Pt does not want to decrease or try another medication at this time.   Discussed smoking cessation. Offered medication. Pt declined. HO given. Discussed symptoms that can occur due to smoking cessation.  Likely sleep disturbance is nicotine related. Discussed unisom. If need trazodone can send as well, hopefully temporary.   Will get some labs on sudden swelling and pain in one joint and overall hand joint pain.   Follow up in 3 months.

## 2019-03-03 NOTE — Progress Notes (Signed)
Trenna,   RA factor negative. Sed rate low. B12 good. A few other labs pending.

## 2019-03-04 ENCOUNTER — Encounter: Payer: Self-pay | Admitting: Physician Assistant

## 2019-03-04 DIAGNOSIS — M152 Bouchard's nodes (with arthropathy): Secondary | ICD-10-CM | POA: Insufficient documentation

## 2019-03-04 DIAGNOSIS — R232 Flushing: Secondary | ICD-10-CM | POA: Insufficient documentation

## 2019-03-04 DIAGNOSIS — M2550 Pain in unspecified joint: Secondary | ICD-10-CM | POA: Insufficient documentation

## 2019-03-04 DIAGNOSIS — G479 Sleep disorder, unspecified: Secondary | ICD-10-CM | POA: Insufficient documentation

## 2019-03-04 LAB — RHEUMATOID FACTOR: Rheumatoid fact SerPl-aCnc: <14 IU/mL (ref <14)

## 2019-03-04 LAB — CYCLIC CITRUL PEPTIDE ANTIBODY, IGG: Cyclic Citrullin Peptide Ab: <16 UNITS

## 2019-03-04 LAB — SEDIMENTATION RATE: Sed Rate: 2 mm/h (ref 0–20)

## 2019-03-04 LAB — ANA: Anti Nuclear Antibody (ANA): NEGATIVE

## 2019-03-04 LAB — VITAMIN B12: Vitamin B-12: 479 pg/mL (ref 200–1100)

## 2019-03-04 NOTE — Progress Notes (Signed)
ANA negative. No sign of autoimmune disease. GREAT news.

## 2019-03-06 ENCOUNTER — Telehealth: Payer: Self-pay | Admitting: Neurology

## 2019-03-06 MED ORDER — TRAZODONE HCL 50 MG PO TABS: 25.0000 mg | ORAL_TABLET | Freq: Every evening | ORAL | 2 refills | Status: DC | PRN

## 2019-03-06 MED FILL — traZODone HCL 50 MG TABS: 50 | 30 days supply | Qty: 30 | Fill #0

## 2019-03-06 NOTE — Telephone Encounter (Signed)
Did she try the trazodone from her sister. Ok to send trazodone 50mg  take 1/2 tablet qhs #30 2 refills.

## 2019-03-06 NOTE — Telephone Encounter (Signed)
Patient states she has been doing the [place] 10mg , Unisom, Advil PM, and Benadryl and nothing is helping sleep. About half the time she is able to fall asleep okay and half the time she can't fall asleep. She is never able to stay asleep. She wakes up probably 5-7 times a night and is wide-awake for a range of 10 - 30 minutes before being able to fall back asleep.

## 2019-03-06 NOTE — Telephone Encounter (Signed)
She has not tried. RX sent. Patient made aware.

## 2019-03-12 ENCOUNTER — Ambulatory Visit (INDEPENDENT_AMBULATORY_CARE_PROVIDER_SITE_OTHER): Payer: No Typology Code available for payment source | Admitting: Physician Assistant

## 2019-03-12 VITALS — BP 103/78 | HR 99

## 2019-03-12 DIAGNOSIS — R0789 Other chest pain: Secondary | ICD-10-CM

## 2019-03-12 DIAGNOSIS — R61 Generalized hyperhidrosis: Secondary | ICD-10-CM | POA: Diagnosis not present

## 2019-03-12 DIAGNOSIS — R000 Tachycardia, unspecified: Secondary | ICD-10-CM | POA: Diagnosis not present

## 2019-03-12 DIAGNOSIS — F419 Anxiety disorder, unspecified: Secondary | ICD-10-CM

## 2019-03-12 DIAGNOSIS — F17203 Nicotine dependence unspecified, with withdrawal: Secondary | ICD-10-CM

## 2019-03-13 ENCOUNTER — Telehealth: Payer: Self-pay

## 2019-03-13 ENCOUNTER — Telehealth: Payer: Self-pay | Admitting: Radiology

## 2019-03-13 ENCOUNTER — Encounter: Payer: Self-pay | Admitting: Physician Assistant

## 2019-03-13 DIAGNOSIS — R61 Generalized hyperhidrosis: Secondary | ICD-10-CM

## 2019-03-13 DIAGNOSIS — R000 Tachycardia, unspecified: Secondary | ICD-10-CM

## 2019-03-13 DIAGNOSIS — F17203 Nicotine dependence unspecified, with withdrawal: Secondary | ICD-10-CM | POA: Insufficient documentation

## 2019-03-13 DIAGNOSIS — E781 Pure hyperglyceridemia: Secondary | ICD-10-CM

## 2019-03-13 DIAGNOSIS — R0789 Other chest pain: Secondary | ICD-10-CM

## 2019-03-13 NOTE — Telephone Encounter (Signed)
Enrolled patient for a 14 day Zio monitor to be mailed to patients home.   Zio At was switched to a Zio XT due to patients insurance

## 2019-03-13 NOTE — Telephone Encounter (Signed)
Ordered referral to cardiology.

## 2019-03-13 NOTE — Progress Notes (Signed)
   Subjective:    Patient ID: Vanessa Winters, female    DOB: 1995-04-02, 24 y.o.   MRN: [id number]  HPI Pt is a 24 yo female who presents to the clinic to discuss rapid variable heart rate. She has notice for many months on her apple watch that her HR will change and elevate very fast. Today has been particularly bad. She noted HR from 70-180. She feels sweaty and having some chest tightness and pressure today. She feels like her heart is pounding. She is trying to quit smoking. She has recently not been sleeping well. Her mother and father have heart murmurs but no known arrhthymias. She has had one cup of coffee today. She does feel winded but not with exertion just in general. She does have history of anxiety but does not feel like she was stressed or anxious today. She does admit to giving blood about 2 hours ago. She is actively trying to quit smoking. She has not tapered but smoking when she "needs" too. She is trying to push herself.   .. Family History  Problem Relation Age of Onset  . Hypertension Mother   . Breast cancer Mother   . Hyperlipidemia Father   . Breast cancer Maternal Grandmother   . Leukemia Paternal Grandmother   . Parkinson's disease Paternal Grandfather   . Skin cancer Other        uncle  . Heart attack Other        great uncle  . Stroke Other        great uncle      Review of Systems See HPI.     Objective:   Physical Exam Vitals reviewed.  Constitutional:      Appearance: She is diaphoretic.  Cardiovascular:     Rate and Rhythm: Regular rhythm. Tachycardia present.     Heart sounds: No murmur.     Comments: HR variable 90-110. Pulmonary:     Effort: Pulmonary effort is normal.     Breath sounds: Normal breath sounds.  Neurological:     General: No focal deficit present.     Mental Status: She is alert and oriented to person, place, and time.  Psychiatric:        Mood and Affect: Mood normal.           Assessment & Plan:  Marland KitchenMarland KitchenDiagnoses and  all orders for this visit:  Tachycardia -     EKG 12-Lead -     LONG TERM MONITOR-LIVE TELEMETRY (3-14 DAYS)  Diaphoresis -     LONG TERM MONITOR-LIVE TELEMETRY (3-14 DAYS)  Sensation of chest tightness -     LONG TERM MONITOR-LIVE TELEMETRY (3-14 DAYS)  Anxiety -     LONG TERM MONITOR-LIVE TELEMETRY (3-14 DAYS)  Nicotine withdrawal   Unclear etiology. Just gave blood hgb was good before blood draw. Recent TSH was checked and normal. EKG done in office showed no arrhthymias or acute changes. I would like for her to get zio patch and meet with cardiologist. Pt has not had any excessive caffeine intake. I do think anxiety or some withdrawal effects from nicotine are happening. Encourage her to taper smoking instead of going long periods of time without nicotine. Will monitor closely. On zoloft for mood.

## 2019-03-15 ENCOUNTER — Ambulatory Visit (INDEPENDENT_AMBULATORY_CARE_PROVIDER_SITE_OTHER): Payer: No Typology Code available for payment source

## 2019-03-15 DIAGNOSIS — R61 Generalized hyperhidrosis: Secondary | ICD-10-CM

## 2019-03-15 DIAGNOSIS — R0789 Other chest pain: Secondary | ICD-10-CM

## 2019-03-15 DIAGNOSIS — R000 Tachycardia, unspecified: Secondary | ICD-10-CM | POA: Diagnosis not present

## 2019-03-15 DIAGNOSIS — F419 Anxiety disorder, unspecified: Secondary | ICD-10-CM | POA: Diagnosis not present

## 2019-03-18 NOTE — Progress Notes (Signed)
Referring-Vanessa Breeback PA-C Reason for referral-palpitations and chest pain  HPI: 24 year old female for evaluation of chest pain and palpitations at request of Iran Planas, PA-C.  Patient states she has had intermittent palpitations for years.  She describes it as sudden onset of heart racing lasting approximately 5 minutes and then resolves.  There is dizziness but no syncope.  She has associated dyspnea and mild chest pain.  They have become more frequent recently.  Cardiology now asked to evaluate.  Note she does not have dyspnea on exertion, orthopnea, PND, pedal edema or exertional chest pain otherwise.  Current Outpatient Medications  Medication Sig Dispense Refill  . ALPRAZolam (XANAX) 0.25 MG tablet Take 0.25 mg by mouth as needed for anxiety (flying).    Marland Kitchen atorvastatin (LIPITOR) 20 MG tablet Take 1 tablet (20 mg total) by mouth at bedtime. 90 tablet 1  . Cyanocobalamin (B-12) 2500 MCG TABS Take 2,500 mcg by mouth daily.    Marland Kitchen dicyclomine (BENTYL) 20 MG tablet Take 20 mg by mouth 3 (three) times daily as needed for spasms.    Marland Kitchen etonogestrel-ethinyl estradiol (NUVARING) 0.12-0.015 MG/24HR vaginal ring Place 1 each vaginally every 28 (twenty-eight) days. Insert vaginally and leave in place for 3 consecutive weeks, then remove for 1 week. 3 each 3  . ferrous sulfate 325 (65 FE) MG tablet Take 325 mg by mouth daily with breakfast.    . meloxicam (MOBIC) 15 MG tablet Take 1 tablet (15 mg total) by mouth daily. (Patient taking differently: Take 15 mg by mouth as needed. ) 30 tablet 0  . rizatriptan (MAXALT) 10 MG tablet TAKE 1 TABLET (10 MG TOTAL) BY MOUTH AS NEEDED FOR MIGRAINE. MAY REPEAT IN 2 HOURS IF NEEDED *NO MORE THAN 2 IN 24 HRS* 10 tablet 2  . sertraline (ZOLOFT) 100 MG tablet Take 2 tablets (200 mg total) by mouth daily. 180 tablet 3  . traZODone (DESYREL) 50 MG tablet Take 0.5-1 tablets (25-50 mg total) by mouth at bedtime as needed for sleep. 30 tablet 2   No current  facility-administered medications for this visit.    Allergies  Allergen Reactions  . Adhesive [Tape] Other (See Comments)    STERI-STRIPS:  BLISTERS     Past Medical History:  Diagnosis Date  . Colon polyps   . Discharge from right nipple 07/2016  . GERD (gastroesophageal reflux disease)   . Hyperlipidemia   . Migraines   . Seasonal allergies     Past Surgical History:  Procedure Laterality Date  . BREAST DUCTAL SYSTEM EXCISION Right 08/04/2016   Procedure: RIGHT NIPPLE DUCT EXCISION;  Surgeon: Donnie Mesa, MD;  Location: [facility];  Service: General;  Laterality: Right;  . WISDOM TOOTH EXTRACTION      Social History   Socioeconomic History  . Marital status: Married    Spouse name: Not on file  . Number of children: Not on file  . Years of education: Not on file  . Highest education level: Not on file  Occupational History  . Not on file  Tobacco Use  . Smoking status: Current Every Day Smoker    Packs/day: 0.50    Years: 6.00    Pack years: 3.00    Types: Cigarettes  . Smokeless tobacco: Never Used  Substance and Sexual Activity  . Alcohol use: Yes    Comment: Occasional  . Drug use: Never  . Sexual activity: Yes    Partners: Male    Birth control/protection: Inserts  Comment: nuvaring  Other Topics Concern  . Not on file  Social History Narrative  . Not on file   Social Determinants of Health   Financial Resource Strain:   . Difficulty of Paying Living Expenses: Not on file  Food Insecurity:   . Worried About Programme researcher, broadcasting/film/video in the Last Year: Not on file  . Ran Out of Food in the Last Year: Not on file  Transportation Needs:   . Lack of Transportation (Medical): Not on file  . Lack of Transportation (Non-Medical): Not on file  Physical Activity:   . Days of Exercise per Week: Not on file  . Minutes of Exercise per Session: Not on file  Stress:   . Feeling of Stress : Not on file  Social Connections:   . Frequency of  Communication with Friends and Family: Not on file  . Frequency of Social Gatherings with Friends and Family: Not on file  . Attends Religious Services: Not on file  . Active Member of Clubs or Organizations: Not on file  . Attends Banker Meetings: Not on file  . Marital Status: Not on file  Intimate Partner Violence: Not At Risk  . Fear of Current or Ex-Partner: No  . Emotionally Abused: No  . Physically Abused: No  . Sexually Abused: No    Family History  Problem Relation Age of Onset  . Hypertension Mother   . Breast cancer Mother   . Hyperlipidemia Father   . Breast cancer Maternal Grandmother   . Leukemia Paternal Grandmother   . Parkinson's disease Paternal Grandfather   . Skin cancer Other        uncle  . Heart attack Other        great uncle  . Stroke Other        great uncle    ROS: no fevers or chills, productive cough, hemoptysis, dysphasia, odynophagia, melena, hematochezia, dysuria, hematuria, rash, seizure activity, orthopnea, PND, pedal edema, claudication. Remaining systems are negative.  Physical Exam:   Blood pressure 116/76, pulse (!) 106, height 5' 1.75" (1.568 m), weight 136 lb 6.4 oz (61.9 kg).  General:  Well developed/well nourished in NAD Skin warm/dry Patient not depressed No peripheral clubbing Back-normal HEENT-normal/normal eyelids Neck supple/normal carotid upstroke bilaterally; no bruits; no JVD; no thyromegaly chest - CTA/ normal expansion CV - RRR/normal S1 and S2; no murmurs, rubs or gallops;  PMI nondisplaced Abdomen -NT/ND, no HSM, no mass, + bowel sounds, no bruit 2+ femoral pulses, no bruits Ext-no edema, chords, 2+ DP Neuro-grossly nonfocal  ECG -March 12, 2019-sinus rhythm with nonspecific ST changes.  Personally reviewed  A/P  1 palpitations-etiology unclear.  She has a monitor in place that was ordered by primary care.  We will await the results.  I will arrange an echocardiogram to assess LV function.  She  had a TSH in June that was normal.  2 chest pain-mild chest heaviness with palpitations but otherwise no exertional chest pain.  No plans for further ischemia evaluation.  3 hyperlipidemia-continue statin.  4 tobacco abuse-patient counseled on discontinuing.  Olga Millers, MD

## 2019-03-20 ENCOUNTER — Other Ambulatory Visit: Payer: Self-pay

## 2019-03-20 ENCOUNTER — Encounter: Payer: Self-pay | Admitting: Cardiology

## 2019-03-20 ENCOUNTER — Ambulatory Visit (INDEPENDENT_AMBULATORY_CARE_PROVIDER_SITE_OTHER): Payer: No Typology Code available for payment source | Admitting: Cardiology

## 2019-03-20 VITALS — BP 116/76 | HR 106 | Ht 61.75 in | Wt 136.4 lb

## 2019-03-20 DIAGNOSIS — Z720 Tobacco use: Secondary | ICD-10-CM

## 2019-03-20 DIAGNOSIS — R072 Precordial pain: Secondary | ICD-10-CM

## 2019-03-20 DIAGNOSIS — R002 Palpitations: Secondary | ICD-10-CM

## 2019-03-20 DIAGNOSIS — E7800 Pure hypercholesterolemia, unspecified: Secondary | ICD-10-CM | POA: Diagnosis not present

## 2019-03-20 NOTE — Patient Instructions (Signed)
Medication Instructions:  NO CHANGE *If you need a refill on your cardiac medications before your next appointment, please call your pharmacy*  Lab Work: If you have labs (blood work) drawn today and your tests are completely normal, you will receive your results only by: Marland Kitchen MyChart Message (if you have MyChart) OR . A paper copy in the mail If you have any lab test that is abnormal or we need to change your treatment, we will call you to review the results.  Testing/Procedures: Your physician has requested that you have an echocardiogram. Echocardiography is a painless test that uses sound waves to create images of your heart. It provides your doctor with information about the size and shape of your heart and how well your heart's chambers and valves are working. This procedure takes approximately one hour. There are no restrictions for this procedure.[id number] TO SCHEDULE IN THE [place] OFFICE    Follow-Up: At [facility], you and your health needs are our priority.  As part of our continuing mission to provide you with exceptional heart care, we have created designated Provider Care Teams.  These Care Teams include your primary Cardiologist (physician) and Advanced Practice Providers (APPs -  Physician Assistants and Nurse Practitioners) who all work together to provide you with the care you need, when you need it.  Your next appointment:   8 week(s)  The format for your next appointment:   In Person  Provider:   Olga Millers, MD

## 2019-03-22 DIAGNOSIS — R41840 Attention and concentration deficit: Secondary | ICD-10-CM

## 2019-03-22 DIAGNOSIS — F419 Anxiety disorder, unspecified: Secondary | ICD-10-CM

## 2019-03-22 DIAGNOSIS — R4589 Other symptoms and signs involving emotional state: Secondary | ICD-10-CM

## 2019-03-29 ENCOUNTER — Other Ambulatory Visit: Payer: Self-pay

## 2019-03-29 ENCOUNTER — Ambulatory Visit (HOSPITAL_BASED_OUTPATIENT_CLINIC_OR_DEPARTMENT_OTHER)
Admission: RE | Admit: 2019-03-29 | Discharge: 2019-03-29 | Disposition: A | Discharge status: Home / Self Care | Payer: No Typology Code available for payment source | Source: Ambulatory Visit | Attending: Cardiology | Admitting: Cardiology

## 2019-03-29 DIAGNOSIS — R002 Palpitations: Secondary | ICD-10-CM | POA: Insufficient documentation

## 2019-03-29 MED FILL — RIZATRIPTAN BENZOATE 10 MG: 10 | 17 days supply | Qty: 10 | Fill #1

## 2019-03-29 NOTE — Progress Notes (Signed)
  Echocardiogram 2D Echocardiogram has been performed.  Vanessa Winters 03/29/2019, 10:10 AM

## 2019-04-01 ENCOUNTER — Telehealth: Payer: Self-pay | Admitting: Physician Assistant

## 2019-04-01 DIAGNOSIS — R4589 Other symptoms and signs involving emotional state: Secondary | ICD-10-CM | POA: Insufficient documentation

## 2019-04-01 DIAGNOSIS — R41840 Attention and concentration deficit: Secondary | ICD-10-CM | POA: Insufficient documentation

## 2019-04-01 DIAGNOSIS — F419 Anxiety disorder, unspecified: Secondary | ICD-10-CM

## 2019-04-01 MED ORDER — BUSPIRONE HCL 7.5 MG PO TABS: 7.5000 mg | ORAL_TABLET | Freq: Two times a day (BID) | ORAL | 2 refills | Status: DC

## 2019-04-01 MED FILL — BUSPIRONE HCL 7.5 MG TABS: 7.5 | 30 days supply | Qty: 60 | Fill #0

## 2019-04-01 NOTE — Telephone Encounter (Signed)
Referrals

## 2019-04-01 NOTE — Telephone Encounter (Signed)
Noticed some ADHD symptoms.  Self screening was positive.  Pt did not really have all these symptoms before being put on zoloft.  Will send for formal testing.  Anxiety has worsened. Will add buspar bid.

## 2019-04-03 NOTE — Progress Notes (Signed)
No pathological rhythms per crenshaw. You do have slow and fast heart rates with some premature atrial and ventricular contractions. I am sure he will discuss with you. Since they are symptomatic he may want to try low dose BB or CCB. I defer to him for management and suggestions.

## 2019-04-04 DIAGNOSIS — Z818 Family history of other mental and behavioral disorders: Secondary | ICD-10-CM

## 2019-04-04 DIAGNOSIS — R4586 Emotional lability: Secondary | ICD-10-CM

## 2019-04-04 DIAGNOSIS — R4681 Obsessive-compulsive behavior: Secondary | ICD-10-CM

## 2019-04-04 DIAGNOSIS — F419 Anxiety disorder, unspecified: Secondary | ICD-10-CM

## 2019-04-04 DIAGNOSIS — R41840 Attention and concentration deficit: Secondary | ICD-10-CM

## 2019-04-04 DIAGNOSIS — R4589 Other symptoms and signs involving emotional state: Secondary | ICD-10-CM

## 2019-04-08 ENCOUNTER — Other Ambulatory Visit: Payer: Self-pay | Admitting: Physician Assistant

## 2019-04-08 ENCOUNTER — Other Ambulatory Visit (INDEPENDENT_AMBULATORY_CARE_PROVIDER_SITE_OTHER): Payer: No Typology Code available for payment source | Admitting: Physician Assistant

## 2019-04-08 ENCOUNTER — Ambulatory Visit (INDEPENDENT_AMBULATORY_CARE_PROVIDER_SITE_OTHER): Payer: No Typology Code available for payment source

## 2019-04-08 ENCOUNTER — Other Ambulatory Visit: Payer: Self-pay

## 2019-04-08 DIAGNOSIS — K5900 Constipation, unspecified: Secondary | ICD-10-CM

## 2019-04-08 DIAGNOSIS — R194 Change in bowel habit: Secondary | ICD-10-CM

## 2019-04-08 DIAGNOSIS — R1030 Lower abdominal pain, unspecified: Secondary | ICD-10-CM

## 2019-04-08 DIAGNOSIS — R198 Other specified symptoms and signs involving the digestive system and abdomen: Secondary | ICD-10-CM

## 2019-04-08 DIAGNOSIS — K5909 Other constipation: Secondary | ICD-10-CM | POA: Diagnosis not present

## 2019-04-08 IMAGING — DX DG ABDOMEN 2V
3 series · 3 of 3 positions shown · non-contrast
Comparison: Abdominal x-ray dated [DATE].

CLINICAL DATA: Lower abdominal cramping and constipation.

EXAM:
ABDOMEN - 2 VIEW

[abdomen erect]
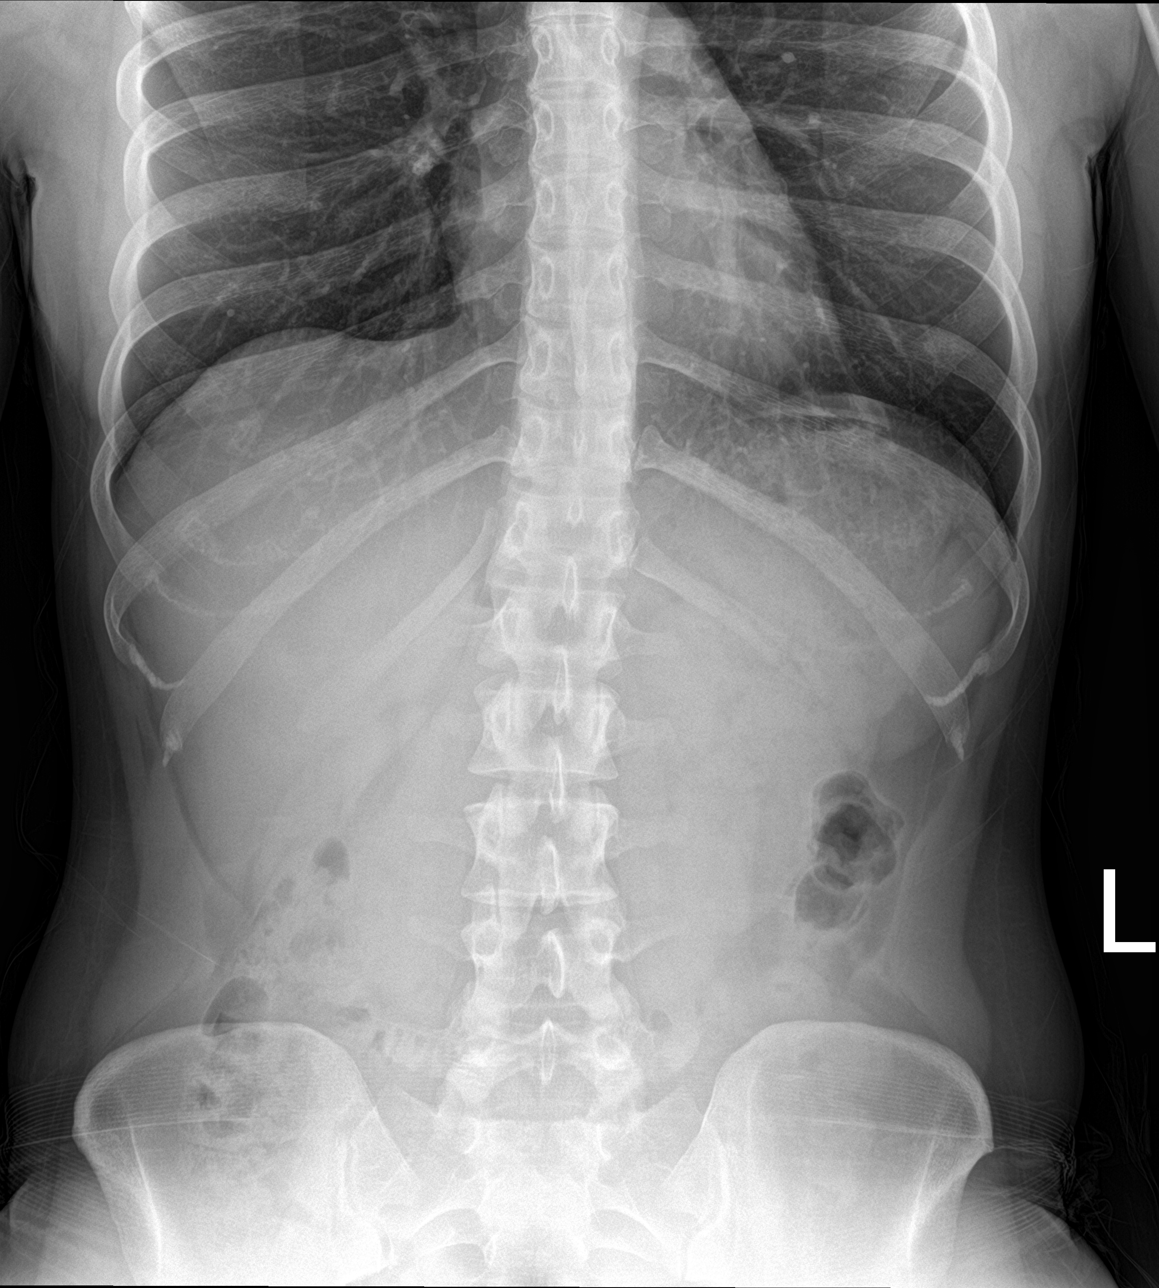

[abdomen supine (1 of 2)]
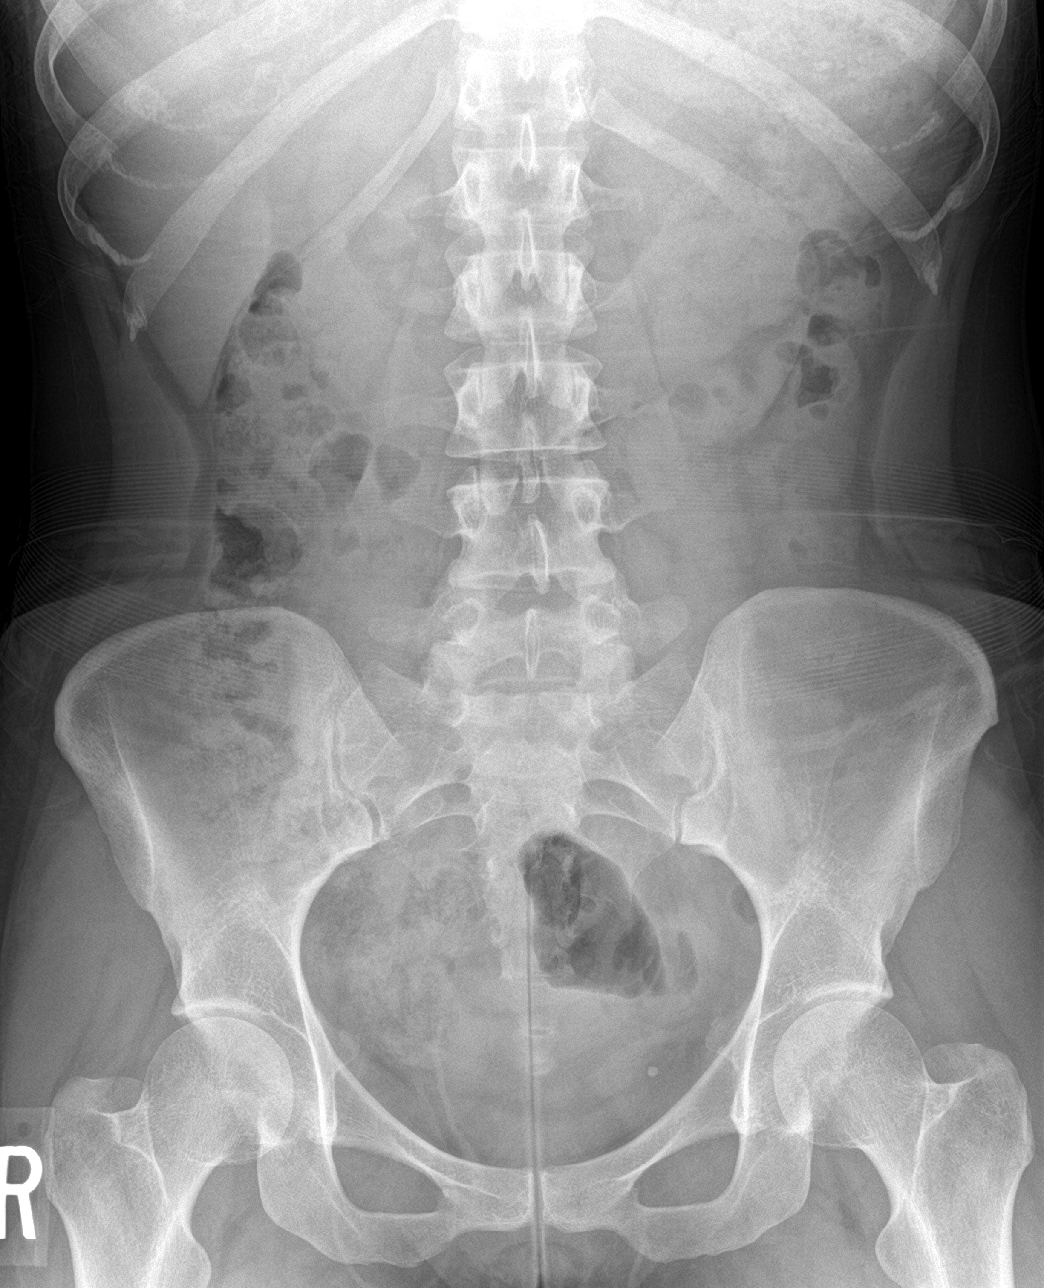

[abdomen supine (2 of 2)]
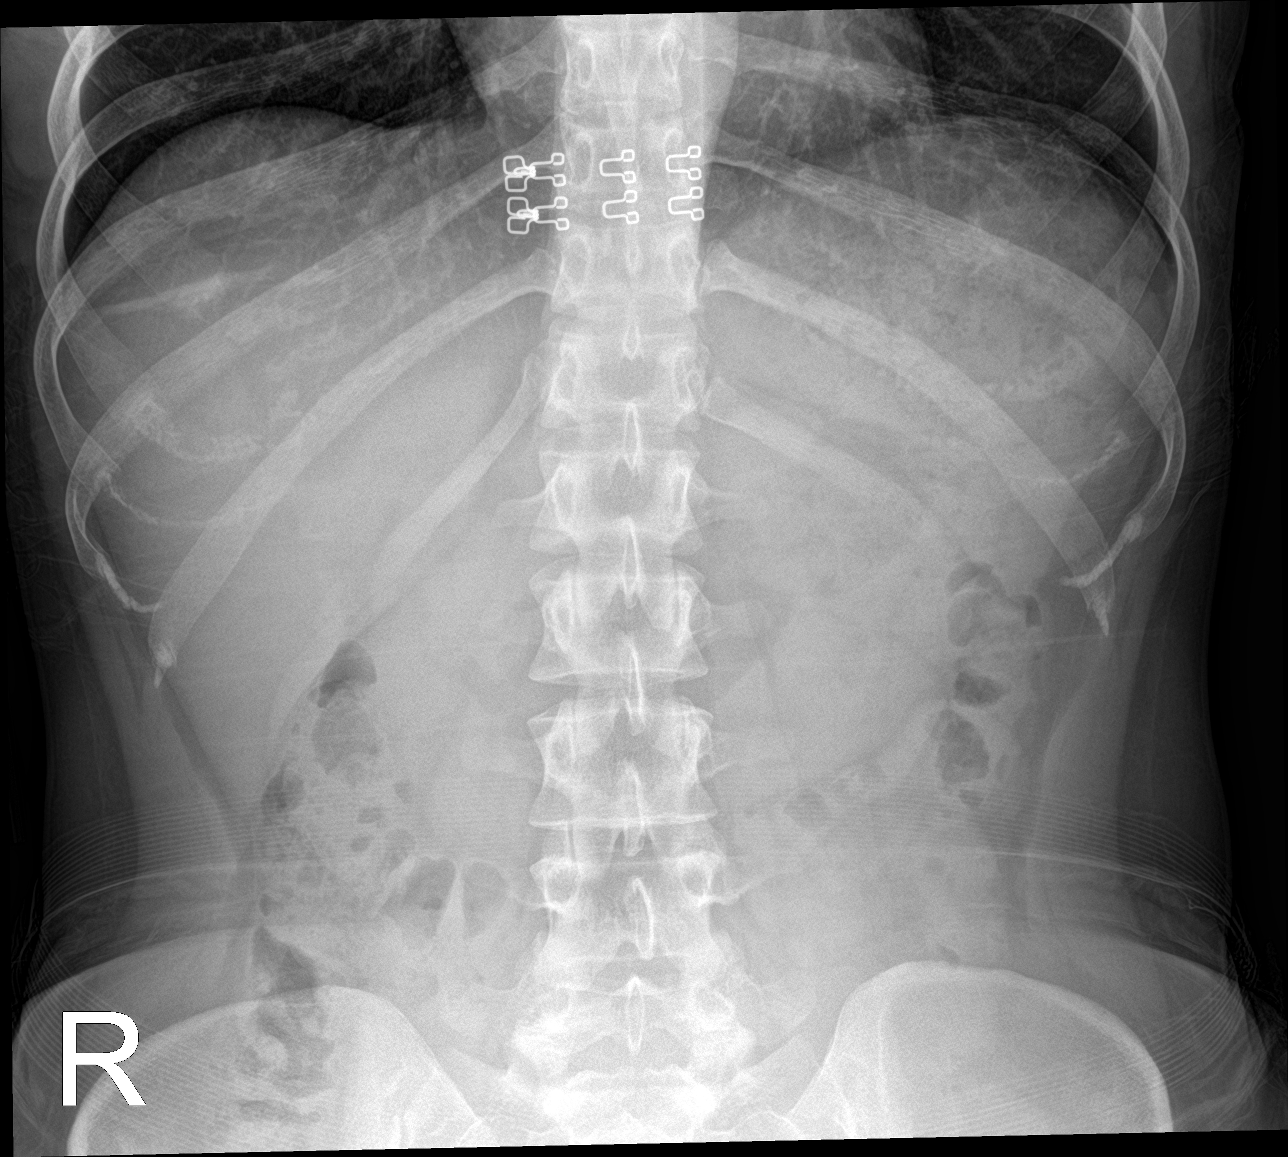

[3 of 3 positions shown; findings below may reference images not displayed]

FINDINGS: The bowel gas pattern is normal. Mildly increased stool burden in
the cecum. There is no evidence of free air. No radio-opaque calculi
or other significant radiographic abnormality is seen. No acute
osseous abnormality.
IMPRESSION: Mildly increased stool burden in the cecum.  No acute findings.

## 2019-04-08 NOTE — Progress Notes (Signed)
Already discussed with patient. Miralax for the next few days for constipation.

## 2019-04-08 NOTE — Progress Notes (Signed)
Xray showed constipation. Start there. Have a few good bowel movements and then follow up.

## 2019-04-08 NOTE — Progress Notes (Signed)
Pt comes in this am with 3 days of no bowel movement until this morning a small amt of mustard stool. She reports clear discharge coming from anus. No pain rectally. Lower abdominal cramping. No anal sex for a years. Only one partner with her husband. No blood in stool. Will get xray. Start miralax for a full bowel movement.

## 2019-04-10 ENCOUNTER — Other Ambulatory Visit: Payer: Self-pay

## 2019-04-10 ENCOUNTER — Encounter: Payer: Self-pay | Admitting: Physician Assistant

## 2019-04-10 ENCOUNTER — Ambulatory Visit (INDEPENDENT_AMBULATORY_CARE_PROVIDER_SITE_OTHER): Payer: No Typology Code available for payment source

## 2019-04-10 ENCOUNTER — Ambulatory Visit (INDEPENDENT_AMBULATORY_CARE_PROVIDER_SITE_OTHER): Payer: No Typology Code available for payment source | Admitting: Physician Assistant

## 2019-04-10 VITALS — BP 109/68 | HR 76 | Temp 98.2°F | Ht 61.75 in | Wt 126.0 lb

## 2019-04-10 DIAGNOSIS — Z8719 Personal history of other diseases of the digestive system: Secondary | ICD-10-CM | POA: Insufficient documentation

## 2019-04-10 DIAGNOSIS — R1032 Left lower quadrant pain: Secondary | ICD-10-CM | POA: Insufficient documentation

## 2019-04-10 DIAGNOSIS — K5900 Constipation, unspecified: Secondary | ICD-10-CM | POA: Diagnosis not present

## 2019-04-10 DIAGNOSIS — R1915 Other abnormal bowel sounds: Secondary | ICD-10-CM | POA: Diagnosis not present

## 2019-04-10 IMAGING — CT CT ABD-PELV W/ CM
2 of 4 series · 16 of 46 positions shown, 18 images · IV contrast (APPLIED)
Comparison: [DATE]

CLINICAL DATA: Lower abdominal pain

EXAM:
CT ABDOMEN AND PELVIS WITH CONTRAST
TECHNIQUE: Multidetector CT imaging of the abdomen and pelvis was performed
using the standard protocol following bolus administration of
intravenous contrast. Oral contrast was also administered.
CONTRAST:  100mL OMNIPAQUE IOHEXOL 300 MG/ML  SOLN

[Series 2: axial st · axial · 0.67mm/px · z∈[+660,+1060]mm · 13 of 88 slices shown, 15 images]
[im 4/88  soft-tissue]
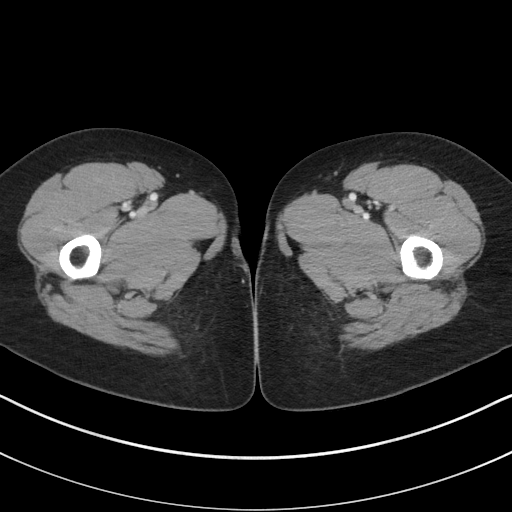
[im 4/88  bone]
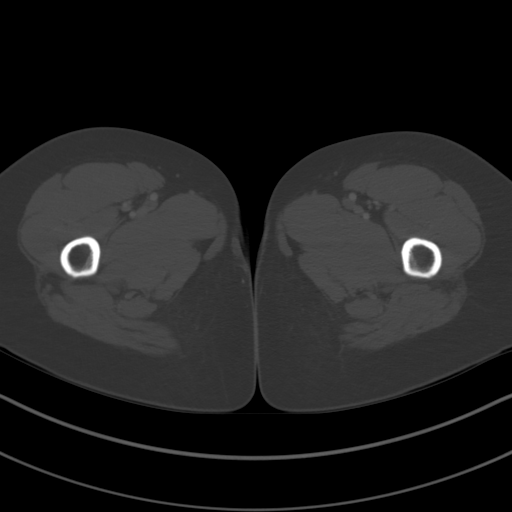
[im 11/88  soft-tissue]
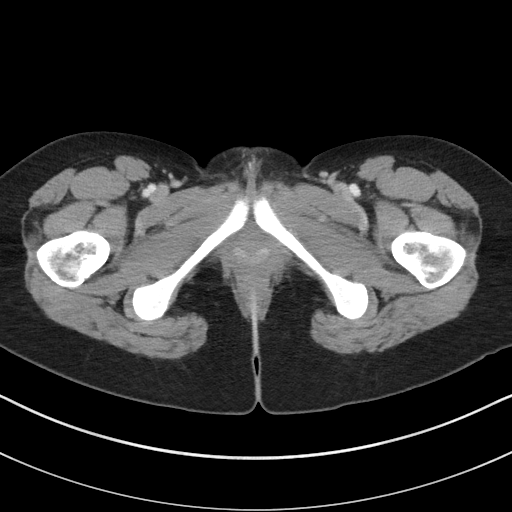
[im 18/88  soft-tissue]
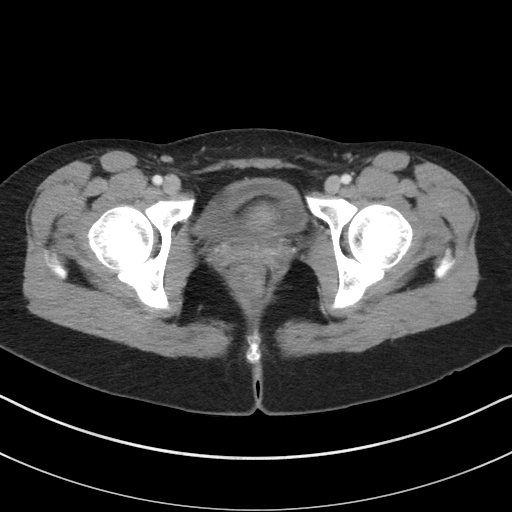
[im 25/88  soft-tissue]
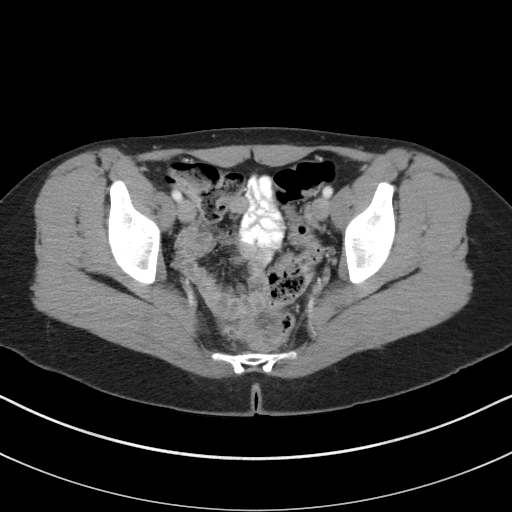
[im 32/88  soft-tissue]
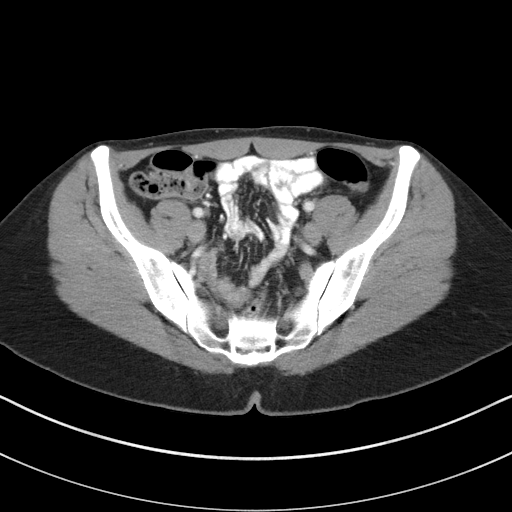
[im 39/88  soft-tissue]
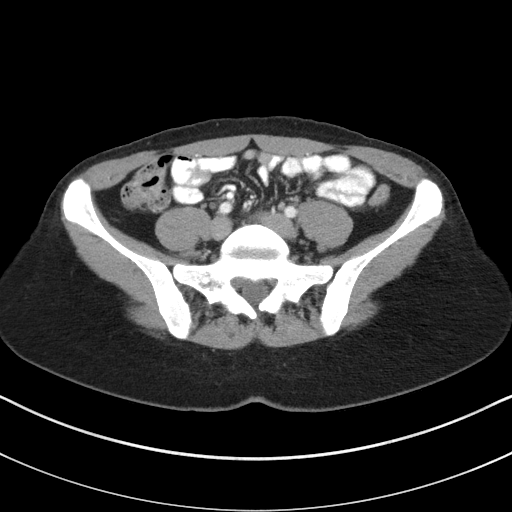
[im 46/88  soft-tissue]
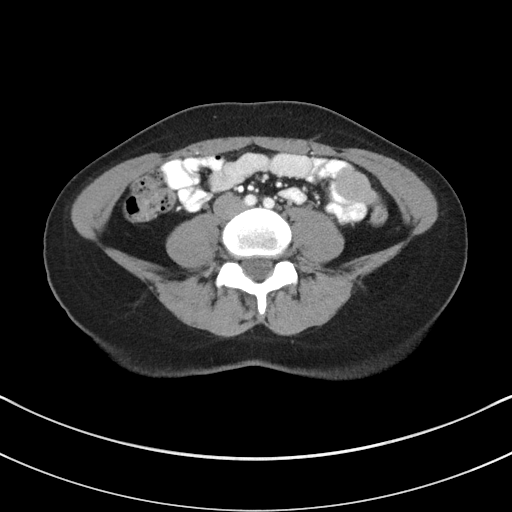
[im 49/88  soft-tissue]
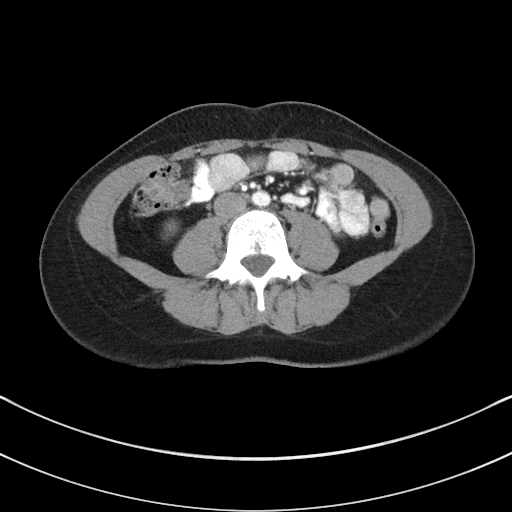
[im 56/88  soft-tissue]
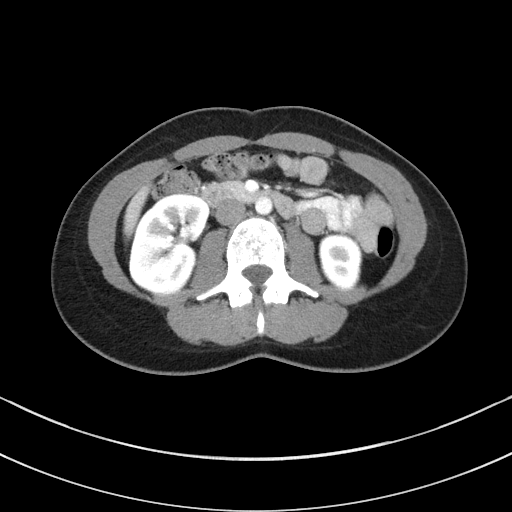
[im 56/88  bone]
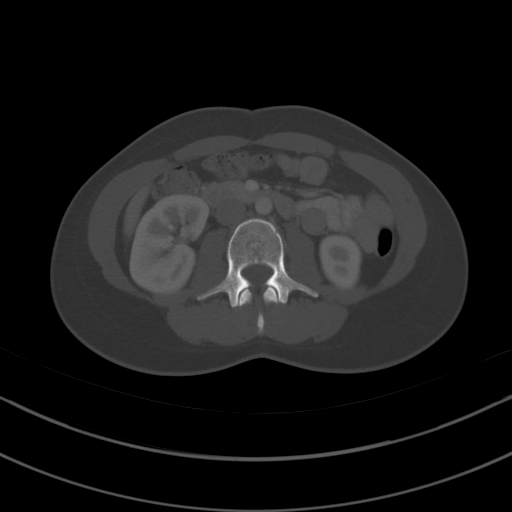
[im 63/88  soft-tissue]
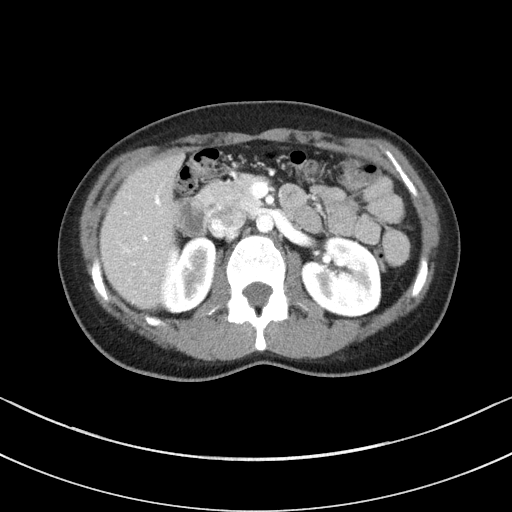
[im 70/88  soft-tissue]
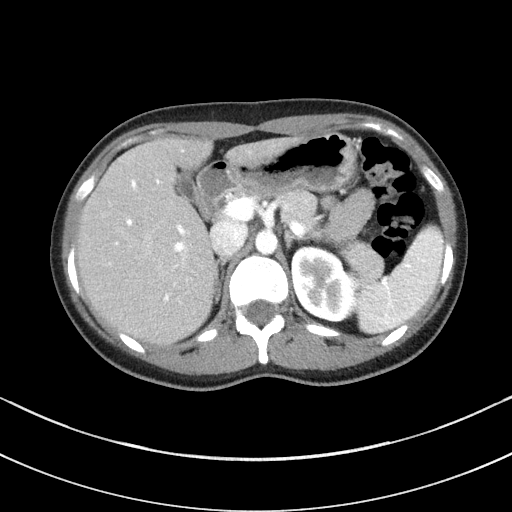
[im 77/88  soft-tissue]
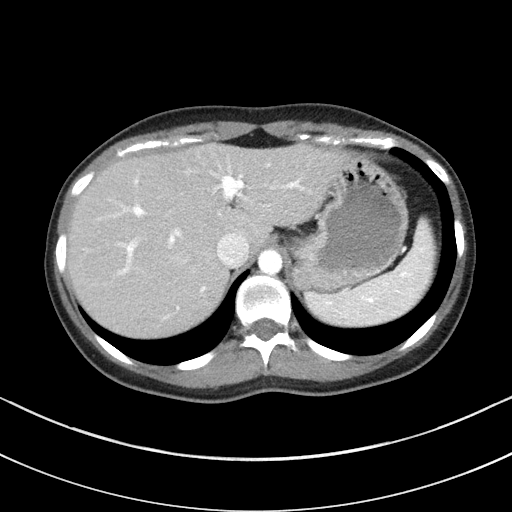
[im 84/88  soft-tissue]
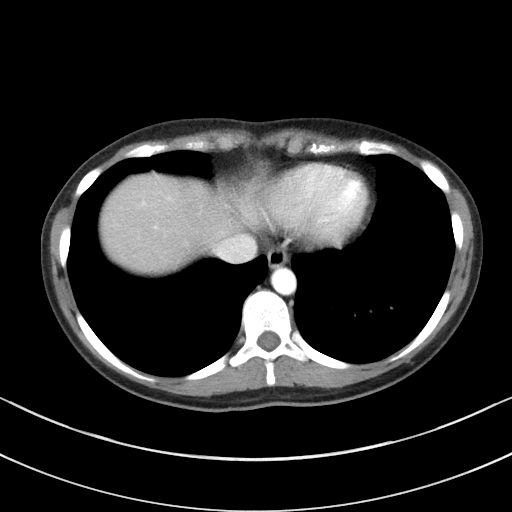

[Series 4: coronal st · coronal · 0.71mm/px · 3 of 68 slices shown]
[im 23/68  soft-tissue]
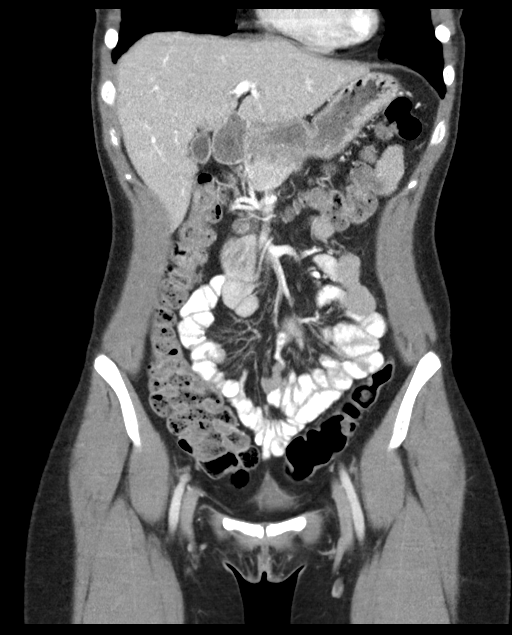
[im 30/68  soft-tissue]
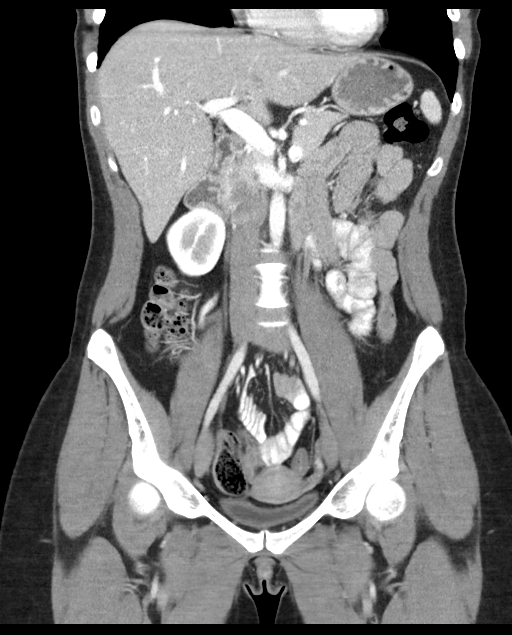
[im 38/68  soft-tissue]
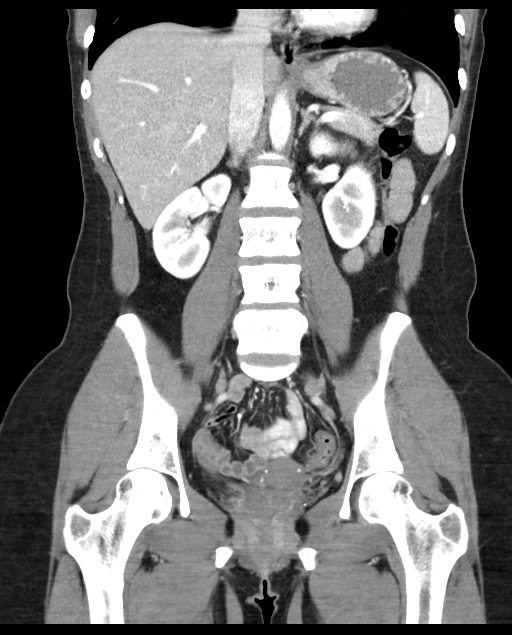

[16 of 46 positions shown; findings below may reference images not displayed]

FINDINGS: Lower chest: Lung bases are clear.

Hepatobiliary: There is hepatic steatosis. The gallbladder wall is
not appreciably thickened. There is no biliary duct dilatation.

Pancreas: There is no pancreatic mass or inflammatory focus.

Spleen: No splenic lesions are evident.

Adrenals/Urinary Tract: Adrenals bilaterally appear normal. Kidneys
bilaterally show no evident mass or hydronephrosis on either side.
There is no evident renal or ureteral calculus on either side.
Urinary bladder is midline with wall thickness within normal limits.

Stomach/Bowel: There is no appreciable bowel wall or mesenteric
thickening. Terminal ileum appears unremarkable. No bowel
obstruction. No free air or portal venous air.

Vascular/Lymphatic: No abdominal aortic aneurysm. No vascular
lesions evident. Major venous structures appear normal. No
adenopathy is evident in the abdomen or pelvis.

Reproductive: Uterus is anteverted.  No evident pelvic mass.

Other: Appendix appears normal. There is no evident abscess or
ascites in the abdomen or pelvis.

Musculoskeletal: No blastic or lytic bone lesions. There is no
intramuscular or abdominal wall lesion.
IMPRESSION: 1.  Hepatic steatosis.

2. No bowel wall thickening or bowel obstruction. No appreciable
diverticular disease. No abscess in the abdomen or pelvis. Appendix
appears normal.

3. No renal or ureteral calculus. No hydronephrosis. Urinary bladder
wall thickness within normal limits.

## 2019-04-10 MED ORDER — IOHEXOL 300 MG/ML  SOLN
100.0000 mL | Freq: Once | INTRAMUSCULAR | Status: AC | PRN
  Administered 2019-04-10: 12:00:00 100 mL via INTRAVENOUS

## 2019-04-10 NOTE — Progress Notes (Signed)
Discussed with patient in office.  Continue with mag citrate. Stop buspar.

## 2019-04-10 NOTE — Progress Notes (Addendum)
Subjective:    Patient ID: Vanessa Winters, female    DOB: [birth year], 24 y.o.   MRN: [id number]  HPI  Pt is a 24 yo female who presents to the clinic today with worsening periumbilical and left lower quadrant pain, rectal discharge, constipation. Over the weekend she developed constipation and clear rectal discharge. She originally was not having any abdominal pain.  On Monday she came to the clinic and abdominal xray was ordered that showed constipation. She start laxatives, juice and mineral oil. Yesterday she had loose stools her abdominal pain continues to worsen. Today she has not had gas or bowel movement and 6/10 abdominal pain. Denies any blood or mucus in stools. No fever, chills, body aches. In 2018 she had similar symptoms where she was dx with colitis. She was sent to GI and had colonoscopy and endoscopy. She was told she had IBS. She was put on bentyl and has not had major problems since other than diarrhea intermittently.   ... Active Ambulatory Problems    Diagnosis Date Noted  . Hyperlipidemia 05/22/2011  . History of migraine headaches 02/18/2013  . Irregular menses 02/18/2013  . Hidradenitis axillaris 11/07/2014  . Discharge from right nipple 05/19/2016  . Tobacco abuse 07/25/2018  . Obsessive-compulsive behavior 07/25/2018  . Anxiety 07/25/2018  . No energy 07/25/2018  . Intractable migraine with aura without status migrainosus 07/25/2018  . Trouble in sleeping 03/04/2019  . 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 Arthralgia 03/04/2019  . Hot flashes 03/04/2019  . Nicotine withdrawal 03/13/2019  . Fidgeting 04/01/2019  . Inattention 04/01/2019  . Hypoactive bowel sounds 04/10/2019  . History of colitis 04/10/2019  . Left lower quadrant pain 04/10/2019  . Constipation 04/10/2019   Resolved Ambulatory Problems    Diagnosis Date Noted  . No Resolved Ambulatory Problems   Past Medical History:  Diagnosis Date  .  Colon polyps   . GERD (gastroesophageal reflux disease)   . Migraines   . Seasonal allergies        Review of Systems See HPI.     Objective:   Physical Exam Vitals reviewed.  Constitutional:      Appearance: She is well-developed.  Cardiovascular:     Rate and Rhythm: Normal rate and regular rhythm.  Pulmonary:     Effort: Pulmonary effort is normal.  Abdominal:     General: Bowel sounds are decreased. There is distension.     Tenderness: There is abdominal tenderness in the periumbilical area and left lower quadrant. There is guarding. There is no right CVA tenderness, left CVA tenderness or rebound.     Hernia: No hernia is present.  Neurological:     General: No focal deficit present.     Mental Status: She is alert.           Assessment & Plan:  Marland KitchenMarland KitchenEloina was seen today for abdominal pain.  Diagnoses and all orders for this visit:  Left lower quadrant pain -     CT Abdomen Pelvis W Contrast; Future -     CT Abdomen Pelvis W Contrast  History of colitis -     CT Abdomen Pelvis W Contrast; Future -     CT Abdomen Pelvis W Contrast  Constipation, unspecified constipation type -     CT Abdomen Pelvis W Contrast; Future -     CT Abdomen Pelvis W Contrast  Hypoactive bowel sounds   Xray 2 days ago showed constipation. Pt has  been taking laxatives. Concerned with little to no bowel sounds, worsening abdominal pain, lack of gas or bowel movement. Need STAT CT.   Reviewed labs, office visit, CT, colonoscopy, endoscopy from 2018.  No evidence of infection or IBD.   CT negative for any acute findings. Does have some fatty liver and on personal interpretation some constipation in lower cecum and right colon.  Start magnesium citrate with gaterade for constipation. Only new medication is buspar. Stop buspar.   Follow up in 1 week.

## 2019-04-16 DIAGNOSIS — R4589 Other symptoms and signs involving emotional state: Secondary | ICD-10-CM

## 2019-04-16 DIAGNOSIS — F419 Anxiety disorder, unspecified: Secondary | ICD-10-CM

## 2019-04-16 DIAGNOSIS — R4681 Obsessive-compulsive behavior: Secondary | ICD-10-CM

## 2019-04-16 DIAGNOSIS — Z818 Family history of other mental and behavioral disorders: Secondary | ICD-10-CM

## 2019-04-16 DIAGNOSIS — R41840 Attention and concentration deficit: Secondary | ICD-10-CM

## 2019-04-16 MED FILL — ETONOGESTREL-ETHINYL ESTRAD: 0.12-0.015 | 84 days supply | Qty: 3 | Fill #3

## 2019-04-16 MED FILL — traZODone HCL 50 MG TABS: 50 | 30 days supply | Qty: 30 | Fill #1

## 2019-04-16 NOTE — Progress Notes (Signed)
HPI: Follow-up palpitations and chest pain.  Echocardiogram February 2021 showed normal LV function.  Monitor February 2021 showed sinus rhythm with rare PAC, PVC and isolated couplet.  Since last seen there is no dyspnea or syncope.  She continues to have occasional palpitations described as a quiver.  When she has these she has associated chest discomfort.  Current Outpatient Medications  Medication Sig Dispense Refill  . ALPRAZolam (XANAX) 0.25 MG tablet Take 0.25 mg by mouth as needed for anxiety (flying).    Marland Kitchen atorvastatin (LIPITOR) 20 MG tablet Take 1 tablet (20 mg total) by mouth at bedtime. 90 tablet 1  . dicyclomine (BENTYL) 20 MG tablet Take 20 mg by mouth 3 (three) times daily as needed for spasms.    Marland Kitchen etonogestrel-ethinyl estradiol (NUVARING) 0.12-0.015 MG/24HR vaginal ring Place 1 each vaginally every 28 (twenty-eight) days. Insert vaginally and leave in place for 3 consecutive weeks, then remove for 1 week. 3 each 3  . rizatriptan (MAXALT) 10 MG tablet TAKE 1 TABLET (10 MG TOTAL) BY MOUTH AS NEEDED FOR MIGRAINE. MAY REPEAT IN 2 HOURS IF NEEDED *NO MORE THAN 2 IN 24 HRS* 10 tablet 2  . sertraline (ZOLOFT) 100 MG tablet Take 2 tablets (200 mg total) by mouth daily. 180 tablet 3  . traZODone (DESYREL) 50 MG tablet Take 0.5-1 tablets (25-50 mg total) by mouth at bedtime as needed for sleep. 30 tablet 2   No current facility-administered medications for this visit.     Past Medical History:  Diagnosis Date  . Colon polyps   . Discharge from right nipple 07/2016  . GERD (gastroesophageal reflux disease)   . Hyperlipidemia   . Migraines   . Seasonal allergies     Past Surgical History:  Procedure Laterality Date  . BREAST DUCTAL SYSTEM EXCISION Right 08/04/2016   Procedure: RIGHT NIPPLE DUCT EXCISION;  Surgeon: Manus Rudd, MD;  Location: [facility];  Service: General;  Laterality: Right;  . WISDOM TOOTH EXTRACTION      Social History    Socioeconomic History  . Marital status: Married    Spouse name: Not on file  . Number of children: Not on file  . Years of education: Not on file  . Highest education level: Not on file  Occupational History  . Not on file  Tobacco Use  . Smoking status: Current Every Day Smoker    Packs/day: 0.50    Years: 6.00    Pack years: 3.00    Types: Cigarettes, E-cigarettes  . Smokeless tobacco: Never Used  Substance and Sexual Activity  . Alcohol use: Yes    Comment: Occasional  . Drug use: Never  . Sexual activity: Yes    Partners: Male    Birth control/protection: Inserts    Comment: nuvaring  Other Topics Concern  . Not on file  Social History Narrative  . Not on file   Social Determinants of Health   Financial Resource Strain:   . Difficulty of Paying Living Expenses: Not on file  Food Insecurity:   . Worried About Programme researcher, broadcasting/film/video in the Last Year: Not on file  . Ran Out of Food in the Last Year: Not on file  Transportation Needs:   . Lack of Transportation (Medical): Not on file  . Lack of Transportation (Non-Medical): Not on file  Physical Activity:   . Days of Exercise per Week: Not on file  . Minutes of Exercise per Session: Not on file  Stress:   . Feeling of Stress : Not on file  Social Connections:   . Frequency of Communication with Friends and Family: Not on file  . Frequency of Social Gatherings with Friends and Family: Not on file  . Attends Religious Services: Not on file  . Active Member of Clubs or Organizations: Not on file  . Attends Archivist Meetings: Not on file  . Marital Status: Not on file  Intimate Partner Violence: Not At Risk  . Fear of Current or Ex-Partner: No  . Emotionally Abused: No  . Physically Abused: No  . Sexually Abused: No    Family History  Problem Relation Age of Onset  . Hypertension Mother   . Breast cancer Mother   . Hyperlipidemia Father   . Breast cancer Maternal Grandmother   . Leukemia  Paternal Grandmother   . Parkinson's disease Paternal Grandfather   . Skin cancer Other        uncle  . Heart attack Other        great uncle  . Stroke Other        great uncle    ROS: no fevers or chills, productive cough, hemoptysis, dysphasia, odynophagia, melena, hematochezia, dysuria, hematuria, rash, seizure activity, orthopnea, PND, pedal edema, claudication. Remaining systems are negative.  Physical Exam: Well-developed well-nourished in no acute distress.  Skin is warm and dry.  HEENT is normal.  Neck is supple.  Chest is clear to auscultation with normal expansion.  Cardiovascular exam is regular rate and rhythm.  Abdominal exam nontender or distended. No masses palpated. Extremities show no edema. neuro grossly intact   A/P  1 palpitations-patient continues to have problems with palpitations.  We will add Toprol 25 mg nightly for symptomatic relief.  Note her LV function is normal and monitor showed PACs, PVCs and couplet.  2 chest pain-symptoms only occur with palpitations but no other exertional symptoms.  No plans for further ischemia evaluation.  3 hyperlipidemia-continue statin.  4 tobacco abuse-patient has discontinued.  Kirk Ruths, MD

## 2019-04-17 ENCOUNTER — Other Ambulatory Visit: Payer: Self-pay

## 2019-04-17 ENCOUNTER — Ambulatory Visit (INDEPENDENT_AMBULATORY_CARE_PROVIDER_SITE_OTHER): Payer: No Typology Code available for payment source | Admitting: Cardiology

## 2019-04-17 ENCOUNTER — Encounter: Payer: Self-pay | Admitting: Cardiology

## 2019-04-17 VITALS — BP 112/72 | HR 88 | Ht 61.75 in | Wt 134.0 lb

## 2019-04-17 DIAGNOSIS — E7800 Pure hypercholesterolemia, unspecified: Secondary | ICD-10-CM

## 2019-04-17 DIAGNOSIS — R072 Precordial pain: Secondary | ICD-10-CM | POA: Diagnosis not present

## 2019-04-17 DIAGNOSIS — R002 Palpitations: Secondary | ICD-10-CM | POA: Diagnosis not present

## 2019-04-17 MED ORDER — METOPROLOL SUCCINATE ER 25 MG PO TB24: 25.0000 mg | ORAL_TABLET | Freq: Every day | ORAL | 3 refills | Status: DC

## 2019-04-17 MED FILL — METOPROLOL SUCCINATE ER 25: 25 | 90 days supply | Qty: 90 | Fill #0

## 2019-04-17 NOTE — Patient Instructions (Signed)
Medication Instructions:  START METOPROLOL SUCC ER 25 MG ONCE DAILY  *If you need a refill on your cardiac medications before your next appointment, please call your pharmacy*  Lab Work: If you have labs (blood work) drawn today and your tests are completely normal, you will receive your results only by: Marland Kitchen MyChart Message (if you have MyChart) OR . A paper copy in the mail If you have any lab test that is abnormal or we need to change your treatment, we will call you to review the results.  Follow-Up: At [facility], you and your health needs are our priority.  As part of our continuing mission to provide you with exceptional heart care, we have created designated Provider Care Teams.  These Care Teams include your primary Cardiologist (physician) and Advanced Practice Providers (APPs -  Physician Assistants and Nurse Practitioners) who all work together to provide you with the care you need, when you need it.  Your next appointment:   6 month(s)  The format for your next appointment:   Either In Person or Virtual  Provider:   Olga Millers, MD

## 2019-04-30 ENCOUNTER — Telehealth: Payer: Self-pay | Admitting: Physician Assistant

## 2019-04-30 DIAGNOSIS — R4586 Emotional lability: Secondary | ICD-10-CM | POA: Insufficient documentation

## 2019-04-30 DIAGNOSIS — Z818 Family history of other mental and behavioral disorders: Secondary | ICD-10-CM | POA: Insufficient documentation

## 2019-04-30 MED ORDER — LAMOTRIGINE 25 MG PO TABS: 25.0000 mg | ORAL_TABLET | Freq: Every day | ORAL | 1 refills | Status: DC

## 2019-04-30 MED ORDER — CLONAZEPAM 0.5 MG PO TABS: 0.5000 mg | ORAL_TABLET | Freq: Two times a day (BID) | ORAL | 0 refills | Status: DC | PRN

## 2019-04-30 MED FILL — clonazePAM 0.5 MG TABS: 0.5 | 15 days supply | Qty: 30 | Fill #0

## 2019-04-30 MED FILL — lamoTRIgine 25 MG TABS: 25 | 30 days supply | Qty: 30 | Fill #0

## 2019-04-30 NOTE — Telephone Encounter (Signed)
Pt is very anxious. She is picking her skin and very irritable. She got constipated with buspar. As needed clonazepam and daily lamictal.

## 2019-05-01 ENCOUNTER — Ambulatory Visit: Payer: No Typology Code available for payment source | Admitting: Cardiology

## 2019-05-16 ENCOUNTER — Encounter: Payer: Self-pay | Admitting: Family Medicine

## 2019-05-16 ENCOUNTER — Ambulatory Visit (INDEPENDENT_AMBULATORY_CARE_PROVIDER_SITE_OTHER): Payer: No Typology Code available for payment source | Admitting: Family Medicine

## 2019-05-16 VITALS — BP 94/55 | HR 58 | Ht 62.0 in | Wt 134.0 lb

## 2019-05-16 DIAGNOSIS — H5702 Anisocoria: Secondary | ICD-10-CM | POA: Diagnosis not present

## 2019-05-16 DIAGNOSIS — G43119 Migraine with aura, intractable, without status migrainosus: Secondary | ICD-10-CM | POA: Diagnosis not present

## 2019-05-16 MED ORDER — KETOROLAC TROMETHAMINE 60 MG/2ML IM SOLN
60.0000 mg | Freq: Once | INTRAMUSCULAR | Status: AC
  Administered 2019-05-16: 60 mg via INTRAMUSCULAR

## 2019-05-16 MED ORDER — PREDNISONE 10 MG PO TABS: ORAL_TABLET | ORAL | 0 refills | Status: DC

## 2019-05-16 MED FILL — traZODone HCL 50 MG TABS: 50 | 30 days supply | Qty: 30 | Fill #2

## 2019-05-16 MED FILL — predniSONE 10 MG TABS: 10 | 5 days supply | Qty: 21 | Fill #0

## 2019-05-16 NOTE — Progress Notes (Signed)
Established Patient Office Visit  Subjective:  Patient ID: Vanessa Winters, female    DOB: 07-01-1995  Age: 24 y.o. MRN: [id number]  CC:  Chief Complaint  Patient presents with  . Migraine    since sunday    HPI Vanessa Winters presents for Migraine HA x 6 days.  24 year old female with a history of migraines with aura complains of persistent migraine headache for the last 6 days.  Is primarily focused just above and lateral to her left but today it feels like it is more in the back of her head.  She has been using her rescue medication, Maxalt, daily.  She is not had any additional upper respiratory/cold symptoms. No nausea. + lightsensitivty and lightheadedness. Takes metoprolol at night.  Even try getting massage on Tuesday and that has not helped.  Past Medical History:  Diagnosis Date  . Colon polyps   . Discharge from right nipple 07/2016  . GERD (gastroesophageal reflux disease)   . Hyperlipidemia   . Migraines   . Seasonal allergies     Past Surgical History:  Procedure Laterality Date  . BREAST DUCTAL SYSTEM EXCISION Right 08/04/2016   Procedure: RIGHT NIPPLE DUCT EXCISION;  Surgeon: Manus Rudd, MD;  Location: [facility];  Service: General;  Laterality: Right;  . WISDOM TOOTH EXTRACTION      Family History  Problem Relation Age of Onset  . Hypertension Mother   . Breast cancer Mother   . Hyperlipidemia Father   . Breast cancer Maternal Grandmother   . Leukemia Paternal Grandmother   . Parkinson's disease Paternal Grandfather   . Skin cancer Other        uncle  . Heart attack Other        great uncle  . Stroke Other        great uncle    Social History   Socioeconomic History  . Marital status: Married    Spouse name: Not on file  . Number of children: Not on file  . Years of education: Not on file  . Highest education level: Not on file  Occupational History  . Not on file  Tobacco Use  . Smoking status: Current Every Day Smoker     Packs/day: 0.50    Years: 6.00    Pack years: 3.00    Types: Cigarettes, E-cigarettes  . Smokeless tobacco: Never Used  Substance and Sexual Activity  . Alcohol use: Yes    Comment: Occasional  . Drug use: Never  . Sexual activity: Yes    Partners: Male    Birth control/protection: Inserts    Comment: nuvaring  Other Topics Concern  . Not on file  Social History Narrative  . Not on file   Social Determinants of Health   Financial Resource Strain:   . Difficulty of Paying Living Expenses:   Food Insecurity:   . Worried About Programme researcher, broadcasting/film/video in the Last Year:   . Barista in the Last Year:   Transportation Needs:   . Freight forwarder (Medical):   Marland Kitchen Lack of Transportation (Non-Medical):   Physical Activity:   . Days of Exercise per Week:   . Minutes of Exercise per Session:   Stress:   . Feeling of Stress :   Social Connections:   . Frequency of Communication with Friends and Family:   . Frequency of Social Gatherings with Friends and Family:   . Attends Religious Services:   . Active Member  of Clubs or Organizations:   . Attends Archivist Meetings:   Marland Kitchen Marital Status:   Intimate Partner Violence: Not At Risk  . Fear of Current or Ex-Partner: No  . Emotionally Abused: No  . Physically Abused: No  . Sexually Abused: No    Outpatient Medications Prior to Visit  Medication Sig Dispense Refill  . atorvastatin (LIPITOR) 20 MG tablet Take 1 tablet (20 mg total) by mouth at bedtime. 90 tablet 1  . clonazePAM (KLONOPIN) 0.5 MG tablet Take 1 tablet (0.5 mg total) by mouth 2 (two) times daily as needed for anxiety. 30 tablet 0  . dicyclomine (BENTYL) 20 MG tablet Take 20 mg by mouth 3 (three) times daily as needed for spasms.    Marland Kitchen etonogestrel-ethinyl estradiol (NUVARING) 0.12-0.015 MG/24HR vaginal ring Place 1 each vaginally every 28 (twenty-eight) days. Insert vaginally and leave in place for 3 consecutive weeks, then remove for 1 week. 3 each 3   . lamoTRIgine (LAMICTAL) 25 MG tablet Take 1 tablet (25 mg total) by mouth daily. 30 tablet 1  . metoprolol succinate (TOPROL XL) 25 MG 24 hr tablet Take 1 tablet (25 mg total) by mouth daily. 90 tablet 3  . rizatriptan (MAXALT) 10 MG tablet TAKE 1 TABLET (10 MG TOTAL) BY MOUTH AS NEEDED FOR MIGRAINE. MAY REPEAT IN 2 HOURS IF NEEDED *NO MORE THAN 2 IN 24 HRS* 10 tablet 2  . sertraline (ZOLOFT) 100 MG tablet Take 2 tablets (200 mg total) by mouth daily. 180 tablet 3  . traZODone (DESYREL) 50 MG tablet Take 0.5-1 tablets (25-50 mg total) by mouth at bedtime as needed for sleep. 30 tablet 2   No facility-administered medications prior to visit.    Allergies  Allergen Reactions  . Adhesive [Tape] Other (See Comments)    STERI-STRIPS:  BLISTERS  . Buspar [Buspirone]     Constipation.     ROS Review of Systems    Objective:    Physical Exam  Constitutional: She is oriented to person, place, and time. She appears well-developed and well-nourished.  HENT:  Head: Normocephalic and atraumatic.  Right Ear: External ear normal.  Left Ear: External ear normal.  Nose: Nose normal.  TMs and canals are clear.   Eyes: Conjunctivae and EOM are normal. Right eye exhibits no discharge. Left eye exhibits no discharge.  Right pupil is slightly more dilated than her left.  Both are responsive to light.  Neck: No thyromegaly present.  Cardiovascular: Normal rate, regular rhythm and normal heart sounds.  Pulmonary/Chest: Effort normal and breath sounds normal. She has no wheezes.  Musculoskeletal:     Cervical back: Neck supple.  Lymphadenopathy:    She has no cervical adenopathy.  Neurological: She is alert and oriented to person, place, and time.  Skin: Skin is warm and dry.  Psychiatric: She has a normal mood and affect. Her behavior is normal.    BP (!) 94/55   Pulse (!) 58   Ht 5\' 2"  (1.575 m)   Wt 134 lb (60.8 kg)   LMP 05/12/2019 (Exact Date)   SpO2 100%   BMI 24.51 kg/m  Wt  Readings from Last 3 Encounters:  05/16/19 134 lb (60.8 kg)  04/17/19 134 lb (60.8 kg)  04/10/19 126 lb (57.2 kg)     There are no preventive care reminders to display for this patient.  There are no preventive care reminders to display for this patient.  Lab Results  Component Value Date   TSH  1.64 07/25/2018   Lab Results  Component Value Date   WBC 5.9 07/25/2018   HGB 13.1 07/25/2018   HCT 39.6 07/25/2018   MCV 96.1 07/25/2018   PLT 245 07/25/2018   Lab Results  Component Value Date   NA 140 07/25/2018   K 4.2 07/25/2018   CO2 27 07/25/2018   GLUCOSE 91 07/25/2018   BUN 11 07/25/2018   CREATININE 0.79 07/25/2018   BILITOT 0.4 07/25/2018   ALKPHOS 43 01/09/2018   AST 17 07/25/2018   ALT 13 07/25/2018   PROT 6.5 07/25/2018   ALBUMIN 4.3 12/05/2014   CALCIUM 9.3 07/25/2018   Lab Results  Component Value Date   CHOL 180 07/25/2018   Lab Results  Component Value Date   HDL 53 07/25/2018   Lab Results  Component Value Date   LDLCALC 112 (H) 07/25/2018   Lab Results  Component Value Date   TRIG 67 07/25/2018   Lab Results  Component Value Date   CHOLHDL 3.4 07/25/2018   No results found for: HGBA1C    Assessment & Plan:   Problem List Items Addressed This Visit      Cardiovascular and Mediastinum   Intractable migraine with aura without status migrainosus - Primary    Diagnosis migrainous x6 days.  We will treat acutely with Toradol injection she already took her Maxalt this morning.  She is not having a lot of nausea with it so held off on Phenergan.  Discussed doing a prednisone taper over the next 5 days and avoiding all over-the-counter medications and holding off on using any of her Maxalt during that timeframe.  Try to get rest, continuing to stay well-hydrated.  Blood pressure was a little low initially on repeat it was improved but she tends to have low blood pressure.  We also discussed maybe even considering adjusting her prophylaxis.  She is  already on 25 mg of metoprolol and again blood pressure is low so we do not have any wiggle room to increase her medication without causing significant hypotension.  She might be a good candidate for amitriptyline at bedtime though she is already on an SSRI.  Or consider one of the newer agents such as Aimovig etc.      Relevant Medications   predniSONE (DELTASONE) 10 MG tablet    Other Visit Diagnoses    Pupil asymmetry           Meds ordered this encounter  Medications  . predniSONE (DELTASONE) 10 MG tablet    Sig: 8 tabs po Day 1, 6 Tabs Day2, 4 tabs Day 3, 2 tabs DAy 4, 1 tab Day 5    Dispense:  21 tablet    Refill:  0  . ketorolac (TORADOL) injection 60 mg    Follow-up: No follow-ups on file.    Nani Gasser, MD

## 2019-05-16 NOTE — Assessment & Plan Note (Signed)
Diagnosis migrainous x6 days.  We will treat acutely with Toradol injection she already took her Maxalt this morning.  She is not having a lot of nausea with it so held off on Phenergan.  Discussed doing a prednisone taper over the next 5 days and avoiding all over-the-counter medications and holding off on using any of her Maxalt during that timeframe.  Try to get rest, continuing to stay well-hydrated.  Blood pressure was a little low initially on repeat it was improved but she tends to have low blood pressure.  We also discussed maybe even considering adjusting her prophylaxis.  She is already on 25 mg of metoprolol and again blood pressure is low so we do not have any wiggle room to increase her medication without causing significant hypotension.  She might be a good candidate for amitriptyline at bedtime though she is already on an SSRI.  Or consider one of the newer agents such as Aimovig etc.

## 2019-05-27 ENCOUNTER — Ambulatory Visit (INDEPENDENT_AMBULATORY_CARE_PROVIDER_SITE_OTHER): Payer: No Typology Code available for payment source | Admitting: Physician Assistant

## 2019-05-27 VITALS — BP 102/68 | HR 82 | Temp 98.2°F

## 2019-05-27 DIAGNOSIS — J029 Acute pharyngitis, unspecified: Secondary | ICD-10-CM

## 2019-05-27 LAB — POCT RAPID STREP A (OFFICE): Rapid Strep A Screen: NEGATIVE

## 2019-05-28 ENCOUNTER — Encounter: Payer: Self-pay | Admitting: Physician Assistant

## 2019-05-28 NOTE — Progress Notes (Signed)
   Subjective:    Patient ID: Vanessa Winters, female    DOB: Feb 04, 1996, 24 y.o.   MRN: [id number]  HPI  Pt is 24 yo female with ST for 2 days. [place]. No fever or chills but feels a little "flushed". She notes her right neck hurts when she swallows. Not tried anything to make better. Denies any sinus pressure or ear pain. No headache, cough, SOB. No other sick contacts. She is a smoker and smoked a lot of "real cigarettes" this weekend.   .. Active Ambulatory Problems    Diagnosis Date Noted  . Hyperlipidemia 05/22/2011  . History of migraine headaches 02/18/2013  . Irregular menses 02/18/2013  . Discharge from right nipple 05/19/2016  . Tobacco abuse 07/25/2018  . Obsessive-compulsive behavior 07/25/2018  . Anxiety 07/25/2018  . No energy 07/25/2018  . Intractable migraine with aura without status migrainosus 07/25/2018  . Trouble in sleeping 03/04/2019  . 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 Arthralgia 03/04/2019  . Hot flashes 03/04/2019  . Nicotine withdrawal 03/13/2019  . Fidgeting 04/01/2019  . Inattention 04/01/2019  . Hypoactive bowel sounds 04/10/2019  . History of colitis 04/10/2019  . Left lower quadrant pain 04/10/2019  . Constipation 04/10/2019  . Mood changes 04/30/2019  . Family history of bipolar disorder 04/30/2019   Resolved Ambulatory Problems    Diagnosis Date Noted  . Hidradenitis axillaris 11/07/2014   Past Medical History:  Diagnosis Date  . Colon polyps   . GERD (gastroesophageal reflux disease)   . Migraines   . Seasonal allergies        Review of Systems    see HPI.  Objective:   Physical Exam Vitals reviewed.  Constitutional:      Appearance: She is well-developed.  HENT:     Head: Normocephalic.     Nose: No congestion.     Mouth/Throat:     Mouth: Mucous membranes are moist. No oral lesions.     Pharynx: Uvula midline. Posterior  oropharyngeal erythema present. No pharyngeal swelling, oropharyngeal exudate or uvula swelling.  Eyes:     Conjunctiva/sclera: Conjunctivae normal.  Cardiovascular:     Rate and Rhythm: Normal rate and regular rhythm.  Musculoskeletal:     Cervical back: Normal range of motion.  Lymphadenopathy:     Cervical: No cervical adenopathy.  Neurological:     General: No focal deficit present.     Mental Status: She is alert and oriented to person, place, and time.  Psychiatric:        Mood and Affect: Mood normal.           Assessment & Plan:  Marland KitchenMarland KitchenIara was seen today for sore throat.  Diagnoses and all orders for this visit:  Sore throat -     POCT rapid strep A   .. Results for orders placed or performed in visit on 05/27/19  POCT rapid strep A  Result Value Ref Range   Rapid Strep A Screen Negative Negative   Negative strep. Likely more viral or allergic. Ibuprofen for ST. Start zyrtec at bedtime. Gargle with salt water. Follow up as needed or if symptoms worsen.

## 2019-05-29 ENCOUNTER — Other Ambulatory Visit: Payer: Self-pay | Admitting: Physician Assistant

## 2019-05-29 MED ORDER — LAMOTRIGINE 25 MG PO TABS: ORAL_TABLET | ORAL | 0 refills | Status: DC

## 2019-05-29 MED FILL — lamoTRIgine 25 MG TABS: 25 | 30 days supply | Qty: 60 | Fill #0

## 2019-05-29 NOTE — Progress Notes (Signed)
Thinks lamictal did help some with symptoms but still having ups and downs with moods. Continues to have compulsive behaviors.  BH appt next week. Increased lamictal to 50mg  daily.

## 2019-05-30 DIAGNOSIS — E237 Disorder of pituitary gland, unspecified: Secondary | ICD-10-CM

## 2019-05-31 ENCOUNTER — Other Ambulatory Visit: Payer: Self-pay | Admitting: Physician Assistant

## 2019-05-31 ENCOUNTER — Telehealth: Payer: Self-pay | Admitting: Cardiology

## 2019-05-31 DIAGNOSIS — Z0100 Encounter for examination of eyes and vision without abnormal findings: Secondary | ICD-10-CM

## 2019-05-31 MED ORDER — CLONAZEPAM 0.5 MG PO TABS: 0.5000 mg | ORAL_TABLET | Freq: Two times a day (BID) | ORAL | 0 refills | Status: DC | PRN

## 2019-05-31 MED FILL — RIZATRIPTAN BENZOATE 10 MG: 10 | 17 days supply | Qty: 10 | Fill #2

## 2019-05-31 MED FILL — clonazePAM 0.5 MG TABS: 0.5 | 15 days supply | Qty: 30 | Fill #0

## 2019-05-31 NOTE — Telephone Encounter (Signed)
Left message for patient to call and schedule appointment with an APP per Dr. Jens Som

## 2019-05-31 NOTE — Progress Notes (Signed)
Refill sent klonapin.

## 2019-05-31 NOTE — Telephone Encounter (Signed)
Filled 04/30/2019 #30 with no RF

## 2019-05-31 NOTE — Progress Notes (Signed)
Pt needs referral with [facility] for eye exam. Placed today.

## 2019-06-01 ENCOUNTER — Telehealth: Payer: Self-pay | Admitting: Cardiology

## 2019-06-01 DIAGNOSIS — R4189 Other symptoms and signs involving cognitive functions and awareness: Secondary | ICD-10-CM

## 2019-06-01 DIAGNOSIS — H539 Unspecified visual disturbance: Secondary | ICD-10-CM

## 2019-06-01 DIAGNOSIS — R29818 Other symptoms and signs involving the nervous system: Secondary | ICD-10-CM

## 2019-06-01 DIAGNOSIS — R519 Headache, unspecified: Secondary | ICD-10-CM

## 2019-06-01 DIAGNOSIS — N3942 Incontinence without sensory awareness: Secondary | ICD-10-CM

## 2019-06-01 DIAGNOSIS — F488 Other specified nonpsychotic mental disorders: Secondary | ICD-10-CM

## 2019-06-01 DIAGNOSIS — R000 Tachycardia, unspecified: Secondary | ICD-10-CM

## 2019-06-01 DIAGNOSIS — R42 Dizziness and giddiness: Secondary | ICD-10-CM

## 2019-06-01 DIAGNOSIS — R55 Syncope and collapse: Secondary | ICD-10-CM

## 2019-06-02 NOTE — Progress Notes (Signed)
Cardiology Office Note:    Date:  06/03/2019   ID:  Vanessa Winters, DOB Jul 04, 1995, MRN [id number]  PCP:  Jomarie Longs, PA-C  Cardiologist:  Norman Herrlich, MD    Referring MD: Jomarie Longs, PA-C    ASSESSMENT:    1. Hypotension, unspecified hypotension type    PLAN:    In order of problems listed above:  1. Plan over-the-counter salt tablets midodrine follow blood pressures reassess in the office.  She does not fulfill criteria for pots syndrome and I do not think drugs like ivabradine would be helpful and at this time I would not have her take a beta-blocker with hypotension.  If unimproved she may do well to go to a formal program like [facility] syncope and dysautonom[facility] clinic and have extensive evaluation.  Expectation she will improve.   Next appointment: 3 to 4 weeks   Medication Adjustments/Labs and Tests Ordered: Current medicines are reviewed at length with the patient today.  Concerns regarding medicines are outlined above.  No orders of the defined types were placed in this encounter.  Meds ordered this encounter  Medications  . midodrine (PROAMATINE) 5 MG tablet    Sig: Take 1 tablet (5 mg total) by mouth 2 (two) times daily with a meal.    Dispense:  60 tablet    Refill:  3    No chief complaint on file.   History of Present Illness:    Vanessa Winters is a 24 y.o. female with a hx of palpitation and chest pain last seen 04/17/2019 being seen today as an urgent request from working due to rapid heart rate.  Echocardiogram in 2021 was normal and a monitor performed at that time showed rare APCs PVCs and isolated couplet.  Her EKG 03/05/2019 independently reviewed sinus rhythm left atrial abnormality nonspecific ST changes.  On chart review she takes Bentyl as needed.  In June 2020 thyroid and hemoglobin were normal Compliance with diet, lifestyle and medications: Yes  She is not doing as well the central complaint is lightheadedness near syncope and documented  blood pressures in the 80s associated with this.  She stopped taking the beta-blocker.  She notices her heart rate is rapid and gets a normal physiologic heart rate distribution off of her smart watch.  She also feels weak a little bit nauseous and tremulous.  I reviewed her history with her I think her predominant problem is autonomic dysfunction with symptomatic hypotension and secondary reflex tachycardia.  I offered her medical therapy to place her on midodrine and salt tablets and reassess in the office in a few weeks.  My expectation is that many of her symptoms of weakness lightheadedness near syncope and tachycardia will improve.  She will monitor her blood pressure at work and bring them to the office with her.  I do not think the symptoms are a consequence of centrally active medications.  Note I reviewed her chart she has normal CBC and thyroid test. Past Medical History:  Diagnosis Date  . Colon polyps   . Discharge from right nipple 07/2016  . GERD (gastroesophageal reflux disease)   . Hyperlipidemia   . Migraines   . Seasonal allergies     Past Surgical History:  Procedure Laterality Date  . BREAST DUCTAL SYSTEM EXCISION Right 08/04/2016   Procedure: RIGHT NIPPLE DUCT EXCISION;  Surgeon: Manus Rudd, MD;  Location: [facility];  Service: General;  Laterality: Right;  . WISDOM TOOTH EXTRACTION  Current Medications: Current Meds  Medication Sig  . atorvastatin (LIPITOR) 20 MG tablet Take 1 tablet (20 mg total) by mouth at bedtime.  . clonazePAM (KLONOPIN) 0.5 MG tablet Take 1 tablet (0.5 mg total) by mouth 2 (two) times daily as needed for anxiety.  . dicyclomine (BENTYL) 20 MG tablet Take 20 mg by mouth 3 (three) times daily as needed for spasms.  Marland Kitchen etonogestrel-ethinyl estradiol (NUVARING) 0.12-0.015 MG/24HR vaginal ring Place 1 each vaginally every 28 (twenty-eight) days. Insert vaginally and leave in place for 3 consecutive weeks, then remove for 1 week.    . lamoTRIgine (LAMICTAL) 25 MG tablet Take 2 tablets daily.  . metoprolol succinate (TOPROL XL) 25 MG 24 hr tablet Take 1 tablet (25 mg total) by mouth daily.  . rizatriptan (MAXALT) 10 MG tablet TAKE 1 TABLET (10 MG TOTAL) BY MOUTH AS NEEDED FOR MIGRAINE. MAY REPEAT IN 2 HOURS IF NEEDED *NO MORE THAN 2 IN 24 HRS*  . sertraline (ZOLOFT) 100 MG tablet Take 2 tablets (200 mg total) by mouth daily.  . traZODone (DESYREL) 50 MG tablet Take 0.5-1 tablets (25-50 mg total) by mouth at bedtime as needed for sleep.     Allergies:   Adhesive [tape] and Buspar [buspirone]   Social History   Socioeconomic History  . Marital status: Married    Spouse name: Not on file  . Number of children: Not on file  . Years of education: Not on file  . Highest education level: Not on file  Occupational History  . Not on file  Tobacco Use  . Smoking status: Current Every Day Smoker    Packs/day: 0.50    Years: 6.00    Pack years: 3.00    Types: Cigarettes, E-cigarettes  . Smokeless tobacco: Never Used  Substance and Sexual Activity  . Alcohol use: Yes    Comment: Occasional  . Drug use: Never  . Sexual activity: Yes    Partners: Male    Birth control/protection: Inserts    Comment: nuvaring  Other Topics Concern  . Not on file  Social History Narrative  . Not on file   Social Determinants of Health   Financial Resource Strain:   . Difficulty of Paying Living Expenses:   Food Insecurity:   . Worried About Programme researcher, broadcasting/film/video in the Last Year:   . Barista in the Last Year:   Transportation Needs:   . Freight forwarder (Medical):   Marland Kitchen Lack of Transportation (Non-Medical):   Physical Activity:   . Days of Exercise per Week:   . Minutes of Exercise per Session:   Stress:   . Feeling of Stress :   Social Connections:   . Frequency of Communication with Friends and Family:   . Frequency of Social Gatherings with Friends and Family:   . Attends Religious Services:   . Active  Member of Clubs or Organizations:   . Attends Banker Meetings:   Marland Kitchen Marital Status:      Family History: The patient's family history includes Breast cancer in her maternal grandmother and mother; Heart attack in an other family member; Hyperlipidemia in her father; Hypertension in her mother; Leukemia in her paternal grandmother; Parkinson's disease in her paternal grandfather; Skin cancer in an other family member; Stroke in an other family member. ROS:   Please see the history of present illness.    All other systems reviewed and are negative.  EKGs/Labs/Other Studies Reviewed:  The following studies were reviewed today:    Recent Labs: 07/25/2018: ALT 13; BUN 11; Creat 0.79; Hemoglobin 13.1; Platelets 245; Potassium 4.2; Sodium 140; TSH 1.64  Recent Lipid Panel    Component Value Date/Time   CHOL 180 07/25/2018 0813   TRIG 67 07/25/2018 0813   HDL 53 07/25/2018 0813   CHOLHDL 3.4 07/25/2018 0813   VLDL 10 12/05/2014 0954   LDLCALC 112 (H) 07/25/2018 0813    Physical Exam:    VS:  BP 110/78   Pulse 90   Wt 141 lb (64 kg)   LMP 05/12/2019 (Exact Date)   BMI 25.79 kg/m     Wt Readings from Last 3 Encounters:  06/03/19 141 lb (64 kg)  05/16/19 134 lb (60.8 kg)  04/17/19 134 lb (60.8 kg)    Her pulse goes from 90-91 at rest to 100 203 standing. GEN: Tremulous well nourished, well developed in no acute distress HEENT: Normal NECK: No JVD; No carotid bruits LYMPHATICS: No lymphadenopathy CARDIAC: RRR, no murmurs, rubs, gallops RESPIRATORY:  Clear to auscultation without rales, wheezing or rhonchi  ABDOMEN: Soft, non-tender, non-distended MUSCULOSKELETAL:  No edema; No deformity  SKIN: Warm and dry NEUROLOGIC:  Alert and oriented x 3 PSYCHIATRIC:  Normal affect    Signed, Shirlee More, MD  06/03/2019 2:58 PM    [place]

## 2019-06-03 ENCOUNTER — Other Ambulatory Visit: Payer: Self-pay

## 2019-06-03 ENCOUNTER — Ambulatory Visit (INDEPENDENT_AMBULATORY_CARE_PROVIDER_SITE_OTHER): Payer: No Typology Code available for payment source | Admitting: Cardiology

## 2019-06-03 ENCOUNTER — Encounter: Payer: Self-pay | Admitting: Cardiology

## 2019-06-03 VITALS — BP 110/78 | HR 90 | Wt 141.0 lb

## 2019-06-03 DIAGNOSIS — R4189 Other symptoms and signs involving cognitive functions and awareness: Secondary | ICD-10-CM | POA: Insufficient documentation

## 2019-06-03 DIAGNOSIS — R519 Headache, unspecified: Secondary | ICD-10-CM | POA: Insufficient documentation

## 2019-06-03 DIAGNOSIS — I959 Hypotension, unspecified: Secondary | ICD-10-CM

## 2019-06-03 DIAGNOSIS — H539 Unspecified visual disturbance: Secondary | ICD-10-CM | POA: Insufficient documentation

## 2019-06-03 DIAGNOSIS — F488 Other specified nonpsychotic mental disorders: Secondary | ICD-10-CM | POA: Insufficient documentation

## 2019-06-03 DIAGNOSIS — R42 Dizziness and giddiness: Secondary | ICD-10-CM | POA: Insufficient documentation

## 2019-06-03 DIAGNOSIS — R000 Tachycardia, unspecified: Secondary | ICD-10-CM | POA: Insufficient documentation

## 2019-06-03 DIAGNOSIS — N3942 Incontinence without sensory awareness: Secondary | ICD-10-CM | POA: Insufficient documentation

## 2019-06-03 DIAGNOSIS — R29818 Other symptoms and signs involving the nervous system: Secondary | ICD-10-CM

## 2019-06-03 DIAGNOSIS — R55 Syncope and collapse: Secondary | ICD-10-CM | POA: Insufficient documentation

## 2019-06-03 MED ORDER — MIDODRINE HCL 5 MG PO TABS: 5.0000 mg | ORAL_TABLET | Freq: Two times a day (BID) | ORAL | 3 refills | Status: DC

## 2019-06-03 MED FILL — MIDODRINE HCL 5 MG TABS: 5 | 30 days supply | Qty: 60 | Fill #0

## 2019-06-03 NOTE — Telephone Encounter (Signed)
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 She urinated when she woke up and then notice pee coming down leg but did not know she had to urinate.   She has been checking vitals and does appear to have some variable heart race with position changes with concern for POTS or some type of dysautonomy.   With neurological symptoms needs MRI.

## 2019-06-03 NOTE — Patient Instructions (Addendum)
Medication Instructions:  Your physician has recommended you make the following change in your medication:  START: Midodrine 5mg  take one tablet by mouth twice daily.  *If you need a refill on your cardiac medications before your next appointment, please call your pharmacy*   Lab Work: None If you have labs (blood work) drawn today and your tests are completely normal, you will receive your results only by: MyChart Message (if you have MyChart) OR . A paper copy in the mail If you have any lab test that is abnormal or we need to change your treatment, we will call you to review the results.   Testing/Procedures: None   Follow-Up: At [facility], you and your health needs are our priority.  As part of our continuing mission to provide you with exceptional heart care, we have created designated Provider Care Teams.  These Care Teams include your primary Cardiologist (physician) and Advanced Practice Providers (APPs -  Physician Assistants and Nurse Practitioners) who all work together to provide you with the care you need, when you need it.  We recommend signing up for the patient portal called "MyChart".  Sign up information is provided on this After Visit Summary.  MyChart is used to connect with patients for Virtual Visits (Telemedicine).  Patients are able to view lab/test results, encounter notes, upcoming appointments, etc.  Non-urgent messages can be sent to your provider as well.   To learn more about what you can do with MyChart, go to [facility].    Your next appointment:   6 week(s)  The format for your next appointment:   In Person  Provider:   [email or web address], MD or Dr. Norman Herrlich   Other Instructions Please purchase an OTC Salt tablet and begin taking it twice daily.   Please begin drinking at least 2 liters of fluid each day.

## 2019-06-05 ENCOUNTER — Ambulatory Visit: Payer: No Typology Code available for payment source | Admitting: Cardiology

## 2019-06-10 ENCOUNTER — Other Ambulatory Visit: Payer: Self-pay

## 2019-06-10 ENCOUNTER — Ambulatory Visit (INDEPENDENT_AMBULATORY_CARE_PROVIDER_SITE_OTHER): Payer: No Typology Code available for payment source

## 2019-06-10 DIAGNOSIS — R29818 Other symptoms and signs involving the nervous system: Secondary | ICD-10-CM | POA: Diagnosis not present

## 2019-06-10 IMAGING — MR MR HEAD W/O CM
10 series · 48 of 48 positions shown · non-contrast
Comparison: No prior brain imaging. Cervical spine MRI [DATE].

CLINICAL DATA: 23-year-old female with daily headache for 1 month,
4 episodes of syncope. Lightheaded, confused, word finding
difficulty. Head trauma.

EXAM:
MRI HEAD WITHOUT CONTRAST
TECHNIQUE: Multiplanar, multiecho pulse sequences of the brain and surrounding
structures were obtained without intravenous contrast.

[Series 3: DWI · axial · 3.0mm · 1.20mm/px · z∈[-58,+89]mm · 6 of 51 slices shown (1 of 4)]
[im 1/51]
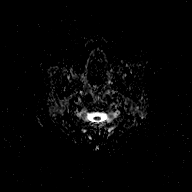
[im 11/51]
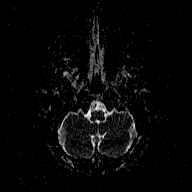
[im 21/51]
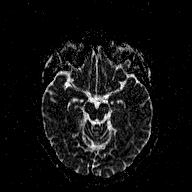
[im 31/51]
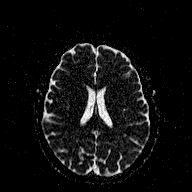
[im 41/51]
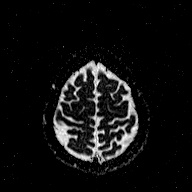
[im 51/51]
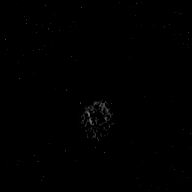

[Series 5: DWI · coronal · 3.0mm · 1.15mm/px · 4 of 45 slices shown (2 of 4)]
[im 1/45]
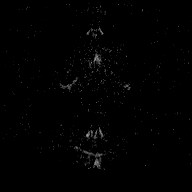
[im 15/45]
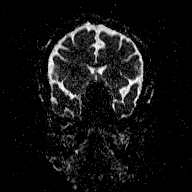
[im 30/45]
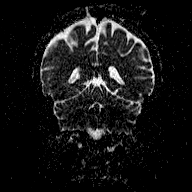
[im 45/45]
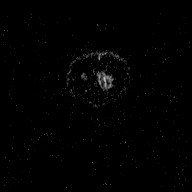

[Series 6: T1 · sagittal · 5.0mm · 0.45mm/px · 2 of 23 slices shown (1 of 2)]
[im 1/23]
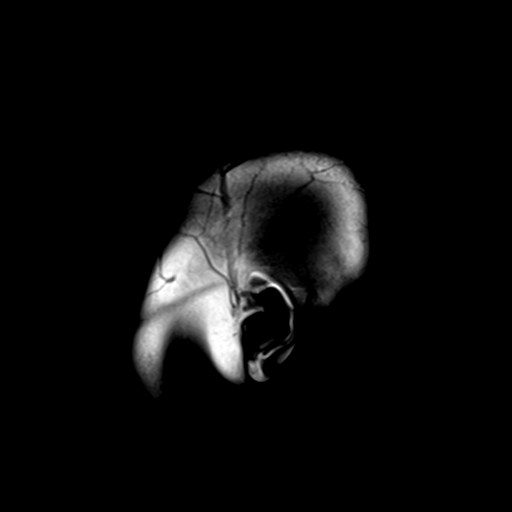
[im 23/23]
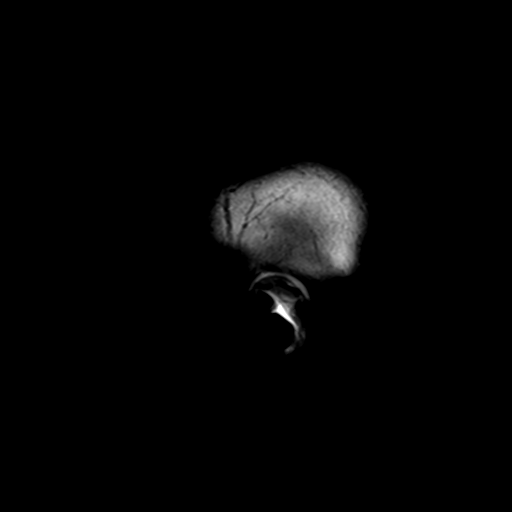

[Series 7: T2 · axial · 5.0mm · 0.72mm/px · z∈[-57,+87]mm · 2 of 22 slices shown (1 of 2)]
[im 1/22]
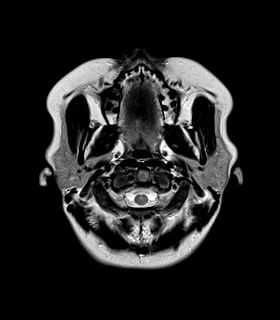
[im 22/22]
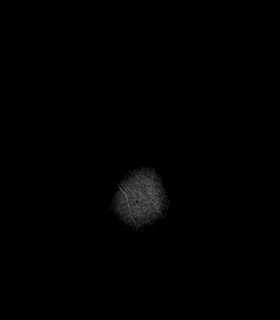

[Series 8: FLAIR · axial · 3.0mm · 0.45mm/px · z∈[-64,+95]mm · 5 of 55 slices shown]
[im 1/55]
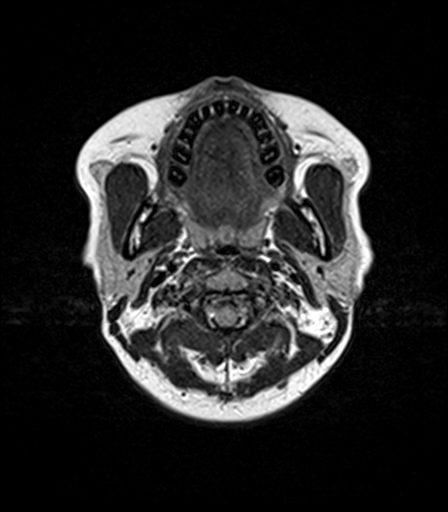
[im 14/55]
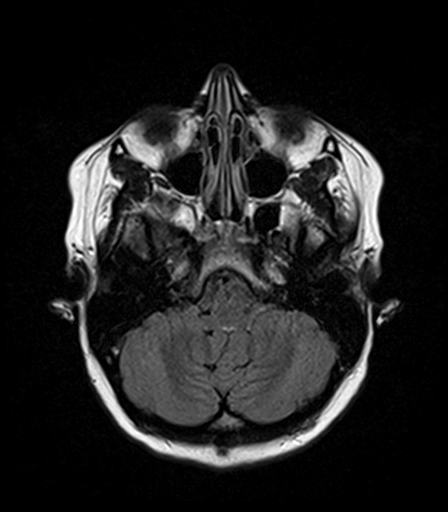
[im 28/55]
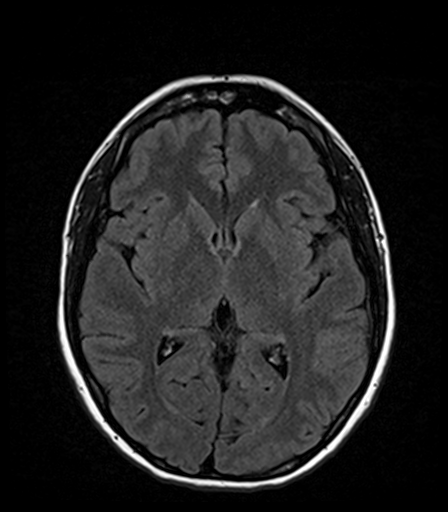
[im 41/55]
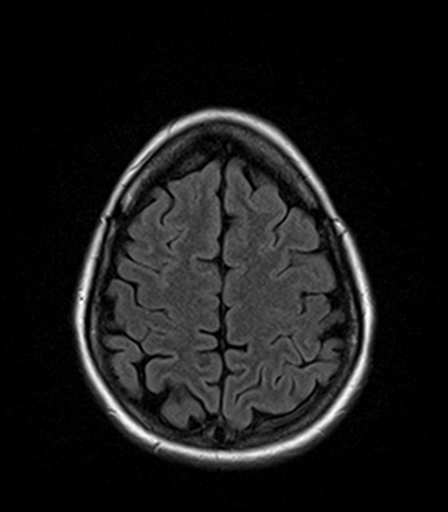
[im 55/55]
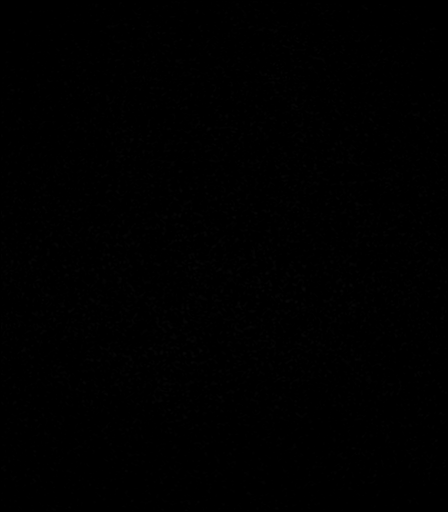

[Series 9: T2 · axial · 5.0mm · 0.72mm/px · z∈[-57,+87]mm · 2 of 22 slices shown (2 of 2)]
[im 1/22]
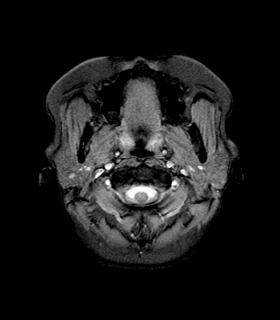
[im 22/22]
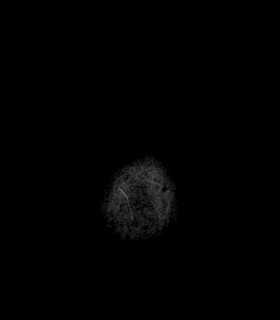

[Series 10: T1 · axial · 1.0mm · 1.00mm/px · z∈[-65,+90]mm · 15 of 160 slices shown (2 of 2)]
[im 1/160]
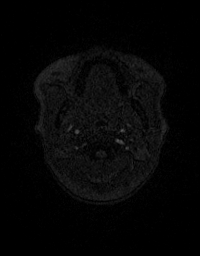
[im 12/160]
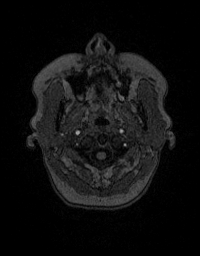
[im 23/160]
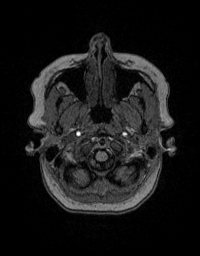
[im 35/160]
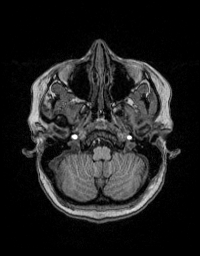
[im 46/160]
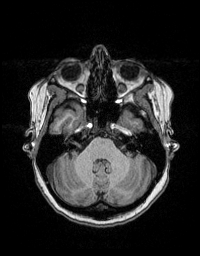
[im 57/160]
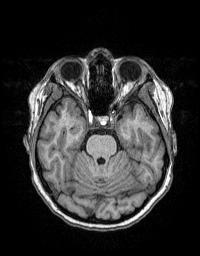
[im 69/160]
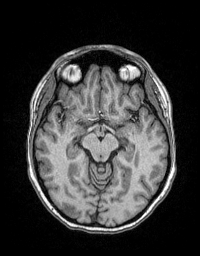
[im 80/160]
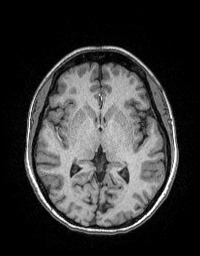
[im 91/160]
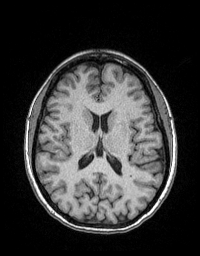
[im 103/160]
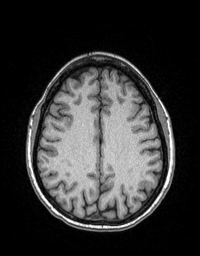
[im 114/160]
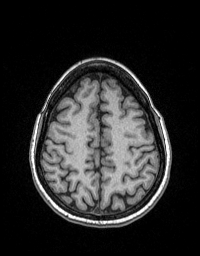
[im 125/160]
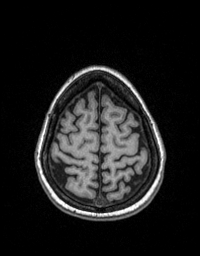
[im 137/160]
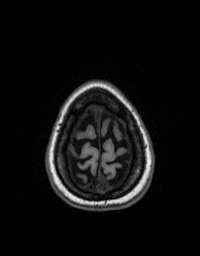
[im 148/160]
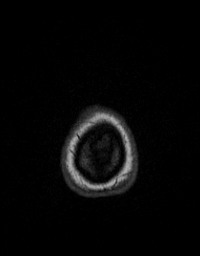
[im 160/160]
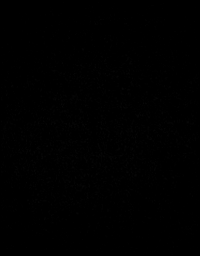

[Series 11: T2 post-contrast · coronal · 5.0mm · 0.43mm/px · 3 of 28 slices shown]
[im 1/28]
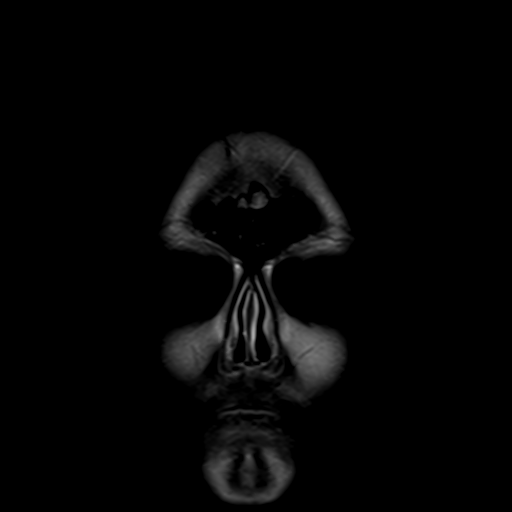
[im 14/28]
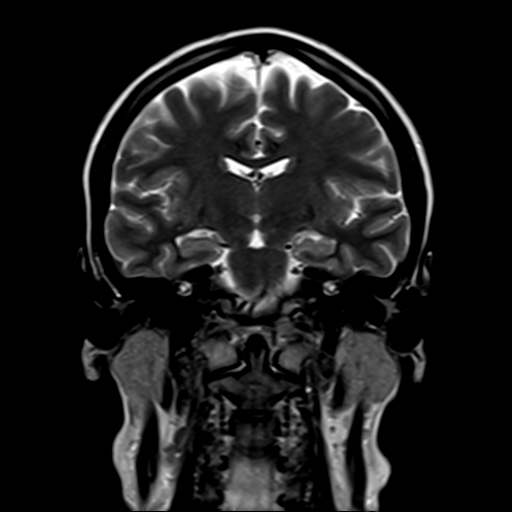
[im 28/28]
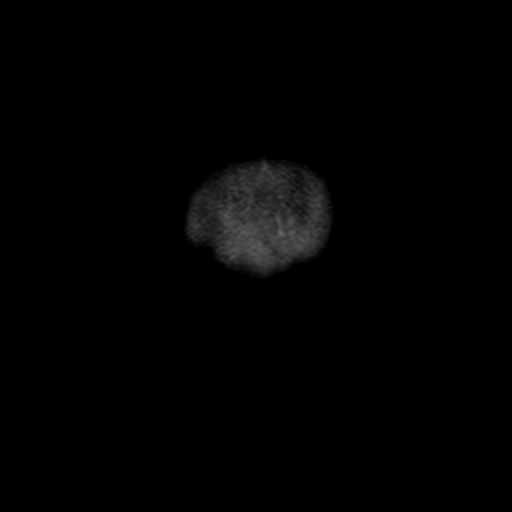

[Series 100: DWI · axial · 3.0mm · 1.20mm/px · z∈[-58,+89]mm · 5 of 51 slices shown (3 of 4)]
[im 1/51]
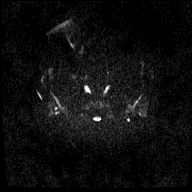
[im 13/51]
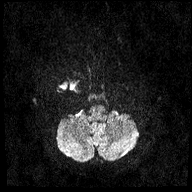
[im 26/51]
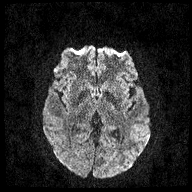
[im 38/51]
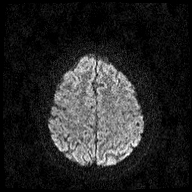
[im 51/51]
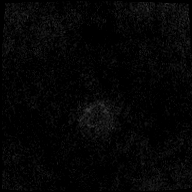

[Series 103: DWI · coronal · 3.0mm · 1.15mm/px · 4 of 45 slices shown (4 of 4)]
[im 1/45]
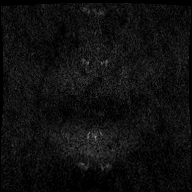
[im 15/45]
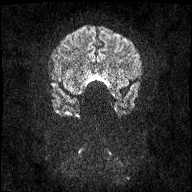
[im 30/45]
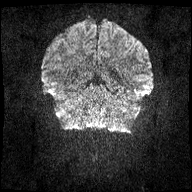
[im 45/45]
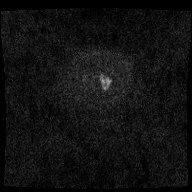

[48 of 48 positions shown; findings below may reference images not displayed]

FINDINGS: Brain: The bony sella turcica appears somewhat shallow, and the
pituitary gland is conspicuous (series 11, image 18). However,
pituitary size and morphology remains within normal limits for age
and gender (9 mm here, where normal for premenopausal females is up
to 10 mm). No suprasellar mass effect.

Normal cerebral volume, with midline structures normally formed. No
restricted diffusion to suggest acute infarction. No midline shift,
mass effect, evidence of mass lesion, ventriculomegaly, extra-axial
collection or acute intracranial hemorrhage. Cervicomedullary
junction within normal limits.

Gray and white matter signal is within normal limits throughout the
brain. No encephalomalacia or chronic cerebral blood products
identified.

Vascular: Major intracranial vascular flow voids are preserved.

Skull and upper cervical spine: Normal visible cervical spine.
Visualized bone marrow signal is within normal limits.

Sinuses/Orbits: Normal orbits. Paranasal sinuses are clear.

Other: Mastoids are clear. Visible internal auditory structures
appear normal. Normal stylomastoid foramina. Scalp and face soft
tissues appear negative.
IMPRESSION: 1. Conspicuous pituitary gland but remains within normal limits for
age and gender. Consider endocrine function tests and correlate for
history of pituitary hypersecretion. But if no endocrinopathy is
discovered then no further imaging evaluation is necessary.
2. Normal noncontrast MRI appearance of the brain.

## 2019-06-11 MED ORDER — EMGALITY 120 MG/ML ~~LOC~~ SOAJ: 1.0000 "pen " | SUBCUTANEOUS | 1 refills | Status: DC

## 2019-06-11 MED ORDER — EMGALITY 120 MG/ML ~~LOC~~ SOAJ: 2.0000 "pen " | SUBCUTANEOUS | 0 refills | Status: DC

## 2019-06-11 NOTE — Progress Notes (Unsigned)
Reviewed MRI. pituitary 67mm normal under 20mm. Prolactin and tSH recently checked and normal. Opted not to recheck hormones. HA for one month with no explanation will start emgality.

## 2019-06-13 ENCOUNTER — Telehealth (INDEPENDENT_AMBULATORY_CARE_PROVIDER_SITE_OTHER): Payer: No Typology Code available for payment source | Admitting: Psychiatry

## 2019-06-13 ENCOUNTER — Other Ambulatory Visit: Payer: Self-pay

## 2019-06-13 ENCOUNTER — Encounter: Payer: Self-pay | Admitting: Psychiatry

## 2019-06-13 DIAGNOSIS — F3181 Bipolar II disorder: Secondary | ICD-10-CM

## 2019-06-13 DIAGNOSIS — F422 Mixed obsessional thoughts and acts: Secondary | ICD-10-CM

## 2019-06-13 MED ORDER — LAMOTRIGINE 25 MG PO TABS: ORAL_TABLET | ORAL | 1 refills | Status: DC

## 2019-06-13 MED ORDER — BUPROPION HCL ER (XL) 150 MG PO TB24: 150.0000 mg | ORAL_TABLET | Freq: Every morning | ORAL | 1 refills | Status: DC

## 2019-06-13 MED FILL — buPROPion HCL ER (XL) 150 M: 150 | 30 days supply | Qty: 30 | Fill #0

## 2019-06-13 NOTE — Progress Notes (Signed)
Psychiatric Initial Adult Assessment   I connected with  Vanessa Winters on 06/13/19 by a video enabled telemedicine application and verified that I am speaking with the correct person using two identifiers.   I discussed the limitations of evaluation and management by telemedicine. The patient expressed understanding and agreed to proceed.    Patient Identification: Vanessa Winters MRN:  [id number] Date of Evaluation:  06/13/2019   Referral Source: PCP  Chief Complaint:  " My head is broken."   Visit Diagnosis:    ICD-10-CM   1. Bipolar 2 disorder (HCC)  F31.81   2. Mixed obsessional thoughts and acts  F42.2     History of Present Illness: This is a 24 year old female with past history of OCD and anxiety now seen for psychiatric evaluation after being referred by her PCP.   According to patient she has always been very habitual. She recalls constantly counting things in her head since elementary school starting age 42-9 years. The patient also recalls checking her bookbag many times in college prior to leaving for classes to be sure she has everything. She shared that when she goes to the grocery store she has to park in the same spot, walk through the store the same direction. And if she cannot then she will return home. She is constantly concerned about leaving the stove or dryer on and starting a fire. She also says she requires her husband to be certain he is perfectly clean before they interact intimately.   Patient was started on Zoloft for these symptoms about a year ago in May or June. The dose was gradually titrated to 200mg  daily. She said it was not helpful and actually caused her to have intense mood swings. Patient stopped the Zoloft 2 days ago and since has had mild withdrawal symptoms of feeling uneasy and moments of tearfulness.   Patient does have a strong family history of bipolar disorder, both sisters and mom have diagnosis. Patient endorses multiple day periods of feeling  impulsive and buying many items despite being on a tight budget. During these she also "bounces off the walls" and unable to keep a train of thought. Patient also says she is hypersexual during. Patient denies insomnia, but endorses awakening during the night and difficulty falling back asleep.   Following the hypomanic episodes, patient says she has a couple days of a "mellow" period then has a "low" that matches the high for a couple weeks. The lows consist of irritability, sadness, and tearfulness. Patient fears that this is impacting her work and others will have a bad perception of her.   Patient was asked about current medications. She is currently taking 25 mg lamictal, she was supposed to increase the dose to 50 mg but she did not. She informed she stopped sertraline 2 days ago on her own. She also takes clonzepam 0.5 mg PRN, and states she only uses at most once per week. She was also prescribed trazodone however she does not take them regularly as she did not find it to be effective for sleep.   Past Psychiatric History: OCD, anxiety  Previous Psychotropic Medications: Yes - Sertraline, Trazodone, Lamictal  Substance Abuse History in the last 12 months:  No.  Consequences of Substance Abuse: NA  Past Medical History:  Past Medical History:  Diagnosis Date  . Colon polyps   . Discharge from right nipple 07/2016  . GERD (gastroesophageal reflux disease)   . Hyperlipidemia   . Migraines   . Seasonal allergies  Past Surgical History:  Procedure Laterality Date  . BREAST DUCTAL SYSTEM EXCISION Right 08/04/2016   Procedure: RIGHT NIPPLE DUCT EXCISION;  Surgeon: Donnie Mesa, MD;  Location: [facility];  Service: General;  Laterality: Right;  . WISDOM TOOTH EXTRACTION      Family Psychiatric History: Two sisters and mom diagnosed with bipolar disorder (unspecified). One sister taking Latuda, other sister and mom untreated.   Family History:  Family History   Problem Relation Age of Onset  . Hypertension Mother   . Breast cancer Mother   . Hyperlipidemia Father   . Breast cancer Maternal Grandmother   . Leukemia Paternal Grandmother   . Parkinson's disease Paternal Grandfather   . Skin cancer Other        uncle  . Heart attack Other        great uncle  . Stroke Other        great uncle    Social History:   Social History   Socioeconomic History  . Marital status: Married    Spouse name: Not on file  . Number of children: Not on file  . Years of education: Not on file  . Highest education level: Not on file  Occupational History  . Not on file  Tobacco Use  . Smoking status: Current Every Day Smoker    Packs/day: 0.50    Years: 6.00    Pack years: 3.00    Types: Cigarettes, E-cigarettes  . Smokeless tobacco: Never Used  Substance and Sexual Activity  . Alcohol use: Yes    Comment: Occasional  . Drug use: Never  . Sexual activity: Yes    Partners: Male    Birth control/protection: Inserts    Comment: nuvaring  Other Topics Concern  . Not on file  Social History Narrative  . Not on file   Social Determinants of Health   Financial Resource Strain:   . Difficulty of Paying Living Expenses:   Food Insecurity:   . Worried About Charity fundraiser in the Last Year:   . Arboriculturist in the Last Year:   Transportation Needs:   . Film/video editor (Medical):   Marland Kitchen Lack of Transportation (Non-Medical):   Physical Activity:   . Days of Exercise per Week:   . Minutes of Exercise per Session:   Stress:   . Feeling of Stress :   Social Connections:   . Frequency of Communication with Friends and Family:   . Frequency of Social Gatherings with Friends and Family:   . Attends Religious Services:   . Active Member of Clubs or Organizations:   . 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 She lives at home with her  husband. They have been married for four years. Patient currently not planning for pregnancy, but did mention she hopes in a couple years.   Allergies:   Allergies  Allergen Reactions  . Adhesive [Tape] Other (See Comments)    STERI-STRIPS:  BLISTERS  . Buspar [Buspirone]     Constipation.     Metabolic Disorder Labs: No results found for: HGBA1C, MPG Lab Results  Component Value Date   PROLACTIN 10.4 07/18/2017   Lab Results  Component Value Date   CHOL 180 07/25/2018   TRIG 67 07/25/2018   HDL 53 07/25/2018   CHOLHDL 3.4 07/25/2018   VLDL 10 12/05/2014  LDLCALC 112 (H) 07/25/2018   LDLCALC 127 01/09/2018   Lab Results  Component Value Date   TSH 1.64 07/25/2018    Therapeutic Level Labs: No results found for: LITHIUM No results found for: CBMZ No results found for: VALPROATE  Current Medications: Current Outpatient Medications  Medication Sig Dispense Refill  . atorvastatin (LIPITOR) 20 MG tablet Take 1 tablet (20 mg total) by mouth at bedtime. 90 tablet 1  . clonazePAM (KLONOPIN) 0.5 MG tablet Take 1 tablet (0.5 mg total) by mouth 2 (two) times daily as needed for anxiety. 30 tablet 0  . dicyclomine (BENTYL) 20 MG tablet Take 20 mg by mouth 3 (three) times daily as needed for spasms.    Marland Kitchen etonogestrel-ethinyl estradiol (NUVARING) 0.12-0.015 MG/24HR vaginal ring Place 1 each vaginally every 28 (twenty-eight) days. Insert vaginally and leave in place for 3 consecutive weeks, then remove for 1 week. 3 each 3  . Galcanezumab-gnlm (EMGALITY) 120 MG/ML SOAJ Inject 2 pens into the skin every 30 (thirty) days. For loading dose. 2 pen 0  . Galcanezumab-gnlm (EMGALITY) 120 MG/ML SOAJ Inject 1 pen into the skin every 30 (thirty) days. 1 pen 1  . lamoTRIgine (LAMICTAL) 25 MG tablet Take 2 tablets daily. 60 tablet 0  . metoprolol succinate (TOPROL XL) 25 MG 24 hr tablet Take 1 tablet (25 mg total) by mouth daily. 90 tablet 3  . midodrine (PROAMATINE) 5 MG tablet Take 1 tablet  (5 mg total) by mouth 2 (two) times daily with a meal. 60 tablet 3  . rizatriptan (MAXALT) 10 MG tablet TAKE 1 TABLET (10 MG TOTAL) BY MOUTH AS NEEDED FOR MIGRAINE. MAY REPEAT IN 2 HOURS IF NEEDED *NO MORE THAN 2 IN 24 HRS* 10 tablet 2  . sertraline (ZOLOFT) 100 MG tablet Take 2 tablets (200 mg total) by mouth daily. 180 tablet 3  . traZODone (DESYREL) 50 MG tablet Take 0.5-1 tablets (25-50 mg total) by mouth at bedtime as needed for sleep. 30 tablet 2   No current facility-administered medications for this visit.     Psychiatric Specialty Exam: Review of Systems  There were no vitals taken for this visit.There is no height or weight on file to calculate BMI.  General Appearance: Well groomed, wearing clinic scrubs  Eye Contact:  Good  Speech:  Clear and Coherent and Normal Rate  Volume:  Normal  Mood:  Depressed, tearful  Affect:  Congruent and Depressed  Thought Process:  Goal Directed  Orientation:  Full (Time, Place, and Person)  Thought Content:  Negative  Suicidal Thoughts:  Denied  Homicidal Thoughts:  No  Memory:  Immediate;   Good Recent;   Good Remote;   Good  Judgement:  Fair  Insight:  Fair  Psychomotor Activity:  Normal  Concentration:  Concentration: Good  Recall:  Good  Fund of Knowledge:Good  Language: Good  Akathisia:  No  Handed:  Right  AIMS (if indicated):  Not done  Assets:  Communication Skills Desire for Improvement Financial Resources/Insurance Physical Health Resilience Social Support  ADL's:  Intact  Cognition: WNL  Sleep:  Fair   Screenings: GAD-7     Office Visit from 03/01/2019 in [facility] At [facility] Office Visit from 09/05/2018 in [facility] At [facility] Abstract from 07/19/2018 in [facility] At [facility]  Total GAD-7 Score  1  13  13     PHQ2-9     Office Visit from 03/01/2019 in [facility] Primary  Care At [facility] Office Visit from 09/05/2018 in [facility] Care At [facility] Abstract from 07/19/2018 in [facility] Primary Care At [facility] Total Score  2  2  2   PHQ-9 Total Score  7  12  10       Assessment and Plan: With patients excessive worry and anxiety related to specific events and safety, and her compulsions to relieve these, she meets diagnostic criteria for OCD. Patient also meets diagnostic criteria for Bipolar II disorder with her cycling from hypomanic states to depressive episodes. Patient previously tried Zoloft and that did not improve her symptoms of OCD. Patient also currently taking 25 mg of Lamictal.  Plan is to titrate Lamictal dose for optimal effect. Will increase  to 50mg  daily for 2 weeks then 75mg . Plan to also add wellbutyrin to help with depressive symptoms. Patient is in agreement with and understanding of plan.  Potential side effects of medication and risks vs benefits of treatment vs non-treatment were explained and discussed. All questions were answered.  1. Bipolar 2 disorder (HCC)  - Increase lamoTRIgine (LAMICTAL) 25 MG tablet; Take 2 tablets daily for 2 weeks then take 3 tablets daily  Dispense: 60 tablet; Refill: 1 - Start buPROPion (WELLBUTRIN XL) 150 MG 24 hr tablet; Take 1 tablet (150 mg total) by mouth in the morning.  Dispense: 30 tablet; Refill: 1 - Discontinue Sertraline due to lack of efficacy, pt already stopped taking it 2 days ago.  2. Mixed obsessional thoughts and acts   F/up in 1 month.    , MD 4/22/20212:27 PM

## 2019-06-14 MED FILL — EMGALITY 120 MG/ML SOAJ: 120 | 30 days supply | Qty: 2 | Fill #0

## 2019-06-17 ENCOUNTER — Telehealth: Payer: Self-pay

## 2019-06-17 ENCOUNTER — Other Ambulatory Visit: Payer: Self-pay | Admitting: Nurse Practitioner

## 2019-06-17 DIAGNOSIS — F419 Anxiety disorder, unspecified: Secondary | ICD-10-CM

## 2019-06-17 DIAGNOSIS — R112 Nausea with vomiting, unspecified: Secondary | ICD-10-CM

## 2019-06-17 DIAGNOSIS — R4681 Obsessive-compulsive behavior: Secondary | ICD-10-CM

## 2019-06-17 MED ORDER — ONDANSETRON 8 MG PO TBDP: 8.0000 mg | ORAL_TABLET | Freq: Three times a day (TID) | ORAL | 3 refills | Status: DC | PRN

## 2019-06-17 MED ORDER — SERTRALINE HCL 50 MG PO TABS: ORAL_TABLET | ORAL | 0 refills | Status: DC

## 2019-06-17 MED FILL — SERTRALINE HCL 50 MG TABLET: 50 | 28 days supply | Qty: 21 | Fill #0

## 2019-06-17 MED FILL — ONDANSETRON ODT 8 MG TABLET: 8 | 10 days supply | Qty: 30 | Fill #0

## 2019-06-17 NOTE — Telephone Encounter (Signed)
Tried calling patient in regards to her MyChart message from today. Requested that she please call us back.

## 2019-06-18 ENCOUNTER — Telehealth: Payer: Self-pay

## 2019-06-18 ENCOUNTER — Telehealth: Payer: Self-pay | Admitting: Physician Assistant

## 2019-06-18 NOTE — Telephone Encounter (Signed)
Received fax from [employer] and they approved [facility] for 1 fill from 06/13/19 - 07/12/2019. -    PA Reference: [id number] - CF

## 2019-06-18 NOTE — Telephone Encounter (Signed)
Spoke to the patient just now and she let me know in regards to Dr. Hulen Shouts recommendations. She informed me that she has already been taking midodrine 5 mg twice daily and she has a follow up visit on 08/02/19. I am going to route this back to Dr. Dulce Sellar since the patient is already taking the midodrine that he recommended to start.    Encouraged patient to call back with any questions or concerns.

## 2019-06-18 NOTE — Telephone Encounter (Signed)
I spoke with Dr. Dulce Sellar and discussed with him the patients situation. He states that he thinks it would be best for the patient to be seen in the office at this time. I called the patient back and let her know this information. She states that she wants to discuss this with her husband and will call us back if she wishes to schedule an appointment.    Encouraged patient to call back with any questions or concerns.

## 2019-06-24 MED FILL — LAMOTRIGINE 25 MG TABS: 25 | 25 days supply | Qty: 60 | Fill #0

## 2019-06-27 ENCOUNTER — Encounter: Payer: Self-pay | Admitting: Physician Assistant

## 2019-07-08 ENCOUNTER — Encounter: Payer: Self-pay | Admitting: Physician Assistant

## 2019-07-08 NOTE — Progress Notes (Signed)
Ok find out if she wants to come to me or go to gyn!

## 2019-07-08 NOTE — Progress Notes (Signed)
Vanessa Winters has declined a PAP at this time. She will do a PAP with you when she is ready.

## 2019-07-11 ENCOUNTER — Other Ambulatory Visit: Payer: Self-pay

## 2019-07-11 ENCOUNTER — Other Ambulatory Visit: Payer: Self-pay | Admitting: Physician Assistant

## 2019-07-11 ENCOUNTER — Telehealth (INDEPENDENT_AMBULATORY_CARE_PROVIDER_SITE_OTHER): Payer: No Typology Code available for payment source | Admitting: Psychiatry

## 2019-07-11 ENCOUNTER — Encounter: Payer: Self-pay | Admitting: Psychiatry

## 2019-07-11 DIAGNOSIS — F3181 Bipolar II disorder: Secondary | ICD-10-CM | POA: Diagnosis not present

## 2019-07-11 DIAGNOSIS — G43119 Migraine with aura, intractable, without status migrainosus: Secondary | ICD-10-CM

## 2019-07-11 DIAGNOSIS — F422 Mixed obsessional thoughts and acts: Secondary | ICD-10-CM

## 2019-07-11 LAB — TSH: TSH: 0.79 mIU/L

## 2019-07-11 LAB — ACTH: C206 ACTH: 6 pg/mL (ref 6–50)

## 2019-07-11 LAB — PROLACTIN: Prolactin: 7.5 ng/mL

## 2019-07-11 LAB — CORTISOL: Cortisol, Plasma: 4.8 ug/dL

## 2019-07-11 LAB — FSH/LH
FSH: 2.6 m[IU]/mL
LH: 5.2 m[IU]/mL

## 2019-07-11 MED ORDER — BUPROPION HCL ER (XL) 150 MG PO TB24: 150.0000 mg | ORAL_TABLET | Freq: Every morning | ORAL | 2 refills | Status: DC

## 2019-07-11 MED ORDER — BUPROPION HCL ER (XL) 150 MG PO TB24: 150.0000 mg | ORAL_TABLET | Freq: Every morning | ORAL | 1 refills | Status: DC

## 2019-07-11 MED ORDER — LAMOTRIGINE 100 MG PO TABS: 100.0000 mg | ORAL_TABLET | Freq: Every day | ORAL | 1 refills | Status: DC

## 2019-07-11 MED ORDER — LAMOTRIGINE 100 MG PO TABS: 100.0000 mg | ORAL_TABLET | Freq: Every day | ORAL | 2 refills | Status: DC

## 2019-07-11 MED FILL — LAMOTRIGINE 100 MG TABS: 100 | 30 days supply | Qty: 30 | Fill #0

## 2019-07-11 MED FILL — buPROPion HCL ER (XL) 150 M: 150 | 30 days supply | Qty: 30 | Fill #0

## 2019-07-11 MED FILL — RIZATRIPTAN BENZOATE 10 MG: 10 | 17 days supply | Qty: 10 | Fill #0

## 2019-07-11 NOTE — Addendum Note (Signed)
Addended by: Zena Amos on: 07/11/2019 10:06 AM   Modules accepted: Orders

## 2019-07-11 NOTE — Progress Notes (Addendum)
BH MD/PA/NP OP Progress Note  Virtual Visit via Video Note  I connected with Vanessa Winters on 07/11/19 at  9:00 AM EDT by a video enabled telemedicine application and verified that I am speaking with the correct person using two identifiers.  Location: Patient: Home Provider: Clinic   I discussed the limitations of evaluation and management by telemedicine and the availability of in person appointments. The patient expressed understanding and agreed to proceed.  I provided 17 minutes of non-face-to-face time during this encounter.     07/11/2019 9:34 AM Vanessa Winters  MRN:  [id number]  Chief Complaint:  "I feel really good"  HPI: This is a 24 year old female with past history of OCD and anxiety seen today for psychiatric follow up evaluation. She reports that she is doing well on her current medication regimen. She notes she surprised that medications are working so well and reports that she has seen an improvement in her ability to focus and prioritize. She reports at times she has up and downs with her mood. Provider recommend increasing Lamictal 75 mg daily to 100 mg daily to stabilize mood. Patient is agreeable to adjustments.   Patient informed provider that she will be tested for ADHD. She reports that she does not believe she has ADHD however notes notes that he inattentiveness was due to her having bipolar 2. She ask provider if she should cancel her testing. Provider encouraged patient to follow through with appointment. She endorsed understanding and agreed. No other concerns noted at this time.    Visit Diagnosis:    ICD-10-CM   1. Bipolar 2 disorder (HCC)  F31.81 buPROPion (WELLBUTRIN XL) 150 MG 24 hr tablet    lamoTRIgine (LAMICTAL) 100 MG tablet  2. Mixed obsessional thoughts and acts  F42.2     Past Psychiatric History:  OCD and anxiety  Past Medical History:  Past Medical History:  Diagnosis Date  . Colon polyps   . Discharge from right nipple 07/2016  . GERD  (gastroesophageal reflux disease)   . Hyperlipidemia   . Migraines   . Seasonal allergies     Past Surgical History:  Procedure Laterality Date  . BREAST DUCTAL SYSTEM EXCISION Right 08/04/2016   Procedure: RIGHT NIPPLE DUCT EXCISION;  Surgeon: Manus Rudd, MD;  Location: [facility];  Service: General;  Laterality: Right;  . WISDOM TOOTH EXTRACTION      Family Psychiatric History:  Two sisters and mom diagnosed with bipolar disorder (unspecified). One sister taking Latuda, other sister and mom untreated.    Family History:  Family History  Problem Relation Age of Onset  . Hypertension Mother   . Breast cancer Mother   . Hyperlipidemia Father   . Breast cancer Maternal Grandmother   . Leukemia Paternal Grandmother   . Parkinson's disease Paternal Grandfather   . Skin cancer Other        uncle  . Heart attack Other        great uncle  . Stroke Other        great uncle    Social History:  Social History   Socioeconomic History  . Marital status: Married    Spouse name: Not on file  . Number of children: Not on file  . Years of education: Not on file  . Highest education level: Not on file  Occupational History  . Not on file  Tobacco Use  . Smoking status: Current Every Day Smoker    Packs/day: 0.50    Years:  6.00    Pack years: 3.00    Types: Cigarettes, E-cigarettes  . Smokeless tobacco: Never Used  Substance and Sexual Activity  . Alcohol use: Yes    Comment: Occasional  . Drug use: Never  . Sexual activity: Yes    Partners: Male    Birth control/protection: Inserts    Comment: nuvaring  Other Topics Concern  . Not on file  Social History Narrative  . Not on file   Social Determinants of Health   Financial Resource Strain:   . Difficulty of Paying Living Expenses:   Food Insecurity:   . Worried About Charity fundraiser in the Last Year:   . Arboriculturist in the Last Year:   Transportation Needs:   . Film/video editor  (Medical):   Marland Kitchen Lack of Transportation (Non-Medical):   Physical Activity:   . Days of Exercise per Week:   . Minutes of Exercise per Session:   Stress:   . Feeling of Stress :   Social Connections:   . Frequency of Communication with Friends and Family:   . Frequency of Social Gatherings with Friends and Family:   . Attends Religious Services:   . Active Member of Clubs or Organizations:   . Attends Archivist Meetings:   Marland Kitchen Marital Status:     Allergies:  Allergies  Allergen Reactions  . Adhesive [Tape] Other (See Comments)    STERI-STRIPS:  BLISTERS  . Buspar [Buspirone]     Constipation.     Metabolic Disorder Labs: No results found for: HGBA1C, MPG Lab Results  Component Value Date   PROLACTIN 7.5 07/09/2019   PROLACTIN 10.4 07/18/2017   Lab Results  Component Value Date   CHOL 180 07/25/2018   TRIG 67 07/25/2018   HDL 53 07/25/2018   CHOLHDL 3.4 07/25/2018   VLDL 10 12/05/2014   LDLCALC 112 (H) 07/25/2018   LDLCALC 127 01/09/2018   Lab Results  Component Value Date   TSH 0.79 07/09/2019   TSH 1.64 07/25/2018    Therapeutic Level Labs: No results found for: LITHIUM No results found for: VALPROATE No components found for:  CBMZ  Current Medications: Current Outpatient Medications  Medication Sig Dispense Refill  . atorvastatin (LIPITOR) 20 MG tablet Take 1 tablet (20 mg total) by mouth at bedtime. 90 tablet 1  . buPROPion (WELLBUTRIN XL) 150 MG 24 hr tablet Take 1 tablet (150 mg total) by mouth in the morning. 30 tablet 1  . clonazePAM (KLONOPIN) 0.5 MG tablet Take 1 tablet (0.5 mg total) by mouth 2 (two) times daily as needed for anxiety. 30 tablet 0  . dicyclomine (BENTYL) 20 MG tablet Take 20 mg by mouth 3 (three) times daily as needed for spasms.    Marland Kitchen etonogestrel-ethinyl estradiol (NUVARING) 0.12-0.015 MG/24HR vaginal ring Place 1 each vaginally every 28 (twenty-eight) days. Insert vaginally and leave in place for 3 consecutive weeks, then  remove for 1 week. 3 each 3  . Galcanezumab-gnlm (EMGALITY) 120 MG/ML SOAJ Inject 2 pens into the skin every 30 (thirty) days. For loading dose. 2 pen 0  . Galcanezumab-gnlm (EMGALITY) 120 MG/ML SOAJ Inject 1 pen into the skin every 30 (thirty) days. 1 pen 1  . lamoTRIgine (LAMICTAL) 100 MG tablet Take 1 tablet (100 mg total) by mouth daily. 30 tablet 1  . midodrine (PROAMATINE) 5 MG tablet Take 1 tablet (5 mg total) by mouth 2 (two) times daily with a meal. 60 tablet 3  .  ondansetron (ZOFRAN-ODT) 8 MG disintegrating tablet Take 1 tablet (8 mg total) by mouth every 8 (eight) hours as needed for nausea. 30 tablet 3  . rizatriptan (MAXALT) 10 MG tablet TAKE 1 TABLET (10 MG TOTAL) BY MOUTH AS NEEDED FOR MIGRAINE. MAY REPEAT IN 2 HOURS IF NEEDED *NO MORE THAN 2 IN 24 HRS* 10 tablet 2  . traZODone (DESYREL) 50 MG tablet Take 0.5-1 tablets (25-50 mg total) by mouth at bedtime as needed for sleep. 30 tablet 2   No current facility-administered medications for this visit.     Musculoskeletal: Strength & Muscle Tone: Not assessed. Visit via telehealth.  Gait & Station: Not assessed. Visit via telehealth.  Patient leans: Not assessed. Visit via telehealth.   Psychiatric Specialty Exam: Review of Systems  There were no vitals taken for this visit.There is no height or weight on file to calculate BMI.  General Appearance: Well Groomed  Eye Contact:  Good  Speech:  Normal Rate  Volume:  Normal  Mood:  Euthymic  Affect:  Congruent  Thought Process:  Coherent, Goal Directed and Linear  Orientation:  Full (Time, Place, and Person)  Thought Content: WDL and Logical   Suicidal Thoughts:  No  Homicidal Thoughts:  No  Memory:  Immediate;   Good Recent;   Good Remote;   Good  Judgement:  Good  Insight:  Good  Psychomotor Activity:  Normal  Concentration:  Concentration: Good and Attention Span: Good  Recall:  Good  Fund of Knowledge: Good  Language: Good  Akathisia:  No  Handed:  Right  AIMS  (if indicated): not done visit telehealth  Assets:  Communication Skills Desire for Improvement Financial Resources/Insurance Housing  ADL's:  Intact  Cognition: WNL  Sleep:  Good   Screenings: GAD-7     Office Visit from 03/01/2019 in [facility] At [facility] Office Visit from 09/05/2018 in [facility] At [facility] Abstract from 07/19/2018 in [facility]  Total GAD-7 Score  1  13  13     PHQ2-9     Office Visit from 03/01/2019 in [facility] At [facility] Office Visit from 09/05/2018 in [facility] At [facility] Abstract from 07/19/2018 in [facility] Care At [facility]  PHQ-2 Total Score  2  2  2   PHQ-9 Total Score  7  12  10        Assessment and Plan: Patient reports that she is doing well on current medication regimen. She notes at times she has up and doing mood swings. She is agreeable to continue increase Lamictal 75 mg daily to 100 mg PO daily.    1. Bipolar 2 disorder (HCC)  - buPROPion (WELLBUTRIN XL) 150 MG 24 hr tablet; Take 1 tablet (150 mg total) by mouth in the morning.  Dispense: 30 tablet; Refill: 1 - Increase lamoTRIgine (LAMICTAL) 100 MG tablet; Take 1 tablet (100 mg total) by mouth daily.  Dispense: 30 tablet; Refill: 1  2. Mixed obsessional thoughts and acts   Follow up in 2 months     [facility], NP 07/11/2019, 9:34 AM    I saw and managed the pt with NP B. .  Shanna Cisco, MD 07/11/2019 10:05 AM

## 2019-07-12 ENCOUNTER — Telehealth: Payer: No Typology Code available for payment source | Admitting: Psychiatry

## 2019-07-12 MED FILL — EMGALITY 120 MG/ML SOAJ: 120 | 30 days supply | Qty: 1 | Fill #0

## 2019-07-15 ENCOUNTER — Other Ambulatory Visit: Payer: Self-pay | Admitting: Physician Assistant

## 2019-07-15 DIAGNOSIS — R7989 Other specified abnormal findings of blood chemistry: Secondary | ICD-10-CM

## 2019-07-15 DIAGNOSIS — E2740 Unspecified adrenocortical insufficiency: Secondary | ICD-10-CM

## 2019-07-25 ENCOUNTER — Ambulatory Visit (INDEPENDENT_AMBULATORY_CARE_PROVIDER_SITE_OTHER): Payer: No Typology Code available for payment source | Admitting: Nurse Practitioner

## 2019-07-25 DIAGNOSIS — L237 Allergic contact dermatitis due to plants, except food: Secondary | ICD-10-CM | POA: Diagnosis not present

## 2019-07-25 MED ORDER — METHYLPREDNISOLONE SODIUM SUCC 125 MG IJ SOLR
125.0000 mg | Freq: Once | INTRAMUSCULAR | Status: AC
  Administered 2019-07-25: 125 mg via INTRAMUSCULAR

## 2019-07-25 NOTE — Progress Notes (Signed)
Solu-Medrol 125mg  given LUOQ without immediate complications.  Agree with above plan.  , DNP, AGNP-c

## 2019-07-29 ENCOUNTER — Other Ambulatory Visit: Payer: Self-pay | Admitting: Physician Assistant

## 2019-07-29 DIAGNOSIS — L237 Allergic contact dermatitis due to plants, except food: Secondary | ICD-10-CM

## 2019-07-29 MED ORDER — PREDNISONE 20 MG PO TABS: ORAL_TABLET | ORAL | 0 refills | Status: DC

## 2019-07-29 MED ORDER — TRIAMCINOLONE ACETONIDE 0.1 % EX CREA: 1.0000 "application " | TOPICAL_CREAM | Freq: Two times a day (BID) | CUTANEOUS | 0 refills | Status: DC

## 2019-07-29 NOTE — Progress Notes (Signed)
Poison ivy dermatitis. Solumedrol shot helped but still itchy and spreading. Sent prednisone burst and triamcinolone.

## 2019-08-01 NOTE — Progress Notes (Signed)
Cardiology Office Note:    Date:  08/02/2019   ID:  Vanessa Winters, DOB 05/14/1995, MRN [id number]  PCP:  Donella Stade, PA-C  Cardiologist:  Shirlee More, MD    Referring MD: Donella Stade, PA-C    ASSESSMENT:    1. Hypotension, unspecified hypotension type   2. Tachycardia    PLAN:    In order of problems listed above:  1. She has had a marked functional improvement treating her hypotension with an alpha agonist and I think that her palpitation and tachycardia was reflex sinus tachycardia.  Although she has subtle abnormalities on endocrine testing I doubt she has adrenal insufficiency I think is valuable to see an endocrinologist I told her she may need further testing such as ACTH stimulation test.  I told her she can increase her midodrine to 3 times a day asked her to add over-the-counter coated tablets of Nicholaus Bloom salt or Gatorade to her diet and I will plan to see back in the future as needed   Next appointment: As needed   Medication Adjustments/Labs and Tests Ordered: Current medicines are reviewed at length with the patient today.  Concerns regarding medicines are outlined above.  No orders of the defined types were placed in this encounter.  Meds ordered this encounter  Medications   midodrine (PROAMATINE) 5 MG tablet    Sig: Take 1 tablet (5 mg total) by mouth 3 (three) times daily with meals.    Dispense:  270 tablet    Refill:  3    Chief Complaint  Patient presents with   Follow-up    History of Present Illness:    Vanessa Winters is a 24 y.o. female with a hx of symptomatic hypotension last seen 06/03/2019 and started on midodrine. Compliance with diet, lifestyle and medications: Yes  She is quite improved since starting midodrine still gets systolic blood pressures less than 90-100 but no longer has the episodes of reflex tachycardia.  Unfortunately she purchased noncoated salt tablets she could not tolerate them I told her to either use  over-the-counter Gatorade or coated salt tablets that she can find online.  She is not having hypotension with fainting is much more functional feels improved last week and has some subtle abnormalities on testing of cortisol and ACTH and brain MRI and is pending endocrine evaluation.   Ref Range & Units 3 wk ago  Cortisol, Plasma mcg/dL 4.8     Ref Range & Units 3 wk ago  C206 ACTH 6 - 50 pg/mL 6    Brain MRI: IMPRESSION: 1. Conspicuous pituitary gland but remains within normal limits for age and gender. Consider endocrine function tests and correlate for history of pituitary hypersecretion. But if no endocrinopathy is discovered then no further imaging evaluation is necessary. 2. Normal noncontrast MRI appearance of the brain.  Past Medical History:  Diagnosis Date   Colon polyps    Discharge from right nipple 07/2016   GERD (gastroesophageal reflux disease)    Hyperlipidemia    Migraines    Seasonal allergies     Past Surgical History:  Procedure Laterality Date   BREAST DUCTAL SYSTEM EXCISION Right 08/04/2016   Procedure: RIGHT NIPPLE DUCT EXCISION;  Surgeon: Donnie Mesa, MD;  Location: [facility];  Service: General;  Laterality: Right;   WISDOM TOOTH EXTRACTION      Current Medications: Current Meds  Medication Sig   atorvastatin (LIPITOR) 20 MG tablet Take 1 tablet (20 mg total) by mouth at  bedtime.   buPROPion (WELLBUTRIN XL) 150 MG 24 hr tablet Take 1 tablet (150 mg total) by mouth in the morning.   clonazePAM (KLONOPIN) 0.5 MG tablet Take 1 tablet (0.5 mg total) by mouth 2 (two) times daily as needed for anxiety.   dicyclomine (BENTYL) 20 MG tablet Take 20 mg by mouth 3 (three) times daily as needed for spasms.   etonogestrel-ethinyl estradiol (NUVARING) 0.12-0.015 MG/24HR vaginal ring Place 1 each vaginally every 28 (twenty-eight) days. Insert vaginally and leave in place for 3 consecutive weeks, then remove for 1 week.    Galcanezumab-gnlm (EMGALITY) 120 MG/ML SOAJ Inject 1 pen into the skin every 30 (thirty) days.   lamoTRIgine (LAMICTAL) 100 MG tablet Take 1 tablet (100 mg total) by mouth daily.   ondansetron (ZOFRAN-ODT) 8 MG disintegrating tablet Take 1 tablet (8 mg total) by mouth every 8 (eight) hours as needed for nausea.   rizatriptan (MAXALT) 10 MG tablet TAKE 1 TABLET (10 MG TOTAL) BY MOUTH AS NEEDED FOR MIGRAINE. MAY REPEAT IN 2 HOURS IF NEEDED *NO MORE THAN 2 IN 24 HRS*   traZODone (DESYREL) 50 MG tablet Take 0.5-1 tablets (25-50 mg total) by mouth at bedtime as needed for sleep.   [DISCONTINUED] midodrine (PROAMATINE) 5 MG tablet Take 1 tablet (5 mg total) by mouth 2 (two) times daily with a meal.     Allergies:   Adhesive [tape] and Buspar [buspirone]   Social History   Socioeconomic History   Marital status: Married    Spouse name: Not on file   Number of children: Not on file   Years of education: Not on file   Highest education level: Not on file  Occupational History   Not on file  Tobacco Use   Smoking status: Current Every Day Smoker    Packs/day: 0.50    Years: 6.00    Pack years: 3.00    Types: Cigarettes, E-cigarettes   Smokeless tobacco: Never Used  Building services engineer Use: Never used  Substance and Sexual Activity   Alcohol use: Yes    Comment: Occasional   Drug use: Never   Sexual activity: Yes    Partners: Male    Birth control/protection: Inserts    Comment: nuvaring  Other Topics Concern   Not on file  Social History Narrative   Not on file   Social Determinants of Health   Financial Resource Strain:    Difficulty of Paying Living Expenses:   Food Insecurity:    Worried About Programme researcher, broadcasting/film/video in the Last Year:    Barista in the Last Year:   Transportation Needs:    Freight forwarder (Medical):    Lack of Transportation (Non-Medical):   Physical Activity:    Days of Exercise per Week:    Minutes of Exercise per  Session:   Stress:    Feeling of Stress :   Social Connections:    Frequency of Communication with Friends and Family:    Frequency of Social Gatherings with Friends and Family:    Attends Religious Services:    Active Member of Clubs or Organizations:    Attends Engineer, structural:    Marital Status:      Family History: The patient's family history includes Breast cancer in her maternal grandmother and mother; Heart attack in an other family member; Hyperlipidemia in her father; Hypertension in her mother; Leukemia in her paternal grandmother; Parkinson's disease in her paternal grandfather; Skin  cancer in an other family member; Stroke in an other family member. ROS:   Please see the history of present illness.    All other systems reviewed and are negative.  EKGs/Labs/Other Studies Reviewed:    The following studies were reviewed today:   Recent Labs: 07/09/2019: TSH 0.79  Recent Lipid Panel    Component Value Date/Time   CHOL 180 07/25/2018 0813   TRIG 67 07/25/2018 0813   HDL 53 07/25/2018 0813   CHOLHDL 3.4 07/25/2018 0813   VLDL 10 12/05/2014 0954   LDLCALC 112 (H) 07/25/2018 0813    Physical Exam:    VS:  BP (!) 108/58    Pulse 86    Ht 5\' 2"  (1.575 m)    Wt 143 lb (64.9 kg)    SpO2 98%    BMI 26.16 kg/m     Wt Readings from Last 3 Encounters:  08/02/19 143 lb (64.9 kg)  06/03/19 141 lb (64 kg)  05/16/19 134 lb (60.8 kg)     GEN: She appears much healthier well nourished, well developed in no acute distress HEENT: Normal NECK: No JVD; No carotid bruits LYMPHATICS: No lymphadenopathy CARDIAC: RRR, no murmurs, rubs, gallops RESPIRATORY:  Clear to auscultation without rales, wheezing or rhonchi  ABDOMEN: Soft, non-tender, non-distended MUSCULOSKELETAL:  No edema; No deformity  SKIN: Warm and dry NEUROLOGIC:  Alert and oriented x 3 PSYCHIATRIC:  Normal affect    Signed, 05/18/19, MD  08/02/2019 4:18 PM    [facility]

## 2019-08-02 ENCOUNTER — Other Ambulatory Visit: Payer: Self-pay

## 2019-08-02 ENCOUNTER — Ambulatory Visit (INDEPENDENT_AMBULATORY_CARE_PROVIDER_SITE_OTHER): Payer: No Typology Code available for payment source | Admitting: Cardiology

## 2019-08-02 ENCOUNTER — Encounter: Payer: Self-pay | Admitting: Cardiology

## 2019-08-02 VITALS — BP 108/58 | HR 86 | Ht 62.0 in | Wt 143.0 lb

## 2019-08-02 DIAGNOSIS — R000 Tachycardia, unspecified: Secondary | ICD-10-CM | POA: Diagnosis not present

## 2019-08-02 DIAGNOSIS — I959 Hypotension, unspecified: Secondary | ICD-10-CM | POA: Diagnosis not present

## 2019-08-02 MED ORDER — MIDODRINE HCL 5 MG PO TABS: 5.0000 mg | ORAL_TABLET | Freq: Three times a day (TID) | ORAL | 3 refills | Status: DC

## 2019-08-02 NOTE — Patient Instructions (Signed)
Medication Instructions:  Your physician has recommended you make the following change in your medication:  STOP: Midodrine 5 mg take one tablet by mouth three times daily.   Please buy an OTC salt tablet and take 1-2 daily *If you need a refill on your cardiac medications before your next appointment, please call your pharmacy*   Lab Work: None If you have labs (blood work) drawn today and your tests are completely normal, you will receive your results only by: Marland Kitchen MyChart Message (if you have MyChart) OR . A paper copy in the mail If you have any lab test that is abnormal or we need to change your treatment, we will call you to review the results.   Testing/Procedures: None   Follow-Up: At [facility], you and your health needs are our priority.  As part of our continuing mission to provide you with exceptional heart care, we have created designated Provider Care Teams.  These Care Teams include your primary Cardiologist (physician) and Advanced Practice Providers (APPs -  Physician Assistants and Nurse Practitioners) who all work together to provide you with the care you need, when you need it.  We recommend signing up for the patient portal called "MyChart".  Sign up information is provided on this After Visit Summary.  MyChart is used to connect with patients for Virtual Visits (Telemedicine).  Patients are able to view lab/test results, encounter notes, upcoming appointments, etc.  Non-urgent messages can be sent to your provider as well.   To learn more about what you can do with MyChart, go to [email or web address].    Your next appointment:   As needed  The format for your next appointment:   In Person  Provider:   Norman Herrlich, MD   Other Instructions

## 2019-08-05 ENCOUNTER — Ambulatory Visit (INDEPENDENT_AMBULATORY_CARE_PROVIDER_SITE_OTHER): Payer: No Typology Code available for payment source | Admitting: Sports Medicine

## 2019-08-05 DIAGNOSIS — M25561 Pain in right knee: Secondary | ICD-10-CM | POA: Diagnosis not present

## 2019-08-05 MED ORDER — TRAMADOL HCL 50 MG PO TABS: 50.0000 mg | ORAL_TABLET | Freq: Three times a day (TID) | ORAL | 0 refills | Status: DC | PRN

## 2019-08-05 MED FILL — traMADol HCL 50 MG TABS: 50 | 4 days supply | Qty: 21 | Fill #0

## 2019-08-05 NOTE — Assessment & Plan Note (Addendum)
This is a relatively pleasant 24 year old female, for the last few weeks she has been doing some more exercise, stairstepper but unfortunately noted pain on the lateral aspect of her knee just proximal to the joint line. Occasional popping and mechanical symptoms, her knee is stable from a ligamentous standpoint, no discrete areas of tenderness with the exception of at the distal biceps femoris and the iliotibial band. She has a negative McMurray's sign, I think this is more of IT band syndrome. Tramadol for pain. Return to see me in a month after doing the rehab exercises.

## 2019-08-05 NOTE — Progress Notes (Signed)
    Procedures performed today:    None.  Independent interpretation of notes and tests performed by another provider:   None.  Brief History, Exam, Impression, and Recommendations:    Right knee pain This is a relatively pleasant 24 year old female, for the last few weeks she has been doing some more exercise, stairstepper but unfortunately noted pain on the lateral aspect of her knee just proximal to the joint line. Occasional popping and mechanical symptoms, her knee is stable from a ligamentous standpoint, no discrete areas of tenderness with the exception of at the distal biceps femoris and the iliotibial band. She has a negative McMurray's sign, I think this is more of IT band syndrome. Tramadol for pain. Return to see me in a month after doing the rehab exercises.    ___________________________________________ Ihor Austin. Benjamin Stain, M.D., ABFM., CAQSM. Primary Care and Sports Medicine [facility]  Adjunct Instructor of Family Medicine  [employer]

## 2019-08-06 MED FILL — buPROPion HCL ER (XL) 150 M: 150 | 30 days supply | Qty: 30 | Fill #0

## 2019-08-06 MED FILL — LAMOTRIGINE 100 MG TABS: 100 | 30 days supply | Qty: 30 | Fill #0

## 2019-08-07 ENCOUNTER — Ambulatory Visit: Payer: No Typology Code available for payment source | Admitting: Internal Medicine

## 2019-08-14 ENCOUNTER — Ambulatory Visit (INDEPENDENT_AMBULATORY_CARE_PROVIDER_SITE_OTHER): Payer: No Typology Code available for payment source | Admitting: Internal Medicine

## 2019-08-14 ENCOUNTER — Other Ambulatory Visit: Payer: Self-pay

## 2019-08-14 ENCOUNTER — Encounter: Payer: Self-pay | Admitting: Internal Medicine

## 2019-08-14 VITALS — BP 106/68 | HR 90 | Temp 97.9°F | Resp 16 | Ht 61.0 in | Wt 141.0 lb

## 2019-08-14 DIAGNOSIS — I959 Hypotension, unspecified: Secondary | ICD-10-CM

## 2019-08-14 NOTE — Patient Instructions (Signed)
-   We will set you up for adrenal testing in 3 months because of the recent solumedrol injection.  - This will be done at the [facility] practice located on [address] , [address]

## 2019-08-14 NOTE — Progress Notes (Signed)
Lft vm for pt to return call to be placed on nurse visit schedule.

## 2019-08-14 NOTE — Progress Notes (Signed)
Name: Vanessa Winters  MRN/ DOB: [id number], 1995-07-21    Age/ Sex: 24 y.o., female    PCP: Jomarie Longs, PA-C   Reason for Endocrinology Evaluation: Low cortisol      Date of Initial Endocrinology Evaluation: 08/14/2019     HPI: Ms. Vanessa Winters is a 24 y.o. female with a past medical history of Migraine headaches, dyslipidemia and hypotension. The patient presented for initial endocrinology clinic visit on 08/14/2019 for consultative assistance with her Low cortisol     In 2018 she had nipple discharge , prolactin was normal at the time. She had a right breast fibroadenoma removed. She continues with right nipple discharge.    Pt has been seen by cardiology for symptomatic hypotension, and reflex tachycardia . She has been treated with midodrine and salt tablets. During evaluation she had a normal -low ACTH level at 6 pg/mL and normal cortisol at 4.8 mcg/dL , of noted these labs were drawn at 1500.  The rest of her pituitary labs have been normal to include FSH, LH and prolactin levels.   She also received prednisone in 04/2019 Pt received solumedrol 125 mg on 07/25/2019 due to Ivy dermatitis.     Today she has nausea but she is not sure if this is related to salt tablets.  She stays hydrated  Weight has been increasing  Tans easily.  No palms pigmentation.    Sister with thyroid issues.   HISTORY:  Past Medical History:  Past Medical History:  Diagnosis Date   Colon polyps    Discharge from right nipple 07/2016   GERD (gastroesophageal reflux disease)    Hyperlipidemia    Migraines    Seasonal allergies    Past Surgical History:  Past Surgical History:  Procedure Laterality Date   BREAST DUCTAL SYSTEM EXCISION Right 08/04/2016   Procedure: RIGHT NIPPLE DUCT EXCISION;  Surgeon: Manus Rudd, MD;  Location: [facility];  Service: General;  Laterality: Right;   WISDOM TOOTH EXTRACTION        Social History:  reports that she has been  smoking e-cigarettes. She has a 3.00 pack-year smoking history. She has never used smokeless tobacco. She reports current alcohol use. She reports that she does not use drugs.  Family History: family history includes Breast cancer in her maternal grandmother and mother; Heart attack in an other family member; Hyperlipidemia in her father; Hypertension in her mother; Leukemia in her paternal grandmother; Parkinson's disease in her paternal grandfather; Skin cancer in an other family member; Stroke in an other family member.   HOME MEDICATIONS: Allergies as of 08/14/2019      Reactions   Adhesive [tape] Other (See Comments)   STERI-STRIPS:  BLISTERS   Buspar [buspirone]    Constipation.       Medication List       Accurate as of August 14, 2019  8:05 AM. If you have any questions, ask your nurse or doctor.        STOP taking these medications   dicyclomine 20 MG tablet Commonly known as: BENTYL Stopped by: Scarlette Shorts, MD   etonogestrel-ethinyl estradiol 0.12-0.015 MG/24HR vaginal ring Commonly known as: NUVARING Stopped by: Scarlette Shorts, MD     TAKE these medications   atorvastatin 20 MG tablet Commonly known as: LIPITOR Take 1 tablet (20 mg total) by mouth at bedtime.   buPROPion 150 MG 24 hr tablet Commonly known as: Wellbutrin XL Take 1 tablet (150 mg total) by mouth  in the morning.   clonazePAM 0.5 MG tablet Commonly known as: KLONOPIN Take 1 tablet (0.5 mg total) by mouth 2 (two) times daily as needed for anxiety.   Emgality 120 MG/ML Soaj Generic drug: Galcanezumab-gnlm Inject 1 pen into the skin every 30 (thirty) days.   lamoTRIgine 100 MG tablet Commonly known as: LaMICtal Take 1 tablet (100 mg total) by mouth daily.   midodrine 5 MG tablet Commonly known as: PROAMATINE Take 1 tablet (5 mg total) by mouth 3 (three) times daily with meals.   ondansetron 8 MG disintegrating tablet Commonly known as: ZOFRAN-ODT Take 1 tablet (8 mg total) by  mouth every 8 (eight) hours as needed for nausea.   rizatriptan 10 MG tablet Commonly known as: MAXALT TAKE 1 TABLET (10 MG TOTAL) BY MOUTH AS NEEDED FOR MIGRAINE. MAY REPEAT IN 2 HOURS IF NEEDED *NO MORE THAN 2 IN 24 HRS*   traMADol 50 MG tablet Commonly known as: ULTRAM Take 1-2 tablets (50-100 mg total) by mouth every 8 (eight) hours as needed for moderate pain. Maximum 6 tabs per day.   traZODone 50 MG tablet Commonly known as: DESYREL Take 0.5-1 tablets (25-50 mg total) by mouth at bedtime as needed for sleep.         REVIEW OF SYSTEMS: A comprehensive ROS was conducted with the patient and is negative except as per HPI    OBJECTIVE:  VS: BP 106/68 (BP Location: Left Arm, Patient Position: Sitting, Cuff Size: Small)    Pulse 90    Temp 97.9 F (36.6 C) (Temporal)    Resp 16    Ht 5\' 1"  (1.549 m)    Wt 141 lb (64 kg)    SpO2 100%    BMI 26.64 kg/m    Wt Readings from Last 3 Encounters:  08/14/19 141 lb (64 kg)  08/02/19 143 lb (64.9 kg)  06/03/19 141 lb (64 kg)     EXAM: General: Pt appears well and is in NAD  Eyes: External eye exam normal without stare, lid lag or exophthalmos.  EOM intact.  Neck: General: Supple without adenopathy. Thyroid: Thyroid size normal.  No goiter or nodules appreciated. No thyroid bruit.  Lungs: Clear with good BS bilat with no rales, rhonchi, or wheezes  Heart: Auscultation: RRR.  Abdomen: Normoactive bowel sounds, soft, nontender, without masses or organomegaly palpable  Extremities:  BL LE: No pretibial edema normal ROM and strength.  Skin: Hair: Texture and amount normal with gender appropriate distribution Skin Inspection: No rashes, acanthosis nigricans/skin tags. No lipohypertrophy Skin Palpation: Skin temperature, texture, and thickness normal to palpation  Neuro: Cranial nerves: II - XII grossly intact  Cerebellar: Normal coordination and movement; no tremor Motor: Normal strength throughout DTRs: 2+ and symmetric in UE  without delay in relaxation phase  Mental Status: Judgment, insight: Intact Orientation: Oriented to time, place, and person Memory: Intact for recent and remote events Mood and affect: No depression, anxiety, or agitation     DATA REVIEWED:  Results for CHRISHAWNA, FARINA (MRN [id number]) as of 08/14/2019 07:07  Ref. Range 07/09/2019 15:52  Cortisol, Plasma Latest Units: mcg/dL 4.8  LH Latest Units: mIU/mL 5.2  FSH Latest Units: mIU/mL 2.6  Prolactin Latest Units: ng/mL 7.5  TSH Latest Units: mIU/L 0.79  C206 ACTH Latest Ref Range: 6 - 50 pg/mL 6   MRI brain 06/10/2019   1. Conspicuous pituitary gland but remains within normal limits for age and gender. Consider endocrine function tests and correlate for history of  pituitary hypersecretion. But if no endocrinopathy is discovered then no further imaging evaluation is necessary. 2. Normal noncontrast MRI appearance of the brain.   ASSESSMENT/PLAN/RECOMMENDATIONS:   1. Hypotension:  - Low suspicion for adrenal insufficieny, her normal low ACTH and cortisol levels are due to recent intake of oral prednisone ( 04/2019) as well as mid day checks.   - I have offered to proceed with cosyntropin stimulation test , but this will have to be postponed for another 10 weeks she she recently received 125 mg of Solu-medrol for contact dermatitis which will affect hypothalamic- pituitary -adrenal axis.    2. Galactorrhea:  - This is chronic. Work up negative so far. She denies antipsychotic medications in 2018 or when this started.  - Repeat prolactin normal, we did discuss options for treatment if this is bothersome but after weighting the risk and benefits , we opted to hold off on any treatment.  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    F/U PRN - pending cosyntropin stim test    Signed electronically by: Lyndle Herrlich, MD  [facility] End[facility]  [facility] [address] [place], [place] [address] Phone: [phone] FAX: [phone]   CC: [facility] [address] [place] [address] Phone: [phone] Fax: [phone]   Return to Endocrinology clinic as below: Future Appointments  Date Time Provider Department Center  08/16/2019  9:00 AM Altabet, Salvatore Decent, PhD [facility] None  09/03/2019  8:00 AM Altabet, Salvatore Decent, PhD [facility] None  09/16/2019  8:30 AM Zena Amos, MD [facility] None

## 2019-08-15 NOTE — Progress Notes (Signed)
Scheduled 11/18/2019 @ 8

## 2019-08-16 ENCOUNTER — Ambulatory Visit (INDEPENDENT_AMBULATORY_CARE_PROVIDER_SITE_OTHER): Payer: No Typology Code available for payment source | Admitting: Psychology

## 2019-08-16 DIAGNOSIS — F419 Anxiety disorder, unspecified: Secondary | ICD-10-CM | POA: Diagnosis not present

## 2019-08-16 DIAGNOSIS — F319 Bipolar disorder, unspecified: Secondary | ICD-10-CM

## 2019-08-16 DIAGNOSIS — F422 Mixed obsessional thoughts and acts: Secondary | ICD-10-CM | POA: Diagnosis not present

## 2019-08-20 MED FILL — EMGALITY 120 MG/ML SOAJ: 120 | 30 days supply | Qty: 1 | Fill #1

## 2019-08-21 ENCOUNTER — Encounter: Payer: Self-pay | Admitting: Physician Assistant

## 2019-08-21 ENCOUNTER — Ambulatory Visit: Payer: No Typology Code available for payment source | Admitting: Endocrinology

## 2019-08-21 ENCOUNTER — Other Ambulatory Visit (HOSPITAL_COMMUNITY)
Admission: RE | Admit: 2019-08-21 | Discharge: 2019-08-21 | Disposition: A | Discharge status: Home / Self Care | Payer: No Typology Code available for payment source | Source: Ambulatory Visit | Attending: Physician Assistant | Admitting: Physician Assistant

## 2019-08-21 ENCOUNTER — Ambulatory Visit (INDEPENDENT_AMBULATORY_CARE_PROVIDER_SITE_OTHER): Payer: No Typology Code available for payment source | Admitting: Physician Assistant

## 2019-08-21 ENCOUNTER — Other Ambulatory Visit: Payer: Self-pay

## 2019-08-21 VITALS — BP 108/61 | HR 98 | Ht 61.0 in | Wt 143.0 lb

## 2019-08-21 DIAGNOSIS — B373 Candidiasis of vulva and vagina: Secondary | ICD-10-CM

## 2019-08-21 DIAGNOSIS — Z01419 Encounter for gynecological examination (general) (routine) without abnormal findings: Secondary | ICD-10-CM | POA: Diagnosis not present

## 2019-08-21 DIAGNOSIS — Z79899 Other long term (current) drug therapy: Secondary | ICD-10-CM

## 2019-08-21 DIAGNOSIS — E538 Deficiency of other specified B group vitamins: Secondary | ICD-10-CM

## 2019-08-21 DIAGNOSIS — E7801 Familial hypercholesterolemia: Secondary | ICD-10-CM

## 2019-08-21 DIAGNOSIS — B3731 Acute candidiasis of vulva and vagina: Secondary | ICD-10-CM

## 2019-08-21 DIAGNOSIS — Z0000 Encounter for general adult medical examination without abnormal findings: Secondary | ICD-10-CM | POA: Diagnosis not present

## 2019-08-21 NOTE — Progress Notes (Signed)
Subjective:     Cesar Alf is a 24 y.o. female and is here for a comprehensive physical exam. The patient reports no problems.  She is off OCP and wanting to get pregnant.    Social History   Socioeconomic History  . Marital status: Married    Spouse name: Not on file  . Number of children: Not on file  . Years of education: Not on file  . Highest education level: Not on file  Occupational History  . Not on file  Tobacco Use  . Smoking status: Current Every Day Smoker    Packs/day: 0.50    Years: 6.00    Pack years: 3.00    Types: E-cigarettes  . Smokeless tobacco: Never Used  Vaping Use  . Vaping Use: Never used  Substance and Sexual Activity  . Alcohol use: Yes    Comment: Occasional  . Drug use: Never  . Sexual activity: Yes    Partners: Male    Birth control/protection: None    Comment: nuvaring  Other Topics Concern  . Not on file  Social History Narrative  . Not on file   Social Determinants of Health   Financial Resource Strain:   . Difficulty of Paying Living Expenses:   Food Insecurity:   . Worried About Programme researcher, broadcasting/film/video in the Last Year:   . Barista in the Last Year:   Transportation Needs:   . Freight forwarder (Medical):   Marland Kitchen Lack of Transportation (Non-Medical):   Physical Activity:   . Days of Exercise per Week:   . Minutes of Exercise per Session:   Stress:   . Feeling of Stress :   Social Connections:   . Frequency of Communication with Friends and Family:   . Frequency of Social Gatherings with Friends and Family:   . Attends Religious Services:   . Active Member of Clubs or Organizations:   . Attends Banker Meetings:   Marland Kitchen Marital Status:   Intimate Partner Violence:   . Fear of Current or Ex-Partner:   . Emotionally Abused:   Marland Kitchen Physically Abused:   . Sexually Abused:    Health Maintenance  Topic Date Due  . INFLUENZA VACCINE  09/22/2019  . TETANUS/TDAP  04/20/2021  . PAP-Cervical Cytology Screening   08/21/2022  . PAP SMEAR-Modifier  08/21/2022  . COVID-19 Vaccine  Completed  . Hepatitis C Screening  Completed  . HIV Screening  Completed    The following portions of the patient's history were reviewed and updated as appropriate: allergies, current medications, past family history, past medical history, past social history, past surgical history and problem list.  Review of Systems A comprehensive review of systems was negative.   Objective:    BP 108/61   Pulse 98   Ht 5\' 1"  (1.549 m)   Wt 143 lb (64.9 kg)   SpO2 100%   BMI 27.02 kg/m  General appearance: alert, cooperative and appears stated age Head: Normocephalic, without obvious abnormality, atraumatic Eyes: conjunctivae/corneas clear. PERRL, EOM's intact. Fundi benign. Ears: normal TM's and external ear canals both ears Nose: Nares normal. Septum midline. Mucosa normal. No drainage or sinus tenderness. Throat: lips, mucosa, and tongue normal; teeth and gums normal Neck: no adenopathy, no carotid bruit, no JVD, supple, symmetrical, trachea midline and thyroid not enlarged, symmetric, no tenderness/mass/nodules Back: symmetric, no curvature. ROM normal. No CVA tenderness. Lungs: clear to auscultation bilaterally Breasts: normal appearance, no masses or tenderness Heart: regular  rate and rhythm, S1, S2 normal, no murmur, click, rub or gallop Abdomen: soft, non-tender; bowel sounds normal; no masses,  no organomegaly Pelvic: cervix normal in appearance, external genitalia normal, no adnexal masses or tenderness, no cervical motion tenderness, uterus normal size, shape, and consistency and vagina normal without discharge Extremities: extremities normal, atraumatic, no cyanosis or edema Pulses: 2+ and symmetric Skin: Skin color, texture, turgor normal. No rashes or lesions Lymph nodes: Cervical, supraclavicular, and axillary nodes normal. Neurologic: Alert and oriented X 3, normal strength and tone. Normal symmetric  reflexes. Normal coordination and gait   .Marland Kitchen Depression screen [facility] 2/9 08/21/2019 03/01/2019 09/05/2018 07/19/2018  Decreased Interest 1 1 1 1   Down, Depressed, Hopeless 1 1 1 1   PHQ - 2 Score 2 2 2 2   Altered sleeping 1 3 1  0  Tired, decreased energy 0 1 2 3   Change in appetite 0 1 2 1   Feeling bad or failure about yourself  0 0 2 2  Trouble concentrating 1 0 2 2  Moving slowly or fidgety/restless 0 0 1 0  Suicidal thoughts 0 0 0 0  PHQ-9 Score 4 7 12 10   Difficult doing work/chores Somewhat difficult Somewhat difficult Somewhat difficult Somewhat difficult   . GAD 7 : Generalized Anxiety Score 08/21/2019 03/01/2019 09/05/2018 07/19/2018  Nervous, Anxious, on Edge 1 1 2 2   Control/stop worrying 1 0 2 3  Worry too much - different things 1 0 2 3  Trouble relaxing 0 0 2 0  Restless 0 0 1 0  Easily annoyed or irritable 1 0 2 2  Afraid - awful might happen 0 0 2 3  Total GAD 7 Score 4 1 13 13   Anxiety Difficulty Somewhat difficult Somewhat difficult Somewhat difficult Somewhat difficult     Assessment:    Healthy female exam.      Plan:     Debbie was seen today for annual exam.  Diagnoses and all orders for this visit:  Routine physical examination -     Cytology - PAP -     Lipid Panel w/reflex Direct LDL -     COMPLETE METABOLIC PANEL WITH GFR -     08/23/2019 and Folate Panel  Encounter for routine gynecological examination with Papanicolaou smear of cervix -     Cytology - PAP  Familial hypercholesteremia -     Lipid Panel w/reflex Direct LDL  Medication management -     COMPLETE METABOLIC PANEL WITH GFR  Low serum vitamin B12 -     B12 and Folate Panel  Vaginal yeast infection -     fluconazole (DIFLUCAN) 150 MG tablet; Take 1 tablet (150 mg total) by mouth once for 1 dose.  .. Discussed 150 minutes of exercise a week.  Encouraged vitamin D 1000 units and Calcium 1300mg  or 4 servings of dairy a day.  Fasting labs ordered.  Pap done today. Declined  STI.   Discussed pregnancy preparation.  Start prenatal with folic acid and DHA.  Stop lipitor now.  I would be quick to take pregnancy test while trying. Multiple medications that need to be stopped when pregnant.  -emgality/maxalt -wellbutrin -klonapin -midodrine See After Visit Summary for Counseling Recommendations

## 2019-08-21 NOTE — Patient Instructions (Signed)
First Trimester of Pregnancy The first trimester of pregnancy is from week 1 until the end of week 13 (months 1 through 3). A week after a sperm fertilizes an egg, the egg will implant on the wall of the uterus. This embryo will begin to develop into a baby. Genes from you and your partner will form the baby. The female genes will determine whether the baby will be a boy or a girl. At 6-8 weeks, the eyes and face will be formed, and the heartbeat can be seen on ultrasound. At the end of 12 weeks, all the baby's organs will be formed. Now that you are pregnant, you will want to do everything you can to have a healthy baby. Two of the most important things are to get good prenatal care and to follow your health care provider's instructions. Prenatal care is all the medical care you receive before the baby's birth. This care will help prevent, find, and treat any problems during the pregnancy and childbirth. Body changes during your first trimester Your body goes through many changes during pregnancy. The changes vary from woman to woman.  You may gain or lose a couple of pounds at first.  You may feel sick to your stomach (nauseous) and you may throw up (vomit). If the vomiting is uncontrollable, call your health care provider.  You may tire easily.  You may develop headaches that can be relieved by medicines. All medicines should be approved by your health care provider.  You may urinate more often. Painful urination may mean you have a bladder infection.  You may develop heartburn as a result of your pregnancy.  You may develop constipation because certain hormones are causing the muscles that push stool through your intestines to slow down.  You may develop hemorrhoids or swollen veins (varicose veins).  Your breasts may begin to grow larger and become tender. Your nipples may stick out more, and the tissue that surrounds them (areola) may become darker.  Your gums may bleed and may be  sensitive to brushing and flossing.  Dark spots or blotches (chloasma, mask of pregnancy) may develop on your face. This will likely fade after the baby is born.  Your menstrual periods will stop.  You may have a loss of appetite.  You may develop cravings for certain kinds of food.  You may have changes in your emotions from day to day, such as being excited to be pregnant or being concerned that something may go wrong with the pregnancy and baby.  You may have more vivid and strange dreams.  You may have changes in your hair. These can include thickening of your hair, rapid growth, and changes in texture. Some women also have hair loss during or after pregnancy, or hair that feels dry or thin. Your hair will most likely return to normal after your baby is born. What to expect at prenatal visits During a routine prenatal visit:  You will be weighed to make sure you and the baby are growing normally.  Your blood pressure will be taken.  Your abdomen will be measured to track your baby's growth.  The fetal heartbeat will be listened to between weeks 10 and 14 of your pregnancy.  Test results from any previous visits will be discussed. Your health care provider may ask you:  How you are feeling.  If you are feeling the baby move.  If you have had any abnormal symptoms, such as leaking fluid, bleeding, severe headaches, or abdominal   cramping.  If you are using any tobacco products, including cigarettes, chewing tobacco, and electronic cigarettes.  If you have any questions. Other tests that may be performed during your first trimester include:  Blood tests to find your blood type and to check for the presence of any previous infections. The tests will also be used to check for low iron levels (anemia) and protein on red blood cells (Rh antibodies). Depending on your risk factors, or if you previously had diabetes during pregnancy, you may have tests to check for high blood sugar  that affects pregnant women (gestational diabetes).  Urine tests to check for infections, diabetes, or protein in the urine.  An ultrasound to confirm the proper growth and development of the baby.  Fetal screens for spinal cord problems (spina bifida) and Down syndrome.  HIV (human immunodeficiency virus) testing. Routine prenatal testing includes screening for HIV, unless you choose not to have this test.  You may need other tests to make sure you and the baby are doing well. Follow these instructions at home: Medicines  Follow your health care provider's instructions regarding medicine use. Specific medicines may be either safe or unsafe to take during pregnancy.  Take a prenatal vitamin that contains at least 600 micrograms (mcg) of folic acid.  If you develop constipation, try taking a stool softener if your health care provider approves. Eating and drinking   Eat a balanced diet that includes fresh fruits and vegetables, whole grains, good sources of protein such as meat, eggs, or tofu, and low-fat dairy. Your health care provider will help you determine the amount of weight gain that is right for you.  Avoid raw meat and uncooked cheese. These carry germs that can cause birth defects in the baby.  Eating four or five small meals rather than three large meals a day may help relieve nausea and vomiting. If you start to feel nauseous, eating a few soda crackers can be helpful. Drinking liquids between meals, instead of during meals, also seems to help ease nausea and vomiting.  Limit foods that are high in fat and processed sugars, such as fried and sweet foods.  To prevent constipation: ? Eat foods that are high in fiber, such as fresh fruits and vegetables, whole grains, and beans. ? Drink enough fluid to keep your urine clear or pale yellow. Activity  Exercise only as directed by your health care provider. Most women can continue their usual exercise routine during  pregnancy. Try to exercise for 30 minutes at least 5 days a week. Exercising will help you: ? Control your weight. ? Stay in shape. ? Be prepared for labor and delivery.  Experiencing pain or cramping in the lower abdomen or lower back is a good sign that you should stop exercising. Check with your health care provider before continuing with normal exercises.  Try to avoid standing for long periods of time. Move your legs often if you must stand in one place for a long time.  Avoid heavy lifting.  Wear low-heeled shoes and practice good posture.  You may continue to have sex unless your health care provider tells you not to. Relieving pain and discomfort  Wear a good support bra to relieve breast tenderness.  Take warm sitz baths to soothe any pain or discomfort caused by hemorrhoids. Use hemorrhoid cream if your health care provider approves.  Rest with your legs elevated if you have leg cramps or low back pain.  If you develop varicose veins in   your legs, wear support hose. Elevate your feet for 15 minutes, 3-4 times a day. Limit salt in your diet. Prenatal care  Schedule your prenatal visits by the twelfth week of pregnancy. They are usually scheduled monthly at first, then more often in the last 2 months before delivery.  Write down your questions. Take them to your prenatal visits.  Keep all your prenatal visits as told by your health care provider. This is important. Safety  Wear your seat belt at all times when driving.  Make a list of emergency phone numbers, including numbers for family, friends, the hospital, and police and fire departments. General instructions  Ask your health care provider for a referral to a local prenatal education class. Begin classes no later than the beginning of month 6 of your pregnancy.  Ask for help if you have counseling or nutritional needs during pregnancy. Your health care provider can offer advice or refer you to specialists for help  with various needs.  Do not use hot tubs, steam rooms, or saunas.  Do not douche or use tampons or scented sanitary pads.  Do not cross your legs for long periods of time.  Avoid cat litter boxes and soil used by cats. These carry germs that can cause birth defects in the baby and possibly loss of the fetus by miscarriage or stillbirth.  Avoid all smoking, herbs, alcohol, and medicines not prescribed by your health care provider. Chemicals in these products affect the formation and growth of the baby.  Do not use any products that contain nicotine or tobacco, such as cigarettes and e-cigarettes. If you need help quitting, ask your health care provider. You may receive counseling support and other resources to help you quit.  Schedule a dentist appointment. At home, brush your teeth with a soft toothbrush and be gentle when you floss. Contact a health care provider if:  You have dizziness.  You have mild pelvic cramps, pelvic pressure, or nagging pain in the abdominal area.  You have persistent nausea, vomiting, or diarrhea.  You have a bad smelling vaginal discharge.  You have pain when you urinate.  You notice increased swelling in your face, hands, legs, or ankles.  You are exposed to fifth disease or chickenpox.  You are exposed to German measles (rubella) and have never had it. Get help right away if:  You have a fever.  You are leaking fluid from your vagina.  You have spotting or bleeding from your vagina.  You have severe abdominal cramping or pain.  You have rapid weight gain or loss.  You vomit blood or material that looks like coffee grounds.  You develop a severe headache.  You have shortness of breath.  You have any kind of trauma, such as from a fall or a car accident. Summary  The first trimester of pregnancy is from week 1 until the end of week 13 (months 1 through 3).  Your body goes through many changes during pregnancy. The changes vary from  woman to woman.  You will have routine prenatal visits. During those visits, your health care provider will examine you, discuss any test results you may have, and talk with you about how you are feeling. This information is not intended to replace advice given to you by your health care provider. Make sure you discuss any questions you have with your health care provider. Document Revised: 01/20/2017 Document Reviewed: 01/20/2016 Elsevier Patient Education  2020 [employer]  

## 2019-08-22 ENCOUNTER — Ambulatory Visit (INDEPENDENT_AMBULATORY_CARE_PROVIDER_SITE_OTHER): Payer: No Typology Code available for payment source | Admitting: Nurse Practitioner

## 2019-08-22 VITALS — Ht 61.0 in | Wt 143.0 lb

## 2019-08-22 DIAGNOSIS — E538 Deficiency of other specified B group vitamins: Secondary | ICD-10-CM | POA: Diagnosis not present

## 2019-08-22 LAB — COMPLETE METABOLIC PANEL WITH GFR
AG Ratio: 2.2 (calc) (ref 1.0–2.5)
ALT: 11 U/L (ref 6–29)
AST: 14 U/L (ref 10–30)
Albumin: 4.4 g/dL (ref 3.6–5.1)
Alkaline phosphatase (APISO): 58 U/L (ref 31–125)
BUN: 12 mg/dL (ref 7–25)
CO2: 26 mmol/L (ref 20–32)
Calcium: 9.3 mg/dL (ref 8.6–10.2)
Chloride: 107 mmol/L (ref 98–110)
Creat: 0.77 mg/dL (ref 0.50–1.10)
GFR, Est African American: 124 mL/min/{1.73_m2} (ref >=60)
GFR, Est Non African American: 107 mL/min/{1.73_m2} (ref >=60)
Globulin: 2 g/dL (calc) (ref 1.9–3.7)
Glucose, Bld: 84 mg/dL (ref 65–99)
Potassium: 4.2 mmol/L (ref 3.5–5.3)
Sodium: 139 mmol/L (ref 135–146)
Total Bilirubin: 0.3 mg/dL (ref 0.2–1.2)
Total Protein: 6.4 g/dL (ref 6.1–8.1)

## 2019-08-22 LAB — CYTOLOGY - PAP
Comment: NEGATIVE
Diagnosis: NEGATIVE
High risk HPV: NEGATIVE

## 2019-08-22 LAB — B12 AND FOLATE PANEL
Folate: 14.8 ng/mL
Vitamin B-12: 349 pg/mL (ref 200–1100)

## 2019-08-22 LAB — LIPID PANEL W/REFLEX DIRECT LDL
Cholesterol: 179 mg/dL (ref <200)
HDL: 61 mg/dL (ref >=50)
LDL Cholesterol (Calc): 105 mg/dL (calc) — ABNORMAL HIGH
Non-HDL Cholesterol (Calc): 118 mg/dL (calc) (ref <130)
Total CHOL/HDL Ratio: 2.9 (calc) (ref <5.0)
Triglycerides: 52 mg/dL (ref <150)

## 2019-08-22 MED ORDER — CYANOCOBALAMIN 1000 MCG/ML IJ SOLN
1000.0000 ug | Freq: Once | INTRAMUSCULAR | Status: AC
Start: 2019-08-22 — End: 2019-08-22
  Administered 2019-08-22: 1000 ug via INTRAMUSCULAR

## 2019-08-22 NOTE — Progress Notes (Signed)
Patient is here for a vitamin B12 injection.  B12 injection to right deltoid with no apparent complications. Patient advised to schedule next injection in 7 days.

## 2019-08-22 NOTE — Progress Notes (Signed)
Vanessa Winters,   Cholesterol looks great. Kidney, liver, glucose look great. B12 still on low side of normal and probably going to trend down. I would start shots every 6 weeks and then we could recheck and get you to the 600pg levels.

## 2019-08-23 ENCOUNTER — Encounter: Payer: Self-pay | Admitting: Physician Assistant

## 2019-08-23 DIAGNOSIS — E7801 Familial hypercholesterolemia: Secondary | ICD-10-CM | POA: Insufficient documentation

## 2019-08-23 DIAGNOSIS — E538 Deficiency of other specified B group vitamins: Secondary | ICD-10-CM | POA: Insufficient documentation

## 2019-08-23 MED ORDER — FLUCONAZOLE 150 MG PO TABS: 150.0000 mg | ORAL_TABLET | Freq: Once | ORAL | 0 refills | Status: AC

## 2019-08-23 MED FILL — FLUCONAZOLE 150 MG TABS: 150 | 1 days supply | Qty: 1 | Fill #0

## 2019-08-23 NOTE — Progress Notes (Signed)
No I can think of replacement, fortunately the increased blood volume early in pregnancy will probably be beneficial to her and I think compression hose would also be helpful.

## 2019-08-23 NOTE — Progress Notes (Signed)
Vanessa Winters,   Negative for HPV. Normal cells. Great pap. Repeat in 3 years. There was some fungal elements present. I will send you diflucan over to treat. Where do you want it sent?

## 2019-08-30 MED FILL — LAMOTRIGINE 100 MG TABS: 100 | 30 days supply | Qty: 30 | Fill #1

## 2019-08-30 MED FILL — buPROPion HCL ER (XL) 150 M: 150 | 30 days supply | Qty: 30 | Fill #1

## 2019-09-03 ENCOUNTER — Ambulatory Visit (INDEPENDENT_AMBULATORY_CARE_PROVIDER_SITE_OTHER): Payer: No Typology Code available for payment source | Admitting: Psychology

## 2019-09-03 DIAGNOSIS — F3181 Bipolar II disorder: Secondary | ICD-10-CM

## 2019-09-03 DIAGNOSIS — F902 Attention-deficit hyperactivity disorder, combined type: Secondary | ICD-10-CM | POA: Diagnosis not present

## 2019-09-03 DIAGNOSIS — F422 Mixed obsessional thoughts and acts: Secondary | ICD-10-CM | POA: Diagnosis not present

## 2019-09-05 ENCOUNTER — Ambulatory Visit: Payer: No Typology Code available for payment source | Admitting: Psychology

## 2019-09-05 DIAGNOSIS — F3181 Bipolar II disorder: Secondary | ICD-10-CM

## 2019-09-05 DIAGNOSIS — F422 Mixed obsessional thoughts and acts: Secondary | ICD-10-CM

## 2019-09-05 DIAGNOSIS — F902 Attention-deficit hyperactivity disorder, combined type: Secondary | ICD-10-CM | POA: Diagnosis not present

## 2019-09-16 ENCOUNTER — Other Ambulatory Visit: Payer: Self-pay

## 2019-09-16 ENCOUNTER — Telehealth: Payer: No Typology Code available for payment source | Admitting: Psychiatry

## 2019-09-16 ENCOUNTER — Telehealth (INDEPENDENT_AMBULATORY_CARE_PROVIDER_SITE_OTHER): Payer: No Typology Code available for payment source | Admitting: Psychiatry

## 2019-09-16 ENCOUNTER — Encounter (HOSPITAL_COMMUNITY): Payer: Self-pay | Admitting: Psychiatry

## 2019-09-16 DIAGNOSIS — F422 Mixed obsessional thoughts and acts: Secondary | ICD-10-CM | POA: Diagnosis not present

## 2019-09-16 DIAGNOSIS — F3181 Bipolar II disorder: Secondary | ICD-10-CM

## 2019-09-16 NOTE — Progress Notes (Signed)
BH MD/PA/NP OP Progress Note  Virtual Visit via Video Note  I connected with Vanessa Winters on 09/16/19 at  8:30 AM EDT by a video enabled telemedicine application and verified that I am speaking with the correct person using two identifiers.  Location: Patient: Office Provider: Clinic   I discussed the limitations of evaluation and management by telemedicine and the availability of in person appointments. The patient expressed understanding and agreed to proceed.  I provided 18 minutes of non-face-to-face time during this encounter.     09/16/2019 8:34 AM Vanessa Winters  MRN:  [id number]  Chief Complaint:  "I am doing well. I found out last week that I am pregnant so I stopped all my medications."  HPI: Patient reported that everything has been going well for her.  She informed that she was on occasion last week and had a great time.  She also informed that she follow-up that she is pregnant during the vacation and at that point she decided to discontinue her medications Lamictal and Wellbutrin.  She stated that at this point she is doing great and is excited about the future. Writer informed her that her decision will be supported and that if she does not want to take any medications during her pregnancy we can manage her conditions that way.  She was informed that Lamictal has safety data during pregnancy and if she feels that she needs it to be restarted we can address that after 14 weeks of gestation when she is in her second trimester pregnancy. Patient was agreeable to this suggestion and stated that she would like to touch base and is interested in her second trimester pregnancy. She informed that she did complete her ADHD evaluation and that based on her preliminary findings she does meet criteria for ADHD.  She stated that she has given consent for the person who did her testing to send report to the writer. Patient appeared to be happy about the pregnancy news and is looking forward  to the ongoing months.   Visit Diagnosis:    ICD-10-CM   1. Bipolar 2 disorder (HCC)  F31.81   2. Mixed obsessional thoughts and acts  F42.2     Past Psychiatric History:  OCD and anxiety  Past Medical History:  Past Medical History:  Diagnosis Date  . Colon polyps   . Discharge from right nipple 07/2016  . GERD (gastroesophageal reflux disease)   . Hyperlipidemia   . Migraines   . Seasonal allergies     Past Surgical History:  Procedure Laterality Date  . BREAST DUCTAL SYSTEM EXCISION Right 08/04/2016   Procedure: RIGHT NIPPLE DUCT EXCISION;  Surgeon: Manus Rudd, MD;  Location: [facility];  Service: General;  Laterality: Right;  . WISDOM TOOTH EXTRACTION      Family Psychiatric History:  Two sisters and mom diagnosed with bipolar disorder (unspecified). One sister taking Latuda, other sister and mom untreated.    Family History:  Family History  Problem Relation Age of Onset  . Hypertension Mother   . Breast cancer Mother   . Hyperlipidemia Father   . Breast cancer Maternal Grandmother   . Leukemia Paternal Grandmother   . Parkinson's disease Paternal Grandfather   . Skin cancer Other        uncle  . Heart attack Other        great uncle  . Stroke Other        great uncle    Social History:  Social History  Socioeconomic History  . Marital status: Married    Spouse name: Not on file  . Number of children: Not on file  . Years of education: Not on file  . Highest education level: Not on file  Occupational History  . Not on file  Tobacco Use  . Smoking status: Current Every Day Smoker    Packs/day: 0.50    Years: 6.00    Pack years: 3.00    Types: E-cigarettes  . Smokeless tobacco: Never Used  Vaping Use  . Vaping Use: Never used  Substance and Sexual Activity  . Alcohol use: Yes    Comment: Occasional  . Drug use: Never  . Sexual activity: Yes    Partners: Male    Birth control/protection: None    Comment: nuvaring  Other  Topics Concern  . Not on file  Social History Narrative  . Not on file   Social Determinants of Health   Financial Resource Strain:   . Difficulty of Paying Living Expenses:   Food Insecurity:   . Worried About Programme researcher, broadcasting/film/video in the Last Year:   . Barista in the Last Year:   Transportation Needs:   . Freight forwarder (Medical):   Marland Kitchen Lack of Transportation (Non-Medical):   Physical Activity:   . Days of Exercise per Week:   . Minutes of Exercise per Session:   Stress:   . Feeling of Stress :   Social Connections:   . Frequency of Communication with Friends and Family:   . Frequency of Social Gatherings with Friends and Family:   . Attends Religious Services:   . Active Member of Clubs or Organizations:   . Attends Banker Meetings:   Marland Kitchen Marital Status:     Allergies:  Allergies  Allergen Reactions  . Adhesive [Tape] Other (See Comments)    STERI-STRIPS:  BLISTERS  . Buspar [Buspirone]     Constipation.     Metabolic Disorder Labs: No results found for: HGBA1C, MPG Lab Results  Component Value Date   PROLACTIN 7.5 07/09/2019   PROLACTIN 10.4 07/18/2017   Lab Results  Component Value Date   CHOL 179 08/21/2019   TRIG 52 08/21/2019   HDL 61 08/21/2019   CHOLHDL 2.9 08/21/2019   VLDL 10 12/05/2014   LDLCALC 105 (H) 08/21/2019   LDLCALC 112 (H) 07/25/2018   Lab Results  Component Value Date   TSH 0.79 07/09/2019   TSH 1.64 07/25/2018    Therapeutic Level Labs: No results found for: LITHIUM No results found for: VALPROATE No components found for:  CBMZ  Current Medications: Current Outpatient Medications  Medication Sig Dispense Refill  . atorvastatin (LIPITOR) 20 MG tablet Take 1 tablet (20 mg total) by mouth at bedtime. 90 tablet 1  . buPROPion (WELLBUTRIN XL) 150 MG 24 hr tablet Take 1 tablet (150 mg total) by mouth in the morning. 30 tablet 2  . clonazePAM (KLONOPIN) 0.5 MG tablet Take 1 tablet (0.5 mg total) by mouth 2  (two) times daily as needed for anxiety. 30 tablet 0  . Galcanezumab-gnlm (EMGALITY) 120 MG/ML SOAJ Inject 1 pen into the skin every 30 (thirty) days. 1 pen 1  . lamoTRIgine (LAMICTAL) 100 MG tablet Take 1 tablet (100 mg total) by mouth daily. 30 tablet 2  . midodrine (PROAMATINE) 5 MG tablet Take 1 tablet (5 mg total) by mouth 3 (three) times daily with meals. 270 tablet 3  . ondansetron (ZOFRAN-ODT) 8 MG disintegrating  tablet Take 1 tablet (8 mg total) by mouth every 8 (eight) hours as needed for nausea. 30 tablet 3  . rizatriptan (MAXALT) 10 MG tablet TAKE 1 TABLET (10 MG TOTAL) BY MOUTH AS NEEDED FOR MIGRAINE. MAY REPEAT IN 2 HOURS IF NEEDED *NO MORE THAN 2 IN 24 HRS* 10 tablet 2   No current facility-administered medications for this visit.     Musculoskeletal: Strength & Muscle Tone: Not assessed. Visit via telehealth.  Gait & Station: Not assessed. Visit via telehealth.  Patient leans: Not assessed. Visit via telehealth.   Psychiatric Specialty Exam: Review of Systems  There were no vitals taken for this visit.There is no height or weight on file to calculate BMI.  General Appearance: Well Groomed  Eye Contact:  Good  Speech:  Normal Rate  Volume:  Normal  Mood:  Euthymic  Affect:  Congruent  Thought Process:  Coherent, Goal Directed and Linear  Orientation:  Full (Time, Place, and Person)  Thought Content: WDL and Logical   Suicidal Thoughts:  No  Homicidal Thoughts:  No  Memory:  Immediate;   Good Recent;   Good Remote;   Good  Judgement:  Good  Insight:  Good  Psychomotor Activity:  Normal  Concentration:  Concentration: Good and Attention Span: Good  Recall:  Good  Fund of Knowledge: Good  Language: Good  Akathisia:  No  Handed:  Right  AIMS (if indicated): not done visit telehealth  Assets:  Communication Skills Desire for Improvement Financial Resources/Insurance Housing  ADL's:  Intact  Cognition: WNL  Sleep:  Good   Screenings: GAD-7     Office  Visit from 08/21/2019 in [facility] Office Visit from 03/01/2019 in [facility] Office Visit from 09/05/2018 in [facility] Abstract from 07/19/2018 in [facility]  Total GAD-7 Score 4 1 13 13     PHQ2-9     Office Visit from 08/21/2019 in [facility] Office Visit from 03/01/2019 in [facility] Office Visit from 09/05/2018 in [facility] At [facility] Abstract from 07/19/2018 in [facility] At [facility]  PHQ-2 Total Score 2 2 2 2   PHQ-9 Total Score 4 7 12 10        Assessment and Plan: Patient recently found out that she is pregnant and has decided to discontinue medications.  Patient was informed that there is safety data suggesting that the medical can be used safely during pregnancy however her decision will be supported and she was informed to follow-up in 3 months when she will be in second trimester to reassess how she is doing without medications.  Patient was agreeable with this plan.  1. Bipolar 2 disorder (HCC) -Patient discontinued her medications Wellbutrin and Lamictal recently after finding out she is pregnant.  2. Mixed obsessional thoughts and acts   Follow up in 3 months.    [facility], MD 09/16/2019, 8:34 AM

## 2019-10-10 ENCOUNTER — Telehealth: Payer: Self-pay

## 2019-10-10 DIAGNOSIS — Z1152 Encounter for screening for COVID-19: Secondary | ICD-10-CM

## 2019-10-10 NOTE — Telephone Encounter (Signed)
Upcoming travel. Tested for covid.

## 2019-10-12 LAB — SPECIMEN STATUS REPORT

## 2019-10-12 LAB — NOVEL CORONAVIRUS, NAA: SARS-CoV-2, NAA: NOT DETECTED

## 2019-10-12 LAB — SARS-COV-2, NAA 2 DAY TAT

## 2019-10-14 ENCOUNTER — Other Ambulatory Visit: Payer: Self-pay | Admitting: Physician Assistant

## 2019-10-14 DIAGNOSIS — J0100 Acute maxillary sinusitis, unspecified: Secondary | ICD-10-CM

## 2019-10-14 MED ORDER — AZITHROMYCIN 250 MG PO TABS
ORAL_TABLET | ORAL | 0 refills | Status: DC
Start: 2019-10-14 — End: 2020-05-14

## 2019-10-14 MED FILL — AZITHROMYCIN 250 MG TABS: 250 | 5 days supply | Qty: 6 | Fill #0

## 2019-10-14 NOTE — Progress Notes (Signed)
Pt has had URI symptoms for over a week. She is covid negative. She denies any fever, chills, SOB. She has lots of facial pain and pressure and sinus drainage. She is taking claritin. She is [redacted] weeks pregnant. Treated with zpak for sinusitis.

## 2019-10-17 LAB — OB RESULTS CONSOLE HEPATITIS B SURFACE ANTIGEN: Hepatitis B Surface Ag: NEGATIVE

## 2019-10-17 LAB — OB RESULTS CONSOLE HIV ANTIBODY (ROUTINE TESTING): HIV: NONREACTIVE

## 2019-10-17 LAB — OB RESULTS CONSOLE ABO/RH: RH Type: POSITIVE

## 2019-10-17 LAB — OB RESULTS CONSOLE RPR: RPR: NONREACTIVE

## 2019-10-17 LAB — OB RESULTS CONSOLE ANTIBODY SCREEN: Antibody Screen: NEGATIVE

## 2019-10-17 LAB — OB RESULTS CONSOLE RUBELLA ANTIBODY, IGM: Rubella: IMMUNE

## 2019-11-18 ENCOUNTER — Ambulatory Visit: Payer: No Typology Code available for payment source

## 2019-11-18 ENCOUNTER — Other Ambulatory Visit: Payer: No Typology Code available for payment source

## 2019-12-12 ENCOUNTER — Telehealth (HOSPITAL_COMMUNITY): Payer: No Typology Code available for payment source | Admitting: Psychiatry

## 2020-01-08 DIAGNOSIS — F429 Obsessive-compulsive disorder, unspecified: Secondary | ICD-10-CM | POA: Insufficient documentation

## 2020-01-08 DIAGNOSIS — F902 Attention-deficit hyperactivity disorder, combined type: Secondary | ICD-10-CM | POA: Insufficient documentation

## 2020-01-20 ENCOUNTER — Encounter (HOSPITAL_COMMUNITY): Payer: Self-pay | Admitting: Psychiatry

## 2020-01-20 ENCOUNTER — Telehealth (INDEPENDENT_AMBULATORY_CARE_PROVIDER_SITE_OTHER): Payer: No Typology Code available for payment source | Admitting: Psychiatry

## 2020-01-20 ENCOUNTER — Other Ambulatory Visit: Payer: Self-pay

## 2020-01-20 DIAGNOSIS — F422 Mixed obsessional thoughts and acts: Secondary | ICD-10-CM

## 2020-01-20 DIAGNOSIS — F3181 Bipolar II disorder: Secondary | ICD-10-CM

## 2020-01-20 NOTE — Progress Notes (Signed)
BH MD/PA/NP OP Progress Note  Virtual Visit via Video Note  I connected with Vanessa Winters on 01/20/20 at  2:30 PM EST by a video enabled telemedicine application and verified that I am speaking with the correct person using two identifiers.  Location: Patient: Office Provider: Clinic   I discussed the limitations of evaluation and management by telemedicine and the availability of in person appointments. The patient expressed understanding and agreed to proceed.  I provided 18 minutes of non-face-to-face time during this encounter.     01/20/2020 2:35 PM Vanessa Winters  MRN:  [id number]  Chief Complaint:  " I am doing well, I feel sad sometimes."   HPI: 24 year old female patient contacted today for follow-up psychiatric evaluation for medication management.  Patient has a psychiatric history of bipolar 2 disorder.  She discontinued the use of her previous psychiatric medications in July 2021 when she learned that she was expecting a baby.  She reports that overall she feels like things are going well for her and she only occasionally has sad days.  She reports that she believes that she will be able to continue to manage her mood throughout the duration of her pregnancy without using any psychiatric medications.  However, she reports concerns about the development of postpartum depression after her delivery.  The patient reports that she is due to deliver her baby on 04/28/20 and she hopes to restart a medication regimen at that time.  Provider and patient discussed in depth about a plan to follow-up with medication management closer to her due date to reevaluate her symptoms and determine a new medication regimen for her to reduce the likelihood of severe postpartum depression symptoms.  Patient is agreeable with this plan at this time.   In addition, the patient reports that she has noticed that she is less focused at work than she was while taking Wellbutrin.  She reports that Wellbutrin  really helped with her concentration and ability to focus in the past and she looks forward to restarting this medication in the future.  The patient also reports that she was evaluated by [facility] for symptoms of ADHD and she released the information to be shared with the provider.  Provider was able to review the patient's test results during today's encounter.  The behavioral testing results determined that the patient has a normal IQ as expected and she meets the diagnostic criteria for ADHD.     The patient endorses occasional depressive symptoms and recent difficulty with concentration and focus.  However, the patient would like to delay restarting any psychiatric medications at this time until after she gives birth to her baby in March of next year.  Patient is agreeable to monitor her symptoms and notify the provider of any changes or worsening symptoms.    Patient denies any SI, HI, paranoia, or hallucinations.     Visit Diagnosis:    ICD-10-CM   1. Bipolar 2 disorder (HCC)  F31.81   2. Mixed obsessional thoughts and acts  F42.2     Past Psychiatric History:  OCD and anxiety  Past Medical History:  Past Medical History:  Diagnosis Date  . Colon polyps   . Discharge from right nipple 07/2016  . GERD (gastroesophageal reflux disease)   . Hyperlipidemia   . Migraines   . Seasonal allergies     Past Surgical History:  Procedure Laterality Date  . BREAST DUCTAL SYSTEM EXCISION Right 08/04/2016   Procedure: RIGHT NIPPLE DUCT EXCISION;  Surgeon:  Manus Rudd, MD;  Location: [facility];  Service: General;  Laterality: Right;  . WISDOM TOOTH EXTRACTION      Family Psychiatric History:  Two sisters and mom diagnosed with bipolar disorder (unspecified). One sister taking Latuda, other sister and mom untreated.    Family History:  Family History  Problem Relation Age of Onset  . Hypertension Mother   . Breast cancer Mother   . Hyperlipidemia  Father   . Breast cancer Maternal Grandmother   . Leukemia Paternal Grandmother   . Parkinson's disease Paternal Grandfather   . Skin cancer Other        uncle  . Heart attack Other        great uncle  . Stroke Other        great uncle    Social History:  Social History   Socioeconomic History  . Marital status: Married    Spouse name: Not on file  . Number of children: Not on file  . Years of education: Not on file  . Highest education level: Not on file  Occupational History  . Not on file  Tobacco Use  . Smoking status: Current Every Day Smoker    Packs/day: 0.50    Years: 6.00    Pack years: 3.00    Types: E-cigarettes  . Smokeless tobacco: Never Used  Vaping Use  . Vaping Use: Never used  Substance and Sexual Activity  . Alcohol use: Yes    Comment: Occasional  . Drug use: Never  . Sexual activity: Yes    Partners: Male    Birth control/protection: None    Comment: nuvaring  Other Topics Concern  . Not on file  Social History Narrative  . Not on file   Social Determinants of Health   Financial Resource Strain:   . Difficulty of Paying Living Expenses: Not on file  Food Insecurity:   . Worried About Programme researcher, broadcasting/film/video in the Last Year: Not on file  . Ran Out of Food in the Last Year: Not on file  Transportation Needs:   . Lack of Transportation (Medical): Not on file  . Lack of Transportation (Non-Medical): Not on file  Physical Activity:   . Days of Exercise per Week: Not on file  . Minutes of Exercise per Session: Not on file  Stress:   . Feeling of Stress : Not on file  Social Connections:   . Frequency of Communication with Friends and Family: Not on file  . Frequency of Social Gatherings with Friends and Family: Not on file  . Attends Religious Services: Not on file  . Active Member of Clubs or Organizations: Not on file  . Attends Banker Meetings: Not on file  . Marital Status: Not on file    Allergies:  Allergies   Allergen Reactions  . Adhesive [Tape] Other (See Comments)    STERI-STRIPS:  BLISTERS  . Buspar [Buspirone]     Constipation.     Metabolic Disorder Labs: No results found for: HGBA1C, MPG Lab Results  Component Value Date   PROLACTIN 7.5 07/09/2019   PROLACTIN 10.4 07/18/2017   Lab Results  Component Value Date   CHOL 179 08/21/2019   TRIG 52 08/21/2019   HDL 61 08/21/2019   CHOLHDL 2.9 08/21/2019   VLDL 10 12/05/2014   LDLCALC 105 (H) 08/21/2019   LDLCALC 112 (H) 07/25/2018   Lab Results  Component Value Date   TSH 0.79 07/09/2019   TSH  1.64 07/25/2018    Therapeutic Level Labs: No results found for: LITHIUM No results found for: VALPROATE No components found for:  CBMZ  Current Medications: Current Outpatient Medications  Medication Sig Dispense Refill  . atorvastatin (LIPITOR) 20 MG tablet Take 1 tablet (20 mg total) by mouth at bedtime. 90 tablet 1  . azithromycin (ZITHROMAX Z-PAK) 250 MG tablet Take 2 tablets (500 mg) on  Day 1,  followed by 1 tablet (250 mg) once daily on Days 2 through 5. 6 tablet 0  . Galcanezumab-gnlm (EMGALITY) 120 MG/ML SOAJ Inject 1 pen into the skin every 30 (thirty) days. 1 pen 1  . midodrine (PROAMATINE) 5 MG tablet Take 1 tablet (5 mg total) by mouth 3 (three) times daily with meals. 270 tablet 3  . ondansetron (ZOFRAN-ODT) 8 MG disintegrating tablet Take 1 tablet (8 mg total) by mouth every 8 (eight) hours as needed for nausea. 30 tablet 3  . rizatriptan (MAXALT) 10 MG tablet TAKE 1 TABLET (10 MG TOTAL) BY MOUTH AS NEEDED FOR MIGRAINE. MAY REPEAT IN 2 HOURS IF NEEDED *NO MORE THAN 2 IN 24 HRS* 10 tablet 2   No current facility-administered medications for this visit.     Musculoskeletal: Strength & Muscle Tone: Not assessed. Visit via telehealth.  Gait & Station: Not assessed. Visit via telehealth.  Patient leans: Not assessed. Visit via telehealth.   Psychiatric Specialty Exam:   There were no vitals taken for this  visit.There is no height or weight on file to calculate BMI.  General Appearance: Well Groomed  Eye Contact:  Good  Speech:  Normal Rate  Volume:  Normal  Mood:  Euthymic  Affect:  Congruent  Thought Process:  Coherent, Goal Directed and Linear  Orientation:  Full (Time, Place, and Person)  Thought Content: WDL and Logical   Suicidal Thoughts:  No  Homicidal Thoughts:  No  Memory:  Immediate;   Good Recent;   Good Remote;   Good  Judgement:  Good  Insight:  Good  Psychomotor Activity:  Normal  Concentration:  Concentration: Good and Attention Span: Good  Recall:  Good  Fund of Knowledge: Good  Language: Good  Akathisia:  No  Handed:  Right  AIMS (if indicated): not done visit telehealth  Assets:  Communication Skills Desire for Improvement Financial Resources/Insurance Housing  ADL's:  Intact  Cognition: WNL  Sleep:  Good   Screenings: GAD-7     [facility] Office Visit from [facility]/2019 [facility] [place] Office [facility] [place] [facility] [place]  Total GAD-7 Score 4 1 13 13     PHQ2-9     [facility] [place] [facility] [place] [facility] [place] Abstract [facility] [place]  PHQ-2 Total Score 2 2 2 2   PHQ-9 Total Score 4 7 12 10        Assessment and Plan: Pt seems to be doing fairly well with out her medications right now. Pregnancy is progressing well as per patient. Will continue to monitor. Pt was informed of mood chart available online that she can utilize to monitor her mood at home.   1. Bipolar 2  disorder (HCC)   2. Mixed obsessional  thoughts and acts   Continue to manage with out medications during the pregnant state. Follow up in 10 weeks.    Zena Amos, MD 01/20/2020, 2:35 PM

## 2020-02-22 NOTE — L&D Delivery Note (Signed)
Delivery Note At 4:27 PM a viable and healthy female was delivered via Vaginal, Spontaneous (Presentation: Left Occiput Anterior).  APGAR: 8, 9; weight  .   Placenta status: Spontaneous, Intact.  Cord: 3 vessels with the following complications: None.  Cord pH: na  Anesthesia: Epidural Episiotomy: None Lacerations: 2nd degree;Perineal Suture Repair: 2.0 vicryl rapide Est. Blood Loss (mL): 200  Mom to postpartum.  Baby to Couplet care / Skin to Skin.  TAAVON,RICHARD J 05/13/2020, 5:23 PM

## 2020-02-25 NOTE — Progress Notes (Deleted)
Cardiology Office Note:    Date:  02/25/2020   ID:  Vanessa Winters, DOB Sep 12, 1995, MRN [id number]  PCP:  Jomarie Longs, PA-C  Cardiologist:  Norman Herrlich, MD    Referring MD: Jomarie Longs, PA-C    ASSESSMENT:    No diagnosis found. PLAN:    In order of problems listed above:  1. ***   Next appointment: ***   Medication Adjustments/Labs and Tests Ordered: Current medicines are reviewed at length with the patient today.  Concerns regarding medicines are outlined above.  No orders of the defined types were placed in this encounter.  No orders of the defined types were placed in this encounter.   No chief complaint on file.   History of Present Illness:     Vanessa Winters is a 25 y.o. 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  She was referred to endocrinology for evaluation with subtle findings suggesting the possibility of pituitary hypersecretion.  Echocardiogram performed in February showed normal left ventricular and right ventricular function no significant valvular abnormality and no indication of cardiomyopathy.  An event monitor showed no significant abnormality of heart rhythm. Compliance with diet, lifestyle and medications: *** Past Medical History:  Diagnosis Date  . Colon polyps   . Discharge from right nipple 07/2016  . GERD (gastroesophageal reflux disease)   . Hyperlipidemia   . Migraines   . Seasonal allergies     Past Surgical History:  Procedure Laterality Date  . BREAST DUCTAL SYSTEM EXCISION Right 08/04/2016   Procedure: RIGHT NIPPLE DUCT EXCISION;  Surgeon: Manus Rudd, MD;  Location: [facility];  Service: General;  Laterality: Right;  . WISDOM TOOTH EXTRACTION      Current Medications: No outpatient medications have been marked as taking for the 02/26/20 encounter (Appointment) with Baldo Daub, MD.     Allergies:   Adhesive [tape] and  Buspar [buspirone]   Social History   Socioeconomic History  . Marital status: Married    Spouse name: Not on file  . Number of children: Not on file  . Years of education: Not on file  . Highest education level: Not on file  Occupational History  . Not on file  Tobacco Use  . Smoking status: Current Every Day Smoker    Packs/day: 0.50    Years: 6.00    Pack years: 3.00    Types: E-cigarettes  . Smokeless tobacco: Never Used  Vaping Use  . Vaping Use: Never used  Substance and Sexual Activity  . Alcohol use: Yes    Comment: Occasional  . Drug use: Never  . Sexual activity: Yes    Partners: Male    Birth control/protection: None    Comment: nuvaring  Other Topics Concern  . Not on file  Social History Narrative  . Not on file   Social Determinants of Health   Financial Resource Strain: Not on file  Food Insecurity: Not on file  Transportation Needs: Not on file  Physical Activity: Not on file  Stress: Not on file  Social Connections: Not on file     Family History: The patient's ***family history includes Breast cancer in her maternal grandmother and mother; Heart attack in an other family member; Hyperlipidemia in her father; Hypertension in her mother; Leukemia in her paternal grandmother; Parkinson's disease in her paternal grandfather; Skin cancer in an other family member; Stroke in an other family member. ROS:  Please see the history of present illness.    All other systems reviewed and are negative.  EKGs/Labs/Other Studies Reviewed:    The following studies were reviewed today:  EKG:  EKG ordered today and personally reviewed.  The ekg ordered today demonstrates ***  Recent Labs: 07/09/2019: TSH 0.79 08/21/2019: ALT 11; BUN 12; Creat 0.77; Potassium 4.2; Sodium 139  Recent Lipid Panel    Component Value Date/Time   CHOL 179 08/21/2019 0835   TRIG 52 08/21/2019 0835   HDL 61 08/21/2019 0835   CHOLHDL 2.9 08/21/2019 0835   VLDL 10 12/05/2014 0954    LDLCALC 105 (H) 08/21/2019 0835    Physical Exam:    VS:  There were no vitals taken for this visit.    Wt Readings from Last 3 Encounters:  08/22/19 143 lb (64.9 kg)  08/21/19 143 lb (64.9 kg)  08/14/19 141 lb (64 kg)     GEN: *** Well nourished, well developed in no acute distress HEENT: Normal NECK: No JVD; No carotid bruits LYMPHATICS: No lymphadenopathy CARDIAC: ***RRR, no murmurs, rubs, gallops RESPIRATORY:  Clear to auscultation without rales, wheezing or rhonchi  ABDOMEN: Soft, non-tender, non-distended MUSCULOSKELETAL:  No edema; No deformity  SKIN: Warm and dry NEUROLOGIC:  Alert and oriented x 3 PSYCHIATRIC:  Normal affect    Signed, Norman Herrlich, MD  02/25/2020 12:57 PM    [facility]

## 2020-02-26 ENCOUNTER — Telehealth (INDEPENDENT_AMBULATORY_CARE_PROVIDER_SITE_OTHER): Payer: No Typology Code available for payment source | Admitting: Cardiology

## 2020-02-26 ENCOUNTER — Ambulatory Visit (INDEPENDENT_AMBULATORY_CARE_PROVIDER_SITE_OTHER): Payer: No Typology Code available for payment source

## 2020-02-26 ENCOUNTER — Encounter: Payer: Self-pay | Admitting: Cardiology

## 2020-02-26 VITALS — BP 98/70 | HR 114 | Ht 61.0 in | Wt 166.0 lb

## 2020-02-26 DIAGNOSIS — R002 Palpitations: Secondary | ICD-10-CM

## 2020-02-26 NOTE — Progress Notes (Signed)
Virtual Visit via Video Note   This visit type was conducted due to national recommendations for restrictions regarding the COVID-19 Pandemic (e.g. social distancing) in an effort to limit this patient's exposure and mitigate transmission in our community.  Due to her co-morbid illnesses, this patient is at least at moderate risk for complications without adequate follow up.  This format is felt to be most appropriate for this patient at this time.  All issues noted in this document were discussed and addressed.  A limited physical exam was performed with this format.  Please refer to the patient's chart for her consent to telehealth for [facility].    Date:  02/26/2020   ID:  Vanessa Winters, DOB 1995-05-10, MRN [id number] The patient was identified using 2 identifiers.  Patient Location: Home Provider Location: Home Office  PCP:  Jomarie Longs, PA-C  Cardiologist:  No primary care provider on file. Caryl Pina MD Electrophysiologist:  None   Evaluation Performed:  Follow-Up Visit  Chief Complaint:  My HR is rapid  History of Present Illness:    Vanessa Winters is a 25 y.o. 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  She was referred to endocrinology for evaluation with subtle findings suggesting the possibility of pituitary hypersecretion.  Echocardiogram performed in February showed normal left ventricular and right ventricular function no significant valvular abnormality and no indication of cardiomyopathy.  An event monitor showed no significant abnormality of heart rhythm.  The patient does not have symptoms concerning for COVID-19 infection (fever, chills, cough, or new shortness of breath).   It is nice to know that Vanessa Winters is pregnant and her due date is 05/12/2020. Previously she had symptomatic hypotension with reflex sinus tachycardia and did well with midodrine but she stopped it at the onset of  pregnancy this was appropriate She is done well she is not having symptomatic hypotension but she notices with her smart watch that her heart rates at times are in the 1 30-1 50 range and a few times her heart rate is as low as 40.  She is not symptomatic.  She has no underlying structural heart disease. She was advised by her OB/GYN team to get back in touch with cardiology. Past Medical History:  Diagnosis Date  . Colon polyps   . Discharge from right nipple 07/2016  . GERD (gastroesophageal reflux disease)   . Hyperlipidemia   . Migraines   . Seasonal allergies    Past Surgical History:  Procedure Laterality Date  . BREAST DUCTAL SYSTEM EXCISION Right 08/04/2016   Procedure: RIGHT NIPPLE DUCT EXCISION;  Surgeon: Manus Rudd, MD;  Location: [facility];  Service: General;  Laterality: Right;  . WISDOM TOOTH EXTRACTION       No outpatient medications have been marked as taking for the 02/26/20 encounter (Video Visit) with Baldo Daub, MD.     Allergies:   Adhesive [tape] and Buspar [buspirone]   Social History   Tobacco Use  . Smoking status: Current Every Day Smoker    Packs/day: 0.50    Years: 6.00    Pack years: 3.00    Types: E-cigarettes  . Smokeless tobacco: Never Used  Vaping Use  . Vaping Use: Never used  Substance Use Topics  . Alcohol use: Yes    Comment: Occasional  . Drug use: Never     Family Hx: The patient's family history includes Breast cancer in her maternal  grandmother and mother; Heart attack in an other family member; Hyperlipidemia in her father; Hypertension in her mother; Leukemia in her paternal grandmother; Parkinson's disease in her paternal grandfather; Skin cancer in an other family member; Stroke in an other family member.  ROS:   Please see the history of present illness.     All other systems reviewed and are negative.   Prior CV studies:   The following studies were reviewed today:    Labs/Other Tests and Data  Reviewed:    Studies reviewed for today's visit include echocardiogram 03/29/2019 showed normal cardiac structure  Her event monitor performed January 2021 showed normal sinus rhythm sinus tachycardia and rare atrial and ventricular premature beats.    Recent Labs: 07/09/2019: TSH 0.79 08/21/2019: ALT 11; BUN 12; Creat 0.77; Potassium 4.2; Sodium 139   Recent Lipid Panel Lab Results  Component Value Date/Time   CHOL 179 08/21/2019 08:35 AM   TRIG 52 08/21/2019 08:35 AM   HDL 61 08/21/2019 08:35 AM   CHOLHDL 2.9 08/21/2019 08:35 AM   LDLCALC 105 (H) 08/21/2019 08:35 AM    Wt Readings from Last 3 Encounters:  02/26/20 166 lb (75.3 kg)  08/22/19 143 lb (64.9 kg)  08/21/19 143 lb (64.9 kg)     Risk Assessment/Calculations:      Objective:    Vital Signs:  BP 98/70 (BP Location: Right Arm, Patient Position: Sitting, Cuff Size: Normal)   Pulse (!) 114   Ht 5\' 1"  (1.549 m)   Wt 166 lb (75.3 kg)   SpO2 99%   BMI 31.37 kg/m    VITAL SIGNS:  reviewed GEN:  no acute distress EYES:  sclerae anicteric, EOMI - Extraocular Movements Intact RESPIRATORY:  normal respiratory effort, symmetric expansion CARDIOVASCULAR:  no peripheral edema SKIN:  no rash, lesions or ulcers. MUSCULOSKELETAL:  no obvious deformities. NEURO:  alert and oriented x 3, no obvious focal deficit PSYCH:  normal affect  ASSESSMENT & PLAN:    1. Overall Vanessa Winters is doing well with her idiopathic hypotension and reflux sinus tachycardia.  I offered her reassurance that women even with severe structural heart disease do well with pregnancy and delivery and part of this is the compensatory increase in her blood volume.  She is asymptomatic with her sinus tachycardia and to assess heart rate variability and her concerns about heart rates in the 40s we will go ahead and apply a 3-day ZIO monitor to be mailed to her today.  I will think she requires repeat cardiac imaging.  If she required suppression for rapid heart  rhythm we could put her on a very low-dose of a pregnancy safe beta-blocker like atenolol which is safe for the patient and the child and if hypotension is a problem after delivery restart midodrine 5 mg 3 times daily but asked for the OB team to assess whether or not it safe for breast-feeding.  She will come back and see me in the office in 1 month  Fleet Contras   She would do a 3-day ZIO monitor She will use a 3-day ZIO monitor to assess her heart rhythm and provide reassuranc  We will do a 3-day ZIO monitor to assess rhythm and provide reassurance COVID-19 Education: The signs and symptoms of COVID-19 were discussed with the patient and how to seek care for testing (follow up with PCP or arrange E-visit).  The importance of social distancing was discussed today.  Time:   Today, I have spent 30 minutes with the patient with  telehealth technology discussing the above problems.     Medication Adjustments/Labs and Tests Ordered: Current medicines are reviewed at length with the patient today.  Concerns regarding medicines are outlined above.   Tests Ordered: Orders Placed This Encounter  Procedures  . LONG TERM MONITOR (3-14 DAYS)    Medication Changes: No orders of the defined types were placed in this encounter.   Follow Up:  In 1 month in 1 month(s)  Signed, Shirlee More, MD  02/26/2020 9:21 AM    [place]

## 2020-02-26 NOTE — Patient Instructions (Signed)
Medication Instructions:  Your physician recommends that you continue on your current medications as directed. Please refer to the Current Medication list given to you today.  *If you need a refill on your cardiac medications before your next appointment, please call your pharmacy*   Lab Work: None If you have labs (blood work) drawn today and your tests are completely normal, you will receive your results only by: Marland Kitchen MyChart Message (if you have MyChart) OR . A paper copy in the mail If you have any lab test that is abnormal or we need to change your treatment, we will call you to review the results.   Testing/Procedures: A zio monitor was ordered today. It will remain on for 3 days. You will then return monitor and event diary in provided box. It takes 1-2 weeks for report to be downloaded and returned to [place]. We will call you with the results. If monitor falls off or has orange flashing light, please call Zio for further instructions.      Follow-Up: At [facility], you and your health needs are our priority.  As part of our continuing mission to provide you with exceptional heart care, we have created designated Provider Care Teams.  These Care Teams include your primary Cardiologist (physician) and Advanced Practice Providers (APPs -  Physician Assistants and Nurse Practitioners) who all work together to provide you with the care you need, when you need it.  We recommend signing up for the patient portal called "MyChart".  Sign up information is provided on this After Visit Summary.  MyChart is used to connect with patients for Virtual Visits (Telemedicine).  Patients are able to view lab/test results, encounter notes, upcoming appointments, etc.  Non-urgent messages can be sent to your provider as well.   To learn more about what you can do with MyChart, go to [email or web address].    Your next appointment:   1 month(s)  The format for your next appointment:   Mychart Video  Visit.   Provider:   Norman Herrlich, MD   Other Instructions

## 2020-02-29 DIAGNOSIS — R002 Palpitations: Secondary | ICD-10-CM

## 2020-03-19 ENCOUNTER — Encounter: Payer: Self-pay | Admitting: Physician Assistant

## 2020-03-19 DIAGNOSIS — R000 Tachycardia, unspecified: Secondary | ICD-10-CM

## 2020-03-24 ENCOUNTER — Telehealth: Payer: No Typology Code available for payment source | Admitting: Cardiology

## 2020-03-24 NOTE — Progress Notes (Deleted)
Cardiology Office Note:    Date:  03/24/2020   ID:  Vanessa Winters, DOB 12-23-1995, MRN [id number]  PCP:  Jomarie Longs, PA-C  Cardiologist:  Norman Herrlich, MD    Referring MD: Jomarie Longs, PA-C    ASSESSMENT:    No diagnosis found. PLAN:    In order of problems listed above:  1. ***   Next appointment: ***   Medication Adjustments/Labs and Tests Ordered: Current medicines are reviewed at length with the patient today.  Concerns regarding medicines are outlined above.  No orders of the defined types were placed in this encounter.  No orders of the defined types were placed in this encounter.   No chief complaint on file.   History of Present Illness:    Vanessa Winters is a 25 y.o. 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  Echocardiogram February 2021 showed normal left and right ventricular function no findings of cardiomyopathy and event monitor showed no abnormality of heart rhythm.  When seen recently in the office she is pregnant and due date 05/12/2020 and was no longer taking midodrine.  A 3-day event monitor showed cardiac rhythm to be sinus with average heart rate of 99 and rare ventricular and supraventricular arrhythmia.  Compliance with diet, lifestyle and medications: Yes  We did a video visit I reviewed the results of her 3-day monitor heart rates are running from 100 to 130 bpm but she is asymptomatic and fortunately no episodes of hypotension.  No shortness of breath chest pain palpitation or syncope. Past Medical History:  Diagnosis Date  . Colon polyps   . Discharge from right nipple 07/2016  . GERD (gastroesophageal reflux disease)   . Hyperlipidemia   . Migraines   . Seasonal allergies     Past Surgical History:  Procedure Laterality Date  . BREAST DUCTAL SYSTEM EXCISION Right 08/04/2016   Procedure: RIGHT NIPPLE DUCT EXCISION;  Surgeon: Manus Rudd, MD;   Location: [facility];  Service: General;  Laterality: Right;  . WISDOM TOOTH EXTRACTION      Current Medications: No outpatient medications have been marked as taking for the 03/25/20 encounter (Appointment) with Baldo Daub, MD.     Allergies:   Adhesive [tape] and Buspar [buspirone]   Social History   Socioeconomic History  . Marital status: Married    Spouse name: Not on file  . Number of children: Not on file  . Years of education: Not on file  . Highest education level: Not on file  Occupational History  . Not on file  Tobacco Use  . Smoking status: Current Every Day Smoker    Packs/day: 0.50    Years: 6.00    Pack years: 3.00    Types: E-cigarettes  . Smokeless tobacco: Never Used  Vaping Use  . Vaping Use: Never used  Substance and Sexual Activity  . Alcohol use: Yes    Comment: Occasional  . Drug use: Never  . Sexual activity: Yes    Partners: Male    Birth control/protection: None    Comment: nuvaring  Other Topics Concern  . Not on file  Social History Narrative  . Not on file   Social Determinants of Health   Financial Resource Strain: Not on file  Food Insecurity: Not on file  Transportation Needs: Not on file  Physical Activity: Not on file  Stress: Not on file  Social Connections: Not on file  Family History: The patient's family history includes Breast cancer in her maternal grandmother and mother; Heart attack in an other family member; Hyperlipidemia in her father; Hypertension in her mother; Leukemia in her paternal grandmother; Parkinson's disease in her paternal grandfather; Skin cancer in an other family member; Stroke in an other family member. ROS:   Please see the history of present illness.    All other systems reviewed and are negative.  EKGs/Labs/Other Studies Reviewed:    The following studies were reviewed today:    Recent Labs: 07/09/2019: TSH 0.79 08/21/2019: ALT 11; BUN 12; Creat 0.77; Potassium 4.2;  Sodium 139  Recent Lipid Panel    Component Value Date/Time   CHOL 179 08/21/2019 0835   TRIG 52 08/21/2019 0835   HDL 61 08/21/2019 0835   CHOLHDL 2.9 08/21/2019 0835   VLDL 10 12/05/2014 0954   LDLCALC 105 (H) 08/21/2019 0835    Physical Exam:    VS:  There were no vitals taken for this visit.    Wt Readings from Last 3 Encounters:  02/26/20 166 lb (75.3 kg)  08/22/19 143 lb (64.9 kg)  08/21/19 143 lb (64.9 kg)     GEN: *** Well nourished, well developed in no acute distress HEENT: Normal NECK: No JVD; No carotid bruits LYMPHATICS: No lymphadenopathy CARDIAC: ***RRR, no murmurs, rubs, gallops RESPIRATORY:  Clear to auscultation without rales, wheezing or rhonchi  ABDOMEN: Soft, non-tender, non-distended MUSCULOSKELETAL:  No edema; No deformity  SKIN: Warm and dry NEUROLOGIC:  Alert and oriented x 3 PSYCHIATRIC:  Normal affect    Signed, Norman Herrlich, MD  03/24/2020 12:50 PM    [facility]

## 2020-03-25 ENCOUNTER — Telehealth (INDEPENDENT_AMBULATORY_CARE_PROVIDER_SITE_OTHER): Payer: No Typology Code available for payment source | Admitting: Cardiology

## 2020-03-25 ENCOUNTER — Encounter: Payer: Self-pay | Admitting: Cardiology

## 2020-03-25 VITALS — HR 85 | Ht 61.0 in | Wt 175.0 lb

## 2020-03-25 DIAGNOSIS — R000 Tachycardia, unspecified: Secondary | ICD-10-CM | POA: Diagnosis not present

## 2020-03-25 DIAGNOSIS — I959 Hypotension, unspecified: Secondary | ICD-10-CM

## 2020-03-25 NOTE — Progress Notes (Signed)
Virtual Visit via Video Note   This visit type was conducted due to national recommendations for restrictions regarding the COVID-19 Pandemic (e.g. social distancing) in an effort to limit this patient's exposure and mitigate transmission in our community.  Due to her co-morbid illnesses, this patient is at least at moderate risk for complications without adequate follow up.  This format is felt to be most appropriate for this patient at this time.  All issues noted in this document were discussed and addressed.  A limited physical exam was performed with this format.  Please refer to the patient's chart for her consent to telehealth for [facility].       Date:  03/25/2020   ID:  Vanessa Winters, DOB 06/11/1995, MRN [id number] The patient was identified using 2 identifiers.  Patient Location: Home Provider Location: Office/Clinic  PCP:  Jomarie Longs, PA-C  Cardiologist:  No primary care provider on file.  Dr. Dulce Sellar Electrophysiologist:  None   Evaluation Performed:  Follow-Up Visit  Chief Complaint: Follow-up after recent event monitor  History of Present Illness:    Vanessa Winters is a 25 y.o. 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  Echocardiogram February 2021 showed normal left and right ventricular function no findings of cardiomyopathy and event monitor showed no abnormality of heart rhythm.  When seen recently in the office she is pregnant and due date 05/12/2020 and was no longer taking midodrine.  A 3-day event monitor showed cardiac rhythm to be sinus with average heart rate of 99 and rare ventricular and supraventricular arrhythmia.  Compliance with diet, lifestyle and medications: Yes  We did a video visit I reviewed the results of her 3-day monitor heart rates are running from 100 to 130 bpm but she is asymptomatic and fortunately no episodes of hypotension.  No shortness of breath chest  pain palpitation or syncope.   The patient does not have symptoms concerning for COVID-19 infection (fever, chills, cough, or new shortness of breath).    Past Medical History:  Diagnosis Date  . Colon polyps   . Discharge from right nipple 07/2016  . GERD (gastroesophageal reflux disease)   . Hyperlipidemia   . Migraines   . Seasonal allergies    Past Surgical History:  Procedure Laterality Date  . BREAST DUCTAL SYSTEM EXCISION Right 08/04/2016   Procedure: RIGHT NIPPLE DUCT EXCISION;  Surgeon: Manus Rudd, MD;  Location: [facility];  Service: General;  Laterality: Right;  . WISDOM TOOTH EXTRACTION       No outpatient medications have been marked as taking for the 03/25/20 encounter (Telemedicine) with Baldo Daub, MD.     Allergies:   Adhesive [tape] and Buspar [buspirone]   Social History   Tobacco Use  . Smoking status: Current Every Day Smoker    Packs/day: 0.50    Years: 6.00    Pack years: 3.00    Types: E-cigarettes  . Smokeless tobacco: Never Used  Vaping Use  . Vaping Use: Never used  Substance Use Topics  . Alcohol use: Yes    Comment: Occasional  . Drug use: Never     Family Hx: The patient's family history includes Breast cancer in her maternal grandmother and mother; Heart attack in an other family member; Hyperlipidemia in her father; Hypertension in her mother; Leukemia in her paternal grandmother; Parkinson's disease in her paternal grandfather; Skin cancer in an other family member; Stroke in  an other family member.  ROS:   Please see the history of present illness.     All other systems reviewed and are negative.   Prior CV studies:   The following studies were reviewed today:    Labs/Other Tests and Data Reviewed:    EKG:  No ECG reviewed.  Recent Labs: 07/09/2019: TSH 0.79 08/21/2019: ALT 11; BUN 12; Creat 0.77; Potassium 4.2; Sodium 139   Recent Lipid Panel Lab Results  Component Value Date/Time   CHOL 179  08/21/2019 08:35 AM   TRIG 52 08/21/2019 08:35 AM   HDL 61 08/21/2019 08:35 AM   CHOLHDL 2.9 08/21/2019 08:35 AM   LDLCALC 105 (H) 08/21/2019 08:35 AM    Wt Readings from Last 3 Encounters:  03/25/20 175 lb (79.4 kg)  02/26/20 166 lb (75.3 kg)  08/22/19 143 lb (64.9 kg)     Risk Assessment/Calculations:      Objective:    Vital Signs:  Pulse 85   Ht 5\' 1"  (1.549 m)   Wt 175 lb (79.4 kg)   BMI 33.07 kg/m    VITAL SIGNS:  reviewed GEN:  no acute distress EYES:  sclerae anicteric, EOMI - Extraocular Movements Intact RESPIRATORY:  normal respiratory effort, symmetric expansion CARDIOVASCULAR:  no peripheral edema SKIN:  no rash, lesions or ulcers. MUSCULOSKELETAL:  no obvious deformities. NEURO:  alert and oriented x 3, no obvious focal deficit PSYCH:  normal affect She was in good spirits obviously pregnant no respiratory effort ASSESSMENT & PLAN:    1. I have seen her previously for idiopathic hypotension she is off midodrine and has not recurred during pregnancy.  She has mild tachycardia I will think she require suppressant treatment and I suspect she will do well through her third trimester and delivery.  If postoperative hypotension would need to resume her midodrine 5 to 10 mg 2-3 times a day.  You can contact heart care to see her if needed at the time of delivery.  She is reassured by the results of her monitor and I will plan to see her as needed and she said that she will contact me through my chart if there are any issues in the interim.        COVID-19 Education: The signs and symptoms of COVID-19 were discussed with the patient and how to seek care for testing (follow up with PCP or arrange E-visit).  The importance of social distancing was discussed today.  Time:   Today, I have spent 20 minutes with the patient with telehealth technology discussing the above problems.     Medication Adjustments/Labs and Tests Ordered: Current medicines are reviewed at  length with the patient today.  Concerns regarding medicines are outlined above.   Tests Ordered: No orders of the defined types were placed in this encounter.   Medication Changes: No orders of the defined types were placed in this encounter.   Follow Up:  As needed After delivery  Signed, , MD  03/25/2020 3:26 PM    [facility]

## 2020-03-25 NOTE — Patient Instructions (Signed)

## 2020-04-02 ENCOUNTER — Encounter (HOSPITAL_COMMUNITY): Payer: Self-pay | Admitting: Psychiatry

## 2020-04-02 ENCOUNTER — Telehealth (INDEPENDENT_AMBULATORY_CARE_PROVIDER_SITE_OTHER): Payer: No Typology Code available for payment source | Admitting: Psychiatry

## 2020-04-02 ENCOUNTER — Other Ambulatory Visit: Payer: Self-pay

## 2020-04-02 DIAGNOSIS — F3181 Bipolar II disorder: Secondary | ICD-10-CM

## 2020-04-02 DIAGNOSIS — F422 Mixed obsessional thoughts and acts: Secondary | ICD-10-CM | POA: Diagnosis not present

## 2020-04-02 NOTE — Progress Notes (Signed)
[place] MD/PA/NP OP Progress Note  Virtual Visit via Video Note  I connected with Vanessa Winters on 04/02/20 at  2:40 PM EST by a video enabled telemedicine application and verified that I am speaking with the correct person using two identifiers.  Location: Patient: Office Provider: Clinic   I discussed the limitations of evaluation and management by telemedicine and the availability of in person appointments. The patient expressed understanding and agreed to proceed.  I provided 18 minutes of non-face-to-face time during this encounter.     04/02/2020 2:44 PM Vanessa Winters  MRN:  [id number]  Chief Complaint:  " I am doing well, I feel sad sometimes."   HPI: Patient reported that she has been doing well.  She informed that her due date was adjusted to March 22.  She has been working from home for the past couple of months which has been going well so far.  She is glad that she is been working from home due to the surge in Covid pandemic cases. She informed that lately she has been anxious and worried about things that she knows are beyond her control.  She stated that a few days ago her husband who is a truck driver met an accident and since then she has been very worried about him.  She stated that she keeps thinking what if something was to happen to him and how she managed then. She also gave examples of how the other days she was worried about her trash cans getting knocked over and all the trash getting on the street.  She stated that this kept her up late at night because she was worried if this is to happen what will she do. She has been having intrusive thoughts of what will happen if it were to snow on the day of her delivery date.  She became tearful while talking about these intrusive thoughts.  Writer provided her with reassurance and told her that it was not totally unnatural to have such negative thoughts especially given the state she is in.  Writer advised her to just take 1  day at a time and focus on the positive things that are coming her way. Patient stated that she will be leaning toward starting Wellbutrin pretty soon after her delivery.  Writer informed her that we can touch base about a month after her delivery date in last week of April.  Patient was agreeable with this plan   Visit Diagnosis:    ICD-10-CM   1. Bipolar 2 disorder (HCC)  F31.81   2. Mixed obsessional thoughts and acts  F42.2     Past Psychiatric History:  OCD and anxiety  Past Medical History:  Past Medical History:  Diagnosis Date  . Colon polyps   . Discharge from right nipple 07/2016  . GERD (gastroesophageal reflux disease)   . Hyperlipidemia   . Migraines   . Seasonal allergies     Past Surgical History:  Procedure Laterality Date  . BREAST DUCTAL SYSTEM EXCISION Right 08/04/2016   Procedure: RIGHT NIPPLE DUCT EXCISION;  Surgeon: Donnie Mesa, MD;  Location: [place];  Service: General;  Laterality: Right;  . WISDOM TOOTH EXTRACTION      Family Psychiatric History:  Two sisters and mom diagnosed with bipolar disorder (unspecified). One sister taking Latuda, other sister and mom untreated.    Family History:  Family History  Problem Relation Age of Onset  . Hypertension Mother   . Breast cancer Mother   .  Hyperlipidemia Father   . Breast cancer Maternal Grandmother   . Leukemia Paternal Grandmother   . Parkinson's disease Paternal Grandfather   . Skin cancer Other        uncle  . Heart attack Other        great uncle  . Stroke Other        great uncle    Social History:  Social History   Socioeconomic History  . Marital status: Married    Spouse name: Not on file  . Number of children: Not on file  . Years of education: Not on file  . Highest education level: Not on file  Occupational History  . Not on file  Tobacco Use  . Smoking status: Current Every Day Smoker    Packs/day: 0.50    Years: 6.00    Pack years: 3.00    Types:  E-cigarettes  . Smokeless tobacco: Never Used  Vaping Use  . Vaping Use: Never used  Substance and Sexual Activity  . Alcohol use: Yes    Comment: Occasional  . Drug use: Never  . Sexual activity: Yes    Partners: Male    Birth control/protection: None    Comment: nuvaring  Other Topics Concern  . Not on file  Social History Narrative  . Not on file   Social Determinants of Health   Financial Resource Strain: Not on file  Food Insecurity: Not on file  Transportation Needs: Not on file  Physical Activity: Not on file  Stress: Not on file  Social Connections: Not on file    Allergies:  Allergies  Allergen Reactions  . Adhesive [Tape] Other (See Comments)    STERI-STRIPS:  BLISTERS  . Buspar [Buspirone]     Constipation.     Metabolic Disorder Labs: No results found for: HGBA1C, MPG Lab Results  Component Value Date   PROLACTIN 7.5 07/09/2019   PROLACTIN 10.4 07/18/2017   Lab Results  Component Value Date   CHOL 179 08/21/2019   TRIG 52 08/21/2019   HDL 61 08/21/2019   CHOLHDL 2.9 08/21/2019   VLDL 10 12/05/2014   LDLCALC 105 (H) 08/21/2019   LDLCALC 112 (H) 07/25/2018   Lab Results  Component Value Date   TSH 0.79 07/09/2019   TSH 1.64 07/25/2018    Therapeutic Level Labs: No results found for: LITHIUM No results found for: VALPROATE No components found for:  CBMZ  Current Medications: Current Outpatient Medications  Medication Sig Dispense Refill  . atorvastatin (LIPITOR) 20 MG tablet Take 1 tablet (20 mg total) by mouth at bedtime. (Patient not taking: No sig reported) 90 tablet 1  . azithromycin (ZITHROMAX Z-PAK) 250 MG tablet Take 2 tablets (500 mg) on  Day 1,  followed by 1 tablet (250 mg) once daily on Days 2 through 5. (Patient not taking: No sig reported) 6 tablet 0  . Galcanezumab-gnlm (EMGALITY) 120 MG/ML SOAJ Inject 1 pen into the skin every 30 (thirty) days. (Patient not taking: No sig reported) 1 pen 1  . midodrine (PROAMATINE) 5 MG  tablet Take 1 tablet (5 mg total) by mouth 3 (three) times daily with meals. (Patient not taking: No sig reported) 270 tablet 3  . ondansetron (ZOFRAN-ODT) 8 MG disintegrating tablet Take 1 tablet (8 mg total) by mouth every 8 (eight) hours as needed for nausea. (Patient not taking: No sig reported) 30 tablet 3  . rizatriptan (MAXALT) 10 MG tablet TAKE 1 TABLET (10 MG TOTAL) BY MOUTH AS NEEDED FOR  MIGRAINE. MAY REPEAT IN 2 HOURS IF NEEDED *NO MORE THAN 2 IN 24 HRS* (Patient not taking: No sig reported) 10 tablet 2   No current facility-administered medications for this visit.     Psychiatric Specialty Exam:   There were no vitals taken for this visit.There is no height or weight on file to calculate BMI.  General Appearance: Well Groomed  Eye Contact:  Good  Speech:  Normal Rate  Volume:  Normal  Mood:  Slightly anxious  Affect:  Congruent  Thought Process:  Coherent, Goal Directed and Linear  Orientation:  Full (Time, Place, and Person)  Thought Content: WDL and Logical   Suicidal Thoughts:  No  Homicidal Thoughts:  No  Memory:  Immediate;   Good Recent;   Good Remote;   Good  Judgement:  Good  Insight:  Good  Psychomotor Activity:  Normal  Concentration:  Concentration: Good and Attention Span: Good  Recall:  Good  Fund of Knowledge: Good  Language: Good  Akathisia:  No  Handed:  Right  AIMS (if indicated): not done visit telehealth  Assets:  Communication Skills Desire for Improvement Financial Resources/Insurance Housing  ADL's:  Intact  Cognition: WNL  Sleep:  Good   Screenings: GAD-7   Flowsheet Row Office Visit from 08/21/2019 in [place] Office Visit from 03/01/2019 in [place] Office Visit from 09/05/2018 in [place] from 07/19/2018 in [place]  Total GAD-7 Score '4 1 13 13    ' PHQ2-9   [place]  Office Visit from 08/21/2019 in [place] Office Visit from 03/01/2019 in [place] Office Visit from 09/05/2018 in [place] from 07/19/2018 in [place]  PHQ-2 Total Score '2 2 2 2  ' PHQ-9 Total Score '4 7 12 10       ' Assessment and Plan: Patient reported increasing her intrusive thoughts over the last few weeks.  She is in the third trimester of her pregnancy.  She is managing fairly well without her medications.  She stated that she would like to restart Wellbutrin about a month after her due date.  Writer informed her that we can touch base about a month after her due date and see how she is doing and decide at that time.   1. Bipolar 2 disorder ([place])   2. Mixed obsessional thoughts and acts   Continue to manage with out medications during the pregnant state. Follow up in 10-11 weeks.    [place] Crane, MD 04/02/2020, 2:44 PM

## 2020-04-09 LAB — OB RESULTS CONSOLE GBS: GBS: POSITIVE

## 2020-05-04 ENCOUNTER — Encounter (HOSPITAL_COMMUNITY): Payer: Self-pay | Admitting: *Deleted

## 2020-05-04 ENCOUNTER — Telehealth (HOSPITAL_COMMUNITY): Payer: Self-pay | Admitting: *Deleted

## 2020-05-04 NOTE — Telephone Encounter (Signed)
Preadmission screen  

## 2020-05-05 ENCOUNTER — Telehealth (HOSPITAL_COMMUNITY): Payer: Self-pay | Admitting: *Deleted

## 2020-05-05 ENCOUNTER — Other Ambulatory Visit: Payer: Self-pay | Admitting: Obstetrics and Gynecology

## 2020-05-05 NOTE — Telephone Encounter (Signed)
Preadmission screen  

## 2020-05-11 ENCOUNTER — Other Ambulatory Visit (HOSPITAL_COMMUNITY)
Admission: RE | Admit: 2020-05-11 | Discharge: 2020-05-11 | Disposition: A | Discharge status: Home / Self Care | Payer: No Typology Code available for payment source | Source: Ambulatory Visit | Attending: Obstetrics and Gynecology | Admitting: Obstetrics and Gynecology

## 2020-05-11 DIAGNOSIS — Z01812 Encounter for preprocedural laboratory examination: Secondary | ICD-10-CM | POA: Insufficient documentation

## 2020-05-11 DIAGNOSIS — Z20822 Contact with and (suspected) exposure to covid-19: Secondary | ICD-10-CM | POA: Insufficient documentation

## 2020-05-12 LAB — SARS CORONAVIRUS 2 (TAT 6-24 HRS): SARS Coronavirus 2: NEGATIVE

## 2020-05-13 ENCOUNTER — Other Ambulatory Visit: Payer: Self-pay

## 2020-05-13 ENCOUNTER — Inpatient Hospital Stay (HOSPITAL_COMMUNITY): Payer: No Typology Code available for payment source | Admitting: Anesthesiology

## 2020-05-13 ENCOUNTER — Inpatient Hospital Stay (HOSPITAL_COMMUNITY): Payer: No Typology Code available for payment source

## 2020-05-13 ENCOUNTER — Encounter (HOSPITAL_COMMUNITY): Payer: Self-pay | Admitting: Obstetrics and Gynecology

## 2020-05-13 ENCOUNTER — Inpatient Hospital Stay (HOSPITAL_COMMUNITY)
Admission: AD | Admit: 2020-05-13 | Discharge: 2020-05-15 | DRG: 807 | Disposition: A | Discharge status: Home / Self Care | Payer: No Typology Code available for payment source | Attending: Obstetrics and Gynecology | Admitting: Obstetrics and Gynecology

## 2020-05-13 DIAGNOSIS — F419 Anxiety disorder, unspecified: Secondary | ICD-10-CM | POA: Diagnosis present

## 2020-05-13 DIAGNOSIS — Z3A40 40 weeks gestation of pregnancy: Secondary | ICD-10-CM

## 2020-05-13 DIAGNOSIS — Z87891 Personal history of nicotine dependence: Secondary | ICD-10-CM | POA: Diagnosis not present

## 2020-05-13 DIAGNOSIS — F902 Attention-deficit hyperactivity disorder, combined type: Secondary | ICD-10-CM | POA: Diagnosis present

## 2020-05-13 DIAGNOSIS — O99824 Streptococcus B carrier state complicating childbirth: Secondary | ICD-10-CM | POA: Diagnosis present

## 2020-05-13 DIAGNOSIS — O480 Post-term pregnancy: Principal | ICD-10-CM | POA: Diagnosis present

## 2020-05-13 DIAGNOSIS — F3181 Bipolar II disorder: Secondary | ICD-10-CM | POA: Diagnosis present

## 2020-05-13 DIAGNOSIS — Z3490 Encounter for supervision of normal pregnancy, unspecified, unspecified trimester: Secondary | ICD-10-CM | POA: Diagnosis present

## 2020-05-13 DIAGNOSIS — Z20822 Contact with and (suspected) exposure to covid-19: Secondary | ICD-10-CM | POA: Diagnosis present

## 2020-05-13 LAB — CBC
HCT: 34.9 % — ABNORMAL LOW (ref 36.0–46.0)
Hemoglobin: 12.1 g/dL (ref 12.0–15.0)
MCH: 32.7 pg (ref 26.0–34.0)
MCHC: 34.7 g/dL (ref 30.0–36.0)
MCV: 94.3 fL (ref 80.0–100.0)
Platelets: 212 10*3/uL (ref 150–400)
RBC: 3.7 MIL/uL — ABNORMAL LOW (ref 3.87–5.11)
RDW: 12 % (ref 11.5–15.5)
WBC: 9.6 10*3/uL (ref 4.0–10.5)
nRBC: 0 % (ref 0.0–0.2)

## 2020-05-13 LAB — TYPE AND SCREEN
ABO/RH(D): O POS
Antibody Screen: NEGATIVE

## 2020-05-13 LAB — RPR: RPR Ser Ql: NONREACTIVE

## 2020-05-13 MED ORDER — TERBUTALINE SULFATE 1 MG/ML IJ SOLN: 0.2500 mg | Freq: Once | INTRAMUSCULAR | Status: DC | PRN

## 2020-05-13 MED ORDER — OXYTOCIN BOLUS FROM INFUSION: 333.0000 mL | Freq: Once | INTRAVENOUS | Status: DC

## 2020-05-13 MED ORDER — FENTANYL-BUPIVACAINE-NACL 0.5-0.125-0.9 MG/250ML-% EP SOLN
EPIDURAL | Status: AC
  Filled 2020-05-13: qty 250

## 2020-05-13 MED ORDER — ZOLPIDEM TARTRATE 5 MG PO TABS: 5.0000 mg | ORAL_TABLET | Freq: Every evening | ORAL | Status: DC | PRN

## 2020-05-13 MED ORDER — ONDANSETRON HCL 4 MG/2ML IJ SOLN: 4.0000 mg | INTRAMUSCULAR | Status: DC | PRN

## 2020-05-13 MED ORDER — EPHEDRINE 5 MG/ML INJ: 10.0000 mg | INTRAVENOUS | Status: DC | PRN

## 2020-05-13 MED ORDER — DIBUCAINE (PERIANAL) 1 % EX OINT
1.0000 "application " | TOPICAL_OINTMENT | CUTANEOUS | Status: DC | PRN
  Administered 2020-05-14: 1 via RECTAL
  Filled 2020-05-13: qty 28

## 2020-05-13 MED ORDER — BENZOCAINE-MENTHOL 20-0.5 % EX AERO
1.0000 "application " | INHALATION_SPRAY | CUTANEOUS | Status: DC | PRN
  Administered 2020-05-13: 1 via TOPICAL
  Filled 2020-05-13: qty 56

## 2020-05-13 MED ORDER — FENTANYL-BUPIVACAINE-NACL 0.5-0.125-0.9 MG/250ML-% EP SOLN
12.0000 mL/h | EPIDURAL | Status: DC | PRN
  Administered 2020-05-13: 12 mL/h via EPIDURAL

## 2020-05-13 MED ORDER — PHENYLEPHRINE 40 MCG/ML (10ML) SYRINGE FOR IV PUSH (FOR BLOOD PRESSURE SUPPORT)
80.0000 ug | PREFILLED_SYRINGE | INTRAVENOUS | Status: DC | PRN
  Filled 2020-05-13: qty 10

## 2020-05-13 MED ORDER — MISOPROSTOL 25 MCG QUARTER TABLET
25.0000 ug | ORAL_TABLET | ORAL | Status: DC | PRN
  Administered 2020-05-13 (×2): 25 ug via VAGINAL
  Filled 2020-05-13 (×2): qty 1

## 2020-05-13 MED ORDER — OXYTOCIN-SODIUM CHLORIDE 30-0.9 UT/500ML-% IV SOLN
2.5000 [IU]/h | INTRAVENOUS | Status: DC
  Filled 2020-05-13: qty 500

## 2020-05-13 MED ORDER — ACETAMINOPHEN 325 MG PO TABS
650.0000 mg | ORAL_TABLET | ORAL | Status: DC | PRN
  Administered 2020-05-14: 650 mg via ORAL
  Filled 2020-05-13: qty 2

## 2020-05-13 MED ORDER — SIMETHICONE 80 MG PO CHEW: 80.0000 mg | CHEWABLE_TABLET | ORAL | Status: DC | PRN

## 2020-05-13 MED ORDER — SODIUM CHLORIDE 0.9 % IV SOLN
5.0000 10*6.[IU] | Freq: Once | INTRAVENOUS | Status: AC
  Administered 2020-05-13: 5 10*6.[IU] via INTRAVENOUS
  Filled 2020-05-13: qty 5

## 2020-05-13 MED ORDER — LACTATED RINGERS IV SOLN: INTRAVENOUS | Status: DC

## 2020-05-13 MED ORDER — IBUPROFEN 600 MG PO TABS
600.0000 mg | ORAL_TABLET | Freq: Four times a day (QID) | ORAL | Status: DC
  Administered 2020-05-13 – 2020-05-15 (×8): 600 mg via ORAL
  Filled 2020-05-13 (×8): qty 1

## 2020-05-13 MED ORDER — OXYTOCIN-SODIUM CHLORIDE 30-0.9 UT/500ML-% IV SOLN
1.0000 m[IU]/min | INTRAVENOUS | Status: DC
  Administered 2020-05-13: 2 m[IU]/min via INTRAVENOUS
  Filled 2020-05-13: qty 500

## 2020-05-13 MED ORDER — METHYLERGONOVINE MALEATE 0.2 MG/ML IJ SOLN: 0.2000 mg | INTRAMUSCULAR | Status: DC | PRN

## 2020-05-13 MED ORDER — METHYLERGONOVINE MALEATE 0.2 MG PO TABS: 0.2000 mg | ORAL_TABLET | ORAL | Status: DC | PRN

## 2020-05-13 MED ORDER — LIDOCAINE HCL (PF) 1 % IJ SOLN: 30.0000 mL | INTRAMUSCULAR | Status: DC | PRN

## 2020-05-13 MED ORDER — SOD CITRATE-CITRIC ACID 500-334 MG/5ML PO SOLN: 30.0000 mL | ORAL | Status: DC | PRN

## 2020-05-13 MED ORDER — LACTATED RINGERS IV SOLN: 500.0000 mL | INTRAVENOUS | Status: DC | PRN

## 2020-05-13 MED ORDER — PHENYLEPHRINE 40 MCG/ML (10ML) SYRINGE FOR IV PUSH (FOR BLOOD PRESSURE SUPPORT)
80.0000 ug | PREFILLED_SYRINGE | INTRAVENOUS | Status: DC | PRN
  Administered 2020-05-13 (×2): 80 ug via INTRAVENOUS

## 2020-05-13 MED ORDER — DIPHENHYDRAMINE HCL 50 MG/ML IJ SOLN: 12.5000 mg | INTRAMUSCULAR | Status: DC | PRN

## 2020-05-13 MED ORDER — LIDOCAINE HCL (PF) 1 % IJ SOLN
INTRAMUSCULAR | Status: DC | PRN
  Administered 2020-05-13: 10 mL via EPIDURAL

## 2020-05-13 MED ORDER — LACTATED RINGERS IV SOLN
500.0000 mL | Freq: Once | INTRAVENOUS | Status: AC
  Administered 2020-05-13: 500 mL via INTRAVENOUS

## 2020-05-13 MED ORDER — SENNOSIDES-DOCUSATE SODIUM 8.6-50 MG PO TABS
2.0000 | ORAL_TABLET | Freq: Every day | ORAL | Status: DC
  Administered 2020-05-14 – 2020-05-15 (×2): 2 via ORAL
  Filled 2020-05-13 (×2): qty 2

## 2020-05-13 MED ORDER — ONDANSETRON HCL 4 MG/2ML IJ SOLN: 4.0000 mg | Freq: Four times a day (QID) | INTRAMUSCULAR | Status: DC | PRN

## 2020-05-13 MED ORDER — PRENATAL MULTIVITAMIN CH
1.0000 | ORAL_TABLET | Freq: Every day | ORAL | Status: DC
  Administered 2020-05-14 – 2020-05-15 (×2): 1 via ORAL
  Filled 2020-05-13 (×2): qty 1

## 2020-05-13 MED ORDER — TETANUS-DIPHTH-ACELL PERTUSSIS 5-2.5-18.5 LF-MCG/0.5 IM SUSY: 0.5000 mL | PREFILLED_SYRINGE | Freq: Once | INTRAMUSCULAR | Status: DC

## 2020-05-13 MED ORDER — WITCH HAZEL-GLYCERIN EX PADS
1.0000 "application " | MEDICATED_PAD | CUTANEOUS | Status: DC | PRN
  Administered 2020-05-14: 1 via TOPICAL

## 2020-05-13 MED ORDER — PENICILLIN G POT IN DEXTROSE 60000 UNIT/ML IV SOLN
3.0000 10*6.[IU] | INTRAVENOUS | Status: DC
  Administered 2020-05-13 (×3): 3 10*6.[IU] via INTRAVENOUS
  Filled 2020-05-13 (×3): qty 50

## 2020-05-13 MED ORDER — ACETAMINOPHEN 325 MG PO TABS
650.0000 mg | ORAL_TABLET | ORAL | Status: DC | PRN
  Administered 2020-05-13: 650 mg via ORAL
  Filled 2020-05-13: qty 2

## 2020-05-13 MED ORDER — DIPHENHYDRAMINE HCL 25 MG PO CAPS: 25.0000 mg | ORAL_CAPSULE | Freq: Four times a day (QID) | ORAL | Status: DC | PRN

## 2020-05-13 MED ORDER — OXYCODONE-ACETAMINOPHEN 5-325 MG PO TABS: 1.0000 | ORAL_TABLET | ORAL | Status: DC | PRN

## 2020-05-13 MED ORDER — ONDANSETRON HCL 4 MG PO TABS: 4.0000 mg | ORAL_TABLET | ORAL | Status: DC | PRN

## 2020-05-13 MED ORDER — LACTATED RINGERS AMNIOINFUSION: INTRAVENOUS | Status: DC

## 2020-05-13 MED ORDER — OXYCODONE-ACETAMINOPHEN 5-325 MG PO TABS: 2.0000 | ORAL_TABLET | ORAL | Status: DC | PRN

## 2020-05-13 MED ORDER — COCONUT OIL OIL: 1.0000 "application " | TOPICAL_OIL | Status: DC | PRN

## 2020-05-13 NOTE — Progress Notes (Signed)
S:  At Vanessa Winters at MD request to evaluate EFM and place IUPC for amnioinfusion.  Patient comfortable with epidural , spouse at [facility] and supportive.  IUPC discussed and r/b reviewed, patient agrees.   O: BP (!) 100/51   Pulse (!) 145   Temp 98.2 F (36.8 C) (Oral)   Resp 20   LMP 05/12/2019 (Exact Date)    FHR 140, deep variables to 60 bpm repetitive despite position changes.  Ctx q 2-4 min, mild/mod to palp. Pit 2 mu/min  SVE 3/90/-2 Bloody amniotic fluid noted IUPC placed w/ ease More bloody fluid then bright red blood trickling noted approx 50 cc blood loss per vagina  A/P: IUP IOL at term S/P miso x 2 overnight for ripening and AROM this AM FHT cat 2 Vaginal bleeding, not clear source cervical or placental IUPC placed and amnioinfusion going in.  Epidural effective GBS prophylaxis w/ PCN ongoing  FHT with some late deep decels after amnio started then improved to occasional late shallow decels Will continue to monitor closely, Pitocin off for now.  Dr. Billy Coast updated, POC in consult w/ MD.   Vanessa Mends, MSN, CNM 05/13/2020, 10:04 AM

## 2020-05-13 NOTE — Lactation Note (Addendum)
This note was copied from a baby's chart. Lactation Consultation Note  Patient Name: Vanessa Winters Date: 05/13/2020 Reason for consult: Initial assessment;Mother's request;Difficult latch;Primapara;1st time breastfeeding;Term;Other (Comment) (Breast ductal incision on the right) Age:25   Mom's nipples are short shafted. Infant not able to sustain the latch even with tea cup hold. LC showed Mom how to offer colostrum via spoon or finger feeding 82ml given.  Infant showing cues, Mom really wanted to latch. LC provided 20 NS and infant able to latch. LC informed Mom using NS to train infant to open her mouth wider.   LC reviewed feeding plan with RN, Alana Mazyck. If Mom continues to use the NS, RN will assist setting her up on DEBP. Mom is aware of the plan.  Plan 1. To feed based on cues 8-12x in 24 hr period no more than 4 hrs without an attempt, STS and offer both breasts. Try latching at breast first and the if needed use NS size 20.          2. Manual pump to pre pump 5-10 minute to bring out her nipples         3. I and O sheet reviewed          4. [facility] brochure of inpatient and outpatient services reviewed.  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  All questions answered at the end of the visit.   Maternal Data Has patient been taught Hand Expression?: Yes Does the patient have breastfeeding experience prior to this delivery?: No  Feeding Mother's Current Feeding Choice: Breast Milk  LATCH Score Latch: Repeated attempts needed to sustain latch, nipple held in mouth throughout feeding, stimulation needed to elicit sucking reflex.  Audible Swallowing: A few with stimulation  Type of Nipple: Flat  Comfort (Breast/Nipple): Soft / non-tender  Hold (Positioning): Assistance needed to correctly position infant at breast and maintain latch.  LATCH Score: 6   Lactation Tools Discussed/Used Tools: Flanges;Pump;Nipple Shields Nipple shield size: 20 Flange  Size: 24 Breast pump type: Manual Pump Education: Setup, frequency, and cleaning;Milk Storage Reason for Pumping: Nipples are flat and short shafted Pumping frequency: pre pump 5-10 minutes before latching  Interventions Interventions: Breast feeding basics reviewed;Support pillows;Education;Assisted with latch;Position options;Skin to skin;Expressed milk;Breast massage;Hand express;Pre-pump if needed;Hand pump;Adjust position;Breast compression  Discharge Pump: Manual;Employee Pump WIC Program: No  Consult Status Consult Status: Follow-up Date: 05/14/20 Follow-up type: In-patient    Kelda Azad  Nicholson-Springer 05/13/2020, 11:21 PM

## 2020-05-13 NOTE — H&P (Signed)
Vanessa Winters is a 25 y.o. female presenting for postdates IOL. OB History    Gravida  1   Para      Term      Preterm      AB      Living        SAB      IAB      Ectopic      Multiple      Live Births             Past Medical History:  Diagnosis Date  . Colon polyps   . Discharge from right nipple 07/2016  . GERD (gastroesophageal reflux disease)   . Hyperlipidemia   . Migraines   . Seasonal allergies    Past Surgical History:  Procedure Laterality Date  . BREAST DUCTAL SYSTEM EXCISION Right 08/04/2016   Procedure: RIGHT NIPPLE DUCT EXCISION;  Surgeon: Manus Rudd, MD;  Location: [facility];  Service: General;  Laterality: Right;  . WISDOM TOOTH EXTRACTION     Family History: family history includes Breast cancer in her maternal grandmother and mother; Heart attack in an other family member; Hyperlipidemia in her father; Hypertension in her mother; Leukemia in her paternal grandmother; Parkinson's disease in her paternal grandfather; Skin cancer in an other family member; Stroke in an other family member. Social History:  reports that she quit smoking about a year ago. Her smoking use included e-cigarettes. She has a 3.00 pack-year smoking history. She has never used smokeless tobacco. She reports current alcohol use. She reports that she does not use drugs.     Maternal Diabetes: No Genetic Screening: Normal Maternal Ultrasounds/Referrals: Normal Fetal Ultrasounds or other Referrals:  None Maternal Substance Abuse:  No Significant Maternal Medications:  None Significant Maternal Lab Results:  Group B Strep negative Other Comments:  None  Review of Systems  Constitutional: Negative.   All other systems reviewed and are negative.  Maternal Medical History:  Reason for admission: Contractions.   Contractions: Frequency: rare.   Perceived severity is mild.    Fetal activity: Perceived fetal activity is normal.   Last perceived fetal  movement was within the past hour.    Prenatal complications: no prenatal complications Prenatal Complications - Diabetes: none.    Dilation: 2 Effacement (%): 50 Station: -3 Exam by:: E Chipps RN Blood pressure 116/76, pulse 77, temperature 98 F (36.7 C), temperature source Oral, resp. rate 17, last menstrual period 05/12/2019. Maternal Exam:  Uterine Assessment: Contraction strength is mild.  Contraction frequency is rare.   Abdomen: Patient reports no abdominal tenderness. Fetal presentation: vertex  Introitus: Normal vulva. Normal vagina.  Ferning test: not done.  Nitrazine test: not done. Amniotic fluid character: not assessed.  Pelvis: questionable for delivery.   Cervix: Cervix evaluated by digital exam.     Physical Exam Vitals and nursing note reviewed.  Constitutional:      Appearance: Normal appearance. She is normal weight.  HENT:     Head: Normocephalic and atraumatic.  Cardiovascular:     Rate and Rhythm: Normal rate and regular rhythm.     Pulses: Normal pulses.     Heart sounds: Normal heart sounds.  Pulmonary:     Effort: Pulmonary effort is normal.     Breath sounds: Normal breath sounds.  Abdominal:     Palpations: Abdomen is soft.  Genitourinary:    General: Normal vulva.  Musculoskeletal:        General: Normal range of motion.  Cervical back: Normal range of motion and neck supple.  Skin:    General: Skin is warm and dry.  Neurological:     General: No focal deficit present.     Mental Status: She is alert and oriented to person, place, and time.  Psychiatric:        Mood and Affect: Mood normal.        Behavior: Behavior normal.     Prenatal labs: ABO, Rh: --/--/O POS (03/23 0030) Antibody: NEG (03/23 0030) Rubella: Immune (08/26 0000) RPR: Nonreactive (08/26 0000)  HBsAg: Negative (08/26 0000)  HIV: Non-reactive (08/26 0000)  GBS: Positive/-- (02/17 0000)   Assessment/Plan: Postdates IOL cytotec Pitocin in  AM   Veena Sturgess J 05/13/2020, 6:41 AM

## 2020-05-13 NOTE — Anesthesia Procedure Notes (Signed)
Epidural Patient location during procedure: OB Start time: 05/13/2020 8:40 AM End time: 05/13/2020 8:50 AM  Staffing Anesthesiologist: Mellody Dance, MD Performed: anesthesiologist   Preanesthetic Checklist Completed: patient identified, IV checked, site marked, risks and benefits discussed, monitors and equipment checked, pre-op evaluation and timeout performed  Epidural Patient position: sitting Prep: DuraPrep Patient monitoring: heart rate, cardiac monitor, continuous pulse ox and blood pressure Approach: midline Location: L3-L4 Injection technique: LOR saline  Needle:  Needle type: Tuohy  Needle gauge: 17 G Needle length: 9 cm Needle insertion depth: 6 cm Catheter type: closed end flexible Catheter size: 20 Guage Catheter at skin depth: 11 cm Test dose: negative and Other  Assessment Events: blood not aspirated, injection not painful, no injection resistance and negative IV test  Additional Notes Informed consent obtained prior to proceeding including risk of failure, 1% risk of PDPH, risk of minor discomfort and bruising.  Discussed rare but serious complications including epidural abscess, permanent nerve injury, epidural hematoma.  Discussed alternatives to epidural analgesia and patient desires to proceed.  Timeout performed pre-procedure verifying patient name, procedure, and platelet count.  Patient tolerated procedure well.

## 2020-05-13 NOTE — Lactation Note (Signed)
This note was copied from a baby's chart. Lactation Consultation Note  Patient Name: Vanessa Winters [id number] Date: 05/13/2020 Reason for consult: L&D Initial assessment Age:27 hours  L&D consult with 34 minutes old infant and P1 mother. Parents are present at time of consult. Congratulated them on their newborn. Infant is latched modified cradle position to left breast. Discussed STS as ideal transition for infants after birth helping with temperature, blood sugar and comfort. Talked about primal reflexes such as rooting, hands to mouth, searching for the breast among others.   No latch or hand expression assistance at this time. Explained LC services availability during postpartum stay. Thanked family for their time.    Maternal Data Does the patient have breastfeeding experience prior to this delivery?: No  Feeding Mother's Current Feeding Choice: Breast Milk  LATCH Score Latch: Grasps breast easily, tongue down, lips flanged, rhythmical sucking.  Audible Swallowing: Spontaneous and intermittent  Type of Nipple: Everted at rest and after stimulation  Comfort (Breast/Nipple): Soft / non-tender  Hold (Positioning): Assistance needed to correctly position infant at breast and maintain latch.  LATCH Score: 9   Interventions Interventions: Breast feeding basics reviewed;Skin to skin;Expressed milk;Education  Consult Status Date: 05/13/20 Follow-up type: In-patient    Nicie Milan A Higuera Ancidey 05/13/2020, 5:23 PM

## 2020-05-13 NOTE — Anesthesia Preprocedure Evaluation (Addendum)
Anesthesia Evaluation  Patient identified by MRN, date of birth, ID band Patient awake    Reviewed: Allergy & Precautions, NPO status , Patient's Chart, lab work & pertinent test results  Airway Mallampati: II  TM Distance: >3 FB Neck ROM: Full    Dental no notable dental hx.    Pulmonary neg pulmonary ROS, Patient abstained from smoking., former smoker,    Pulmonary exam normal breath sounds clear to auscultation       Cardiovascular Normal cardiovascular exam Rhythm:Regular Rate:Normal  Symptomatic hypotension with reflex tachycardia previously on midodrine prior to becoming pregnant. Sees Cardiology regularly.   Neuro/Psych  Headaches, PSYCHIATRIC DISORDERS Anxiety Bipolar Disorder OCD   GI/Hepatic Neg liver ROS, GERD  ,  Endo/Other  negative endocrine ROS  Renal/GU negative Renal ROS  negative genitourinary   Musculoskeletal  (+) Arthritis ,   Abdominal   Peds negative pediatric ROS (+)  Hematology negative hematology ROS (+)   Anesthesia Other Findings   Reproductive/Obstetrics (+) Pregnancy                            Anesthesia Physical Anesthesia Plan  ASA: II  Anesthesia Plan: Epidural   Post-op Pain Management:    Induction:   PONV Risk Score and Plan: 2 and Treatment may vary due to age or medical condition  Airway Management Planned: Natural Airway  Additional Equipment:   Intra-op Plan:   Post-operative Plan:   Informed Consent: I have reviewed the patients History and Physical, chart, labs and discussed the procedure including the risks, benefits and alternatives for the proposed anesthesia with the patient or authorized representative who has indicated his/her understanding and acceptance.       Plan Discussed with: Anesthesiologist  Anesthesia Plan Comments:         Anesthesia Quick Evaluation

## 2020-05-13 NOTE — Progress Notes (Signed)
Vanessa Winters is a 25 y.o. G1P0 at [redacted]w[redacted]d by LMP admitted for induction of labor due to postterm.  Subjective: crampy  Objective: BP 97/60   Pulse 74   Temp 98 F (36.7 C) (Oral)   Resp 16   LMP 05/12/2019 (Exact Date)  No intake/output data recorded. No intake/output data recorded.  FHT:  FHR: 145 bpm, variability: moderate,  accelerations:  Present,  decelerations:  Absent and   UC:   irregular, every 3-7 minutes SVE:   2/70/-1 arom clear  Labs: Lab Results  Component Value Date   WBC 9.6 05/13/2020   HGB 12.1 05/13/2020   HCT 34.9 (L) 05/13/2020   MCV 94.3 05/13/2020   PLT 212 05/13/2020    Assessment / Plan: Induction of labor due to postterm,  progressing well on pitocin  Labor: Progressing normally Preeclampsia:  no signs or symptoms of toxicity Fetal Wellbeing:  Category I Pain Control:  Labor support without medications I/D:  n/a Anticipated MOD:  NSVD  [name] 05/13/2020, 8:09 AM

## 2020-05-14 LAB — CBC
HCT: 31 % — ABNORMAL LOW (ref 36.0–46.0)
Hemoglobin: 10.5 g/dL — ABNORMAL LOW (ref 12.0–15.0)
MCH: 32.9 pg (ref 26.0–34.0)
MCHC: 33.9 g/dL (ref 30.0–36.0)
MCV: 97.2 fL (ref 80.0–100.0)
Platelets: 193 10*3/uL (ref 150–400)
RBC: 3.19 MIL/uL — ABNORMAL LOW (ref 3.87–5.11)
RDW: 12.1 % (ref 11.5–15.5)
WBC: 14.3 10*3/uL — ABNORMAL HIGH (ref 4.0–10.5)
nRBC: 0 % (ref 0.0–0.2)

## 2020-05-14 NOTE — Anesthesia Postprocedure Evaluation (Signed)
Anesthesia Post Note  Patient: Vanessa Winters  Procedure(s) Performed: AN AD HOC LABOR EPIDURAL     Patient location during evaluation: Mother Baby Anesthesia Type: Epidural Level of consciousness: awake, awake and alert and oriented Pain management: pain level controlled Vital Signs Assessment: post-procedure vital signs reviewed and stable Respiratory status: spontaneous breathing and respiratory function stable Cardiovascular status: blood pressure returned to baseline Postop Assessment: no headache, epidural receding, patient able to bend at knees, adequate PO intake, no backache, no apparent nausea or vomiting and able to ambulate Anesthetic complications: no   No complications documented.  Last Vitals:  Vitals:   05/14/20 0010 05/14/20 0727  BP: 105/71 107/76  Pulse: 77 83  Resp: 18 16  Temp: 36.7 C 37 C  SpO2: 100% 98%    Last Pain:  Vitals:   05/14/20 0727  TempSrc: Oral  PainSc: 2    Pain Goal: Patients Stated Pain Goal: 3 (05/14/20 0500)              Epidural/Spinal Function Cutaneous sensation: Normal sensation (05/14/20 0727), Patient able to flex knees: Yes (05/14/20 0727), Patient able to lift hips off bed: Yes (05/14/20 0727), Back pain beyond tenderness at insertion site: No (05/14/20 0727), Progressively worsening motor and/or sensory loss: No (05/14/20 0727), Bowel and/or bladder incontinence post epidural: No (05/14/20 0727)  Cleda Clarks

## 2020-05-14 NOTE — Clinical Social Work Maternal (Addendum)
CLINICAL SOCIAL WORK MATERNAL/CHILD NOTE  Patient Details  Name: Vanessa Winters MRN: [id number] Date of Birth: 03/25/1995  Date:  05/14/2020  Clinical Social Worker Initiating Note:  Nicole Sinclair, LCSW Date/Time: Initiated:  05/14/20/1121     Child's Name:  Sawyer Poliquin   Biological Parents:  Father,Mother   Need for Interpreter:  None   Reason for Referral:  Behavioral Health Concerns   Address:  [address] [place] [address]  Physical Address: [address] [place], [place] [address]   Phone number:  [phone] (home)     Additional phone number:  Household Members/Support Persons (HM/SP):   Household Member/Support Person 1   HM/SP Name Relationship DOB or Age  HM/SP -1 Alec Harwick Spouse 12-23-1992  HM/SP -2        HM/SP -3        HM/SP -4        HM/SP -5        HM/SP -6        HM/SP -7        HM/SP -8          Natural Supports (not living in the home):  Extended Family   Professional Supports: Other (Comment) (Psychiatrist)   Employment: Full-time   Type of Work: [place]   Education:  College graduate   Homebound arranged:    Financial Resources:  Private Insurance   Other Resources:      Cultural/Religious Considerations Which May Impact Care:  None  Strengths:  Ability to meet basic needs ,Home prepared for child ,Pediatrician chosen   Psychotropic Medications:         Pediatrician:    [place] area  Pediatrician List:   [facility]  [place]    [place]    [place]    [place]    [place]      Pediatrician Fax Number:    Risk Factors/Current Problems:  Mental Health Concerns    Cognitive State:  Insightful ,Linear Thinking ,Alert    Mood/Affect:  Calm    CSW Assessment:  CSW received consult for hx of mental health, bipolar, OCD and depression. CSW met with MOB to offer support and complete assessment.    CSW met with MOB at bedside. CSW  explain role. MOB receptive to CSW visit. CSW observed MOB siting up in the beds, infant sleeping in the bassinet next to the beds. FOB (Alec Carothers [birth year]23-1992) was sleeping as well. CSW congratulated MOB. CSW explain [facility] privacy policy and offered to return at a different time.  MOB agreeable for CSW to continue the assessment.  CSW explain the reason for the visit. CSW asked how MOB has been feeling since giving birth. MOB reports, "I am good." MOB reports FOB is tired because he was up most of the night. CSW asked MOB how her pregnancy was. MOB reports, "As good as pregnancy can be." MOB reports she has a mental health history of depression, OCD and bipolar. MOB reports she did not have any concerns with her mental health while pregnant. MOB reports she was taking the medication Wellbutrin and Lamictal however once learning she was pregnant, she stopped taking the medications. MOB reports she will eventually restart the medications. MOB reports she is active [facility], she sees the psychiatrist Dr. Kaur regularly via telehealth visits. MOB reports she an appointment scheduled with the psychiatrist in the next couple of weeks. MOB denies any thoughts of suicidal   and homicide currently. CSW provided education regarding the baby blues period vs. perinatal mood disorders, discussed treatment and gave additional resources. MOB receptive to the resources. CSW recommended MOB complete a self-evaluation during the postpartum time period using the New Mom Checklist from Postpartum Progress and encouraged MOB to contact a medical professional if symptoms are noted at any time.  CSW provided review of Sudden Infant Death Syndrome (SIDS) precautions and no co-sleeping with the infant. MOB reports understanding. CSW asked MOB about items for the infant. MOB reports she has all items for the infant including a bassinet and car seat. MOB reports she has chosen [employer]. MOB reports no  issues with transportation. CSW assessed for additional needs.   CSW identifies no further need for intervention and no barriers to discharge at this time.    CSW Plan/Description:  No Further Intervention Required/No Barriers to Discharge,Perinatal Mood and Anxiety Disorder (PMADs) Education,Psychosocial Support and Ongoing Assessment of Needs,Sudden Infant Death Syndrome (SIDS) Education    Lia Hopping, LCSW 05/14/2020, 11:25 AM

## 2020-05-14 NOTE — Progress Notes (Signed)
PPD # 1 S/P NSVD  Live born female  Birth Weight: 7 lb 0.7 oz (3195 g) APGAR: 8, 9  Newborn Delivery   Birth date/time: 05/13/2020 16:27:00 Delivery type: Vaginal, Spontaneous     Baby name: Vanessa Winters Delivering provider: Olivia Mackie  Episiotomy:None   Lacerations:2nd degree;Perineal   Feeding: breast  Pain control at delivery: Epidural   S:  Reports feeling well.              Tolerating po/ No nausea or vomiting             Bleeding is light             Pain controlled with ibuprofen (OTC)             Up ad lib / ambulatory / voiding without difficulties   O:  A & O x 3, in no apparent distress              VS:  Vitals:   05/13/20 1824 05/13/20 1931 05/14/20 0010 05/14/20 0727  BP: 95/62 106/67 105/71 107/76  Pulse: 65 75 77 83  Resp: 18 18 18 16   Temp: 98.1 F (36.7 C) 97.7 F (36.5 C) 98 F (36.7 C) 98.6 F (37 C)  TempSrc: Oral Oral Oral Oral  SpO2: 100% 96% 100% 98%    LABS:  Recent Labs    05/13/20 0030  WBC 9.6  HGB 12.1  HCT 34.9*  PLT 212    Blood type: --/--/O POS (03/23 0030)  Rubella: Immune (08/26 0000)   I&O: I/O last 3 completed shifts: In: 0  Out: 1500 [Urine:1300; Blood:200]          No intake/output data recorded.  Vaccines: TDaP          UTD         Flu             UTD                    COVID-19 UTD  Gen: AAO x 3, NAD  Abdomen: soft, non-tender, non-distended             Fundus: firm, non-tender, U-1  Perineum: repair intact, no edema   Lochia: small  Extremities: no edema, no calf pain or tenderness    A/P: PPD # 1 24 y.o., G1P1001   Principal Problem:   Postpartum care following vaginal delivery 3/23 Active Problems:   Encounter for induction of labor   SVD (spontaneous vaginal delivery)   Perineal laceration, second degree   Doing well - stable status  Routine post partum orders  Anticipate discharge tomorrow    4/23, MSN, CNM 05/14/2020, 7:48 AM

## 2020-05-14 NOTE — Lactation Note (Signed)
This note was copied from a baby's chart. Lactation Consultation Note Late entry for 2200. Mom requested employee pump. LC gave mom Freestyle Flex as requested.  Mom discussed some feeding difficulties she is having. Baby doesn't like Rt. Nipple. Mom had just finished using hand pump nipple was well everted. Mom stated that was because she had been pumping it for 5 minutes.  Gave mom shells encouraged to wear in am. Gave mom comfort gels for soreness. Encouraged mom to call for next feeding for LC to assist. Reported to RN.  Patient Name: [name] [id number] Date: 05/14/2020 Reason for consult: Follow-up assessment;Term Age:48 hours  Maternal Data    Feeding Mother's Current Feeding Choice: Breast Milk and Formula  LATCH Score                    Lactation Tools Discussed/Used Tools: Shells;Pump Breast pump type: Manual;Other (comment) (personal)  Interventions Interventions: Shells;Comfort gels  Discharge WIC Program: No  Consult Status Consult Status: Follow-up Date: 05/15/20 Follow-up type: In-patient    Charyl Dancer 05/14/2020, 11:36 PM

## 2020-05-15 ENCOUNTER — Other Ambulatory Visit (HOSPITAL_COMMUNITY): Payer: Self-pay | Admitting: Certified Nurse Midwife

## 2020-05-15 MED ORDER — IBUPROFEN 600 MG PO TABS: 600.0000 mg | ORAL_TABLET | Freq: Four times a day (QID) | ORAL | 0 refills | Status: DC

## 2020-05-15 MED ORDER — ACETAMINOPHEN 325 MG PO TABS: 650.0000 mg | ORAL_TABLET | ORAL | 1 refills | Status: DC | PRN

## 2020-05-15 MED FILL — IBUPROFEN 600 MG TABLET: 600 | 7 days supply | Qty: 30 | Fill #0

## 2020-05-15 MED FILL — ACETAMINOPHEN 325 MG TABS: 325 | 9 days supply | Qty: 100 | Fill #0

## 2020-05-15 NOTE — Discharge Summary (Signed)
OB Discharge Summary  Patient Name: Vanessa Winters DOB: 17-Feb-1996 MRN: [id number]  Date of admission: 05/13/2020 Delivering provider: Olivia Mackie   Admitting diagnosis: Encounter for induction of labor [Z34.90] Intrauterine pregnancy: [redacted]w[redacted]d     Secondary diagnosis: Patient Active Problem List   Diagnosis Date Noted  . Encounter for induction of labor 05/13/2020  . SVD (spontaneous vaginal delivery) 05/13/2020  . Postpartum care following vaginal delivery 3/23 05/13/2020  . Perineal laceration, second degree 05/13/2020  . Attention deficit hyperactivity disorder (ADHD), combined type 01/08/2020  . Bipolar 2 disorder (HCC) 06/13/2019  . Anxiety 07/25/2018    Date of discharge: 05/15/2020   Discharge diagnosis: Principal Problem:   Postpartum care following vaginal delivery 3/23 Active Problems:   Anxiety   Bipolar 2 disorder (HCC)   Attention deficit hyperactivity disorder (ADHD), combined type   Encounter for induction of labor   SVD (spontaneous vaginal delivery)   Perineal laceration, second degree                                                              Post partum procedures:None  Augmentation: AROM, Pitocin and Cytotec Pain control: Epidural  Laceration:2nd degree;Perineal  Episiotomy:None  Complications: None  Hospital course:  Induction of Labor With Vaginal Delivery   25 y.o. yo G1P1001 at [redacted]w[redacted]d was admitted to the hospital 05/13/2020 for induction of labor.  Indication for induction: Postdates.  Patient had an uncomplicated labor course as follows: Membrane Rupture Time/Date: 8:10 AM ,05/13/2020   Delivery Method:Vaginal, Spontaneous  Episiotomy: None  Lacerations:  2nd degree;Perineal  Details of delivery can be found in separate delivery note.  Patient had a routine postpartum course. Patient is discharged home 05/15/20.  Newborn Data: Birth date:05/13/2020  Birth time:4:27 PM  Gender:Female  Living status:Living  Apgars:8 ,9  Weight:3195 g    Physical exam  Vitals:   05/14/20 0727 05/14/20 1402 05/14/20 2036 05/15/20 0609  BP: 107/76 110/78 112/72 96/66  Pulse: 83 86 96 80  Resp: 16 16 19 18   Temp: 98.6 F (37 C) 98.4 F (36.9 C) 98.1 F (36.7 C) 97.7 F (36.5 C)  TempSrc: Oral Oral Oral Oral  SpO2: 98%  99% 99%   General: alert, cooperative and no distress Lochia: appropriate Uterine Fundus: firm Perineum: repair intact, no edema DVT Evaluation: No evidence of DVT seen on physical exam. No significant calf/ankle edema. Labs: Lab Results  Component Value Date   WBC 14.3 (H) 05/14/2020   HGB 10.5 (L) 05/14/2020   HCT 31.0 (L) 05/14/2020   MCV 97.2 05/14/2020   PLT 193 05/14/2020   CMP Latest Ref Rng & Units 08/21/2019  Glucose 65 - 99 mg/dL 84  BUN 7 - 25 mg/dL 12  Creatinine 08/23/2019 - 0.62 mg/dL 6.94  Sodium 8.54 - 627 mmol/L 139  Potassium 3.5 - 5.3 mmol/L 4.2  Chloride 98 - 110 mmol/L 107  CO2 20 - 32 mmol/L 26  Calcium 8.6 - 10.2 mg/dL 9.3  Total Protein 6.1 - 8.1 g/dL 6.4  Total Bilirubin 0.2 - 1.2 mg/dL 0.3  Alkaline Phos 25 - 125 -  AST 10 - 30 U/L 14  ALT 6 - 29 U/L 11   Edinburgh Postnatal Depression Scale Screening Tool 05/14/2020 05/13/2020 05/13/2020  I have been able to laugh and see  the funny side of things. (No Data) (No Data) (No Data)    Vaccines: TDaP UTD         Flu    UTD         COVID-19   UTD  Discharge instructions:  per After Visit Summary  After Visit Meds:  Allergies as of 05/15/2020      Reactions   Adhesive [tape] Other (See Comments)   STERI-STRIPS:  BLISTERS   Buspar [buspirone] Other (See Comments)   Constipation.       Medication List    STOP taking these medications   calcium carbonate 500 MG chewable tablet Commonly known as: TUMS - dosed in mg elemental calcium   docusate sodium 100 MG capsule Commonly known as: COLACE   ferrous sulfate 325 (65 FE) MG tablet     TAKE these medications   acetaminophen 325 MG tablet Commonly known as: Tylenol Take 2  tablets (650 mg total) by mouth every 4 (four) hours as needed (for pain scale < 4).   ibuprofen 600 MG tablet Commonly known as: ADVIL Take 1 tablet (600 mg total) by mouth every 6 (six) hours.   prenatal multivitamin Tabs tablet Take 1 tablet by mouth daily at 12 noon.      Diet: routine diet  Activity: Advance as tolerated. Pelvic rest for 6 weeks.   Newborn Data: Live born female  Birth Weight: 7 lb 0.7 oz (3195 g) APGAR: 8, 9  Newborn Delivery   Birth date/time: [birth year]/2020 16:27:00 Delivery type: Vaginal, Spontaneous     Named [place] Baby Feeding: Bottle Disposition:home with mother  Delivery Report:  Review the Delivery Report for details.    Follow up:  Follow-up Information    Olivia Mackie, MD. Schedule an appointment as soon as possible for a visit in 6 week(s).   Specialty: Obstetrics and Gynecology Contact information: [address] [place] [address] [phone]              Clancy Gourd, MSN 05/15/2020, 11:28 AM

## 2020-05-15 NOTE — Lactation Note (Signed)
This note was copied from a baby's chart. Lactation Consultation Note Baby 23 hrs old.  Mom states she is going home today.  Mom wanted assistance latching to Rt. Breast. Assisted and baby unable to latch in cross cradle position. Attempted football position. Baby unable to latch. LC noted that scar tissue to areola is hard and makes areola non-compressible. Suggested to try NS on that breast or pump since unable to latch. Baby to Lt. Breast. Baby doesn't open mouth very wide. Encouraged to flange lips. Baby comes off of breast, nipple pinched.  Encouraged to wear shells.  Mom stated she is supplementing after BF. LC encouraged it d/t poor feeding. Mom stated with her anxiety that she may pump and bottle feed and give formula until her milk comes in. [facility] encouraged mom to do what ever is good for her as long as the baby eats. Mom has a lot of BM. Encouraged mom to pump and bottle feed.  Gave mom information sheet on how much baby should be taking in when feeding from bottle. Reviewed engorgement management, milk storage, build milk supply, breast massage, hand free bra, cont. STS, and I&O documentation to give to Dr.  Zenovia Jordan mom's questions. Reminded of OP LC services. Offered to set up DEBP before d/c home. Mom declined.  Patient Name: Vanessa Winters Date: 05/15/2020 Reason for consult: Follow-up assessment;Primapara;Term Age:39 hours  Maternal Data Has patient been taught Hand Expression?: Yes Does the patient have breastfeeding experience prior to this delivery?: No  Feeding Mother's Current Feeding Choice: Breast Milk and Formula Nipple Type: Slow - flow  LATCH Score Latch: Repeated attempts needed to sustain latch, nipple held in mouth throughout feeding, stimulation needed to elicit sucking reflex.  Audible Swallowing: None  Type of Nipple: Everted at rest and after stimulation (very short shaft)  Comfort (Breast/Nipple): Filling, red/small blisters or  bruises, mild/mod discomfort  Hold (Positioning): Full assist, staff holds infant at breast  LATCH Score: 4   Lactation Tools Discussed/Used Tools: Pump;Shells Breast pump type: Other (comment) (mom has personal pump. declined to use DEBP at hospital) Pump Education: Milk Storage Pumping frequency: Q3  Interventions Interventions: DEBP;Breast compression;Pre-pump if needed;Adjust position;Hand pump;Shells;Hand express;Breast massage;Assisted with latch;Skin to skin;Position options;Support pillows;Breast feeding basics reviewed;Education  Discharge Discharge Education: Engorgement and breast care;Warning signs for feeding baby Pump: Employee Pump WIC Program: No  Consult Status Consult Status: Complete Date: 05/15/20    Charyl Dancer 05/15/2020, 7:35 AM

## 2020-05-30 ENCOUNTER — Other Ambulatory Visit: Payer: Self-pay | Admitting: Physician Assistant

## 2020-05-30 MED ORDER — NORETHINDRONE 0.35 MG PO TABS
1.0000 | ORAL_TABLET | Freq: Every day | ORAL | 3 refills | Status: DC
  Filled 2020-05-30 – 2020-06-16 (×2): qty 84, 84d supply, fill #0
  Filled 2020-09-02: qty 84, 84d supply, fill #1
  Filled 2020-11-26: qty 84, 84d supply, fill #2
  Filled 2021-02-17: qty 84, 84d supply, fill #3

## 2020-05-30 NOTE — Progress Notes (Signed)
Breast feeding and needs birth control.

## 2020-06-01 ENCOUNTER — Other Ambulatory Visit (HOSPITAL_BASED_OUTPATIENT_CLINIC_OR_DEPARTMENT_OTHER): Payer: Self-pay

## 2020-06-11 ENCOUNTER — Other Ambulatory Visit (HOSPITAL_BASED_OUTPATIENT_CLINIC_OR_DEPARTMENT_OTHER): Payer: Self-pay

## 2020-06-15 ENCOUNTER — Telehealth (INDEPENDENT_AMBULATORY_CARE_PROVIDER_SITE_OTHER): Payer: No Typology Code available for payment source | Admitting: Psychiatry

## 2020-06-15 ENCOUNTER — Encounter (HOSPITAL_COMMUNITY): Payer: Self-pay | Admitting: Psychiatry

## 2020-06-15 DIAGNOSIS — F422 Mixed obsessional thoughts and acts: Secondary | ICD-10-CM | POA: Diagnosis not present

## 2020-06-15 DIAGNOSIS — F3181 Bipolar II disorder: Secondary | ICD-10-CM | POA: Diagnosis not present

## 2020-06-15 NOTE — Progress Notes (Signed)
BH MD/PA/NP OP Progress Note  Virtual Visit via Video Note  I connected with Vanessa Winters on 06/15/20 at  3:20 PM EDT by a video enabled telemedicine application and verified that I am speaking with the correct person using two identifiers.  Location: Patient: Home Provider: Clinic   I discussed the limitations of evaluation and management by telemedicine and the availability of in person appointments. The patient expressed understanding and agreed to proceed.  I provided 17 minutes of non-face-to-face time during this encounter.     06/15/2020 3:30 PM Vanessa Winters  MRN:  [id number]  Chief Complaint:  " I am doing fine for right now."   HPI: Patient reported she is doing well.  She delivered her baby on March 23.  She informed that she is lactating and is pumping breastmilk on a regular basis.  She informed that she had to be induced and her labor was slightly complicated however she and the baby did well in the postpartum period. She stated that she is doing well in terms of her mood and anxiety.  She is not having any frequent crying spells are intense emotional changes.  She thinks that she is sleeping well as well as she can with a young baby.  She is not obsessing about things that are beyond her control.  She is not experiencing extreme anxiety or obsessive or intrusive thoughts. She stated that for now since she is lactating and pumping milk she does not have intention to get back on medications.  She is supposed to return back to work on June 20th and she is not sure if she will still be pumping milk by then. She stated that she would like to hold off on starting the medications.  She would like to touch base with the writer after she starts work by end of June for follow-up.  Writer informed her that we will [place] [facility] by end of June however her care is being transferred to a different provider in the [facility] psychiatry clinic.  Patient verbalized  understanding and agreement to be seen by the other provider.   Visit Diagnosis:    ICD-10-CM   1. Bipolar 2 disorder (HCC)  F31.81   2. Mixed obsessional thoughts and acts  F42.2     Past Psychiatric History:  OCD and anxiety.  Was being managed on Wellbutrin XL 150 mg and Lamictal 100 mg daily, medications were discontinued after she found out that she was pregnant.  Past Medical History:  Past Medical History:  Diagnosis Date  . Colon polyps   . Discharge from right nipple 07/2016  . GERD (gastroesophageal reflux disease)   . Hyperlipidemia   . Migraines   . Seasonal allergies     Past Surgical History:  Procedure Laterality Date  . BREAST DUCTAL SYSTEM EXCISION Right 08/04/2016   Procedure: RIGHT NIPPLE DUCT EXCISION;  Surgeon: Manus Rudd, MD;  Location: [facility];  Service: General;  Laterality: Right;  . WISDOM TOOTH EXTRACTION      Family Psychiatric History:  Two sisters and mom diagnosed with bipolar disorder (unspecified). One sister taking Latuda, other sister and mom untreated.    Family History:  Family History  Problem Relation Age of Onset  . Hypertension Mother   . Breast cancer Mother   . Hyperlipidemia Father   . Breast cancer Maternal Grandmother   . Leukemia Paternal Grandmother   . Parkinson's disease Paternal Grandfather   . Skin cancer Other  uncle  . Heart attack Other        great uncle  . Stroke Other        great uncle    Social History:  Social History   Socioeconomic History  . Marital status: Married    Spouse name: Not on file  . Number of children: Not on file  . Years of education: Not on file  . Highest education level: Not on file  Occupational History  . Not on file  Tobacco Use  . Smoking status: Former Smoker    Packs/day: 0.50    Years: 6.00    Pack years: 3.00    Types: E-cigarettes    Quit date: 05/06/2019    Years since quitting: 1.1  . Smokeless tobacco: Never Used  Vaping Use  .  Vaping Use: Never used  Substance and Sexual Activity  . Alcohol use: Yes    Comment: Occasional  . Drug use: Never  . Sexual activity: Yes    Partners: Male    Birth control/protection: None    Comment: nuvaring  Other Topics Concern  . Not on file  Social History Narrative  . Not on file   Social Determinants of Health   Financial Resource Strain: Not on file  Food Insecurity: Not on file  Transportation Needs: Not on file  Physical Activity: Not on file  Stress: Not on file  Social Connections: Not on file    Allergies:  Allergies  Allergen Reactions  . Adhesive [Tape] Other (See Comments)    STERI-STRIPS:  BLISTERS  . Buspar [Buspirone] Other (See Comments)    Constipation.     Metabolic Disorder Labs: No results found for: HGBA1C, MPG Lab Results  Component Value Date   PROLACTIN 7.5 07/09/2019   PROLACTIN 10.4 07/18/2017   Lab Results  Component Value Date   CHOL 179 08/21/2019   TRIG 52 08/21/2019   HDL 61 08/21/2019   CHOLHDL 2.9 08/21/2019   VLDL 10 12/05/2014   LDLCALC 105 (H) 08/21/2019   LDLCALC 112 (H) 07/25/2018   Lab Results  Component Value Date   TSH 0.79 07/09/2019   TSH 1.64 07/25/2018    Therapeutic Level Labs: No results found for: LITHIUM No results found for: VALPROATE No components found for:  CBMZ  Current Medications: Current Outpatient Medications  Medication Sig Dispense Refill  . acetaminophen (TYLENOL) 325 MG tablet TAKE 2 TABLETS BY MOUTH EVERY 4 HOURS AS NEEDED FOR PAIN SCALE < 4 100 tablet 1  . ibuprofen (ADVIL) 600 MG tablet TAKE 1 TABLET BY MOUTH EVERY 6 HOURS 30 tablet 0  . norethindrone (MICRONOR) 0.35 MG tablet Take 1 tablet (0.35 mg total) by mouth daily. 84 tablet 3  . Prenatal Vit-Fe Fumarate-FA (PRENATAL MULTIVITAMIN) TABS tablet Take 1 tablet by mouth daily at 12 noon.     No current facility-administered medications for this visit.     Psychiatric Specialty Exam:   Last menstrual period 05/12/2019,  unknown if currently breastfeeding.There is no height or weight on file to calculate BMI.  General Appearance: Fairly Groomed  Eye Contact:  Good  Speech:  Normal Rate  Volume:  Normal  Mood:  Euthymic  Affect:  Congruent  Thought Process:  Coherent, Goal Directed and Linear  Orientation:  Full (Time, Place, and Person)  Thought Content: WDL and Logical   Suicidal Thoughts:  No  Homicidal Thoughts:  No  Memory:  Immediate;   Good Recent;   Good Remote;   Good  Judgement:  Good  Insight:  Good  Psychomotor Activity:  Normal  Concentration:  Concentration: Good and Attention Span: Good  Recall:  Good  Fund of Knowledge: Good  Language: Good  Akathisia:  No  Handed:  Right  AIMS (if indicated): 0  Assets:  Communication Skills Desire for Improvement Financial Resources/Insurance Housing  ADL's:  Intact  Cognition: WNL  Sleep:  Fair   Screenings: GAD-7   Flowsheet Row Office Visit from 08/21/2019 in [facility] Office Visit from 03/01/2019 in [facility] Office Visit from 09/05/2018 in [facility] Abstract from 07/19/2018 in [facility] At [facility]  Total GAD-7 Score 4 1 13 13     PHQ2-9   Flowsheet Row Office Visit from 08/21/2019 in [facility] Office Visit from 03/01/2019 in [facility] Office Visit from 09/05/2018 in [facility] At [facility] Abstract from 07/19/2018 in [facility] At [facility]  PHQ-2 Total Score 2 2 2 2   PHQ-9 Total Score 4 7 12 10        Assessment and Plan: Pt recently delivered her baby about a month ago.  Patient is denying any depressive symptoms or frequent intrusive thoughts at this point.  She still lactating and does not want to restart her medications at present.  She is planning to return back to work on June  20th and would like to touch base with a psychiatrist after she returns back to work to make plans for the future.   1. Bipolar 2 disorder (HCC)   2. Mixed obsessional thoughts and acts   Continue to manage with out medications for now. Follow-up in 2 months. Patient is aware that her care is being transferred to a different provider.  , MD 06/15/2020, 3:30 PM

## 2020-06-16 ENCOUNTER — Other Ambulatory Visit (HOSPITAL_BASED_OUTPATIENT_CLINIC_OR_DEPARTMENT_OTHER): Payer: Self-pay

## 2020-06-24 ENCOUNTER — Other Ambulatory Visit: Payer: Self-pay

## 2020-06-24 DIAGNOSIS — Z0100 Encounter for examination of eyes and vision without abnormal findings: Secondary | ICD-10-CM

## 2020-07-25 ENCOUNTER — Telehealth (INDEPENDENT_AMBULATORY_CARE_PROVIDER_SITE_OTHER): Payer: No Typology Code available for payment source | Admitting: Physician Assistant

## 2020-07-25 DIAGNOSIS — N61 Mastitis without abscess: Secondary | ICD-10-CM

## 2020-07-25 MED ORDER — DICLOXACILLIN SODIUM 500 MG PO CAPS: 500.0000 mg | ORAL_CAPSULE | Freq: Four times a day (QID) | ORAL | 0 refills | Status: DC

## 2020-07-25 NOTE — Telephone Encounter (Signed)
Pt has had 1 week of a clogged breast duct that she has been using ibuprofen, pumping, warm compresses. She thought she was getting better but then woke up this morning with fever of 101 and sweating. She continues to pump and nurse. Sent dicloxacillin for 10 days.

## 2020-08-14 ENCOUNTER — Telehealth: Payer: Self-pay | Admitting: Neurology

## 2020-08-14 MED ORDER — FLUCONAZOLE 150 MG PO TABS: 150.0000 mg | ORAL_TABLET | Freq: Once | ORAL | 0 refills | Status: AC

## 2020-08-14 NOTE — Telephone Encounter (Signed)
Diflucan sent to pharmacy per [facility].

## 2020-08-18 ENCOUNTER — Telehealth: Payer: No Typology Code available for payment source | Admitting: Psychiatry

## 2020-08-21 ENCOUNTER — Encounter: Payer: Self-pay | Admitting: Physician Assistant

## 2020-08-21 ENCOUNTER — Encounter: Payer: No Typology Code available for payment source | Admitting: Family Medicine

## 2020-08-21 ENCOUNTER — Other Ambulatory Visit: Payer: Self-pay

## 2020-08-21 ENCOUNTER — Ambulatory Visit (INDEPENDENT_AMBULATORY_CARE_PROVIDER_SITE_OTHER): Payer: No Typology Code available for payment source | Admitting: Physician Assistant

## 2020-08-21 VITALS — BP 100/57 | HR 89 | Wt 140.0 lb

## 2020-08-21 DIAGNOSIS — Z0000 Encounter for general adult medical examination without abnormal findings: Secondary | ICD-10-CM

## 2020-08-21 DIAGNOSIS — Z13220 Encounter for screening for lipoid disorders: Secondary | ICD-10-CM | POA: Diagnosis not present

## 2020-08-21 DIAGNOSIS — J40 Bronchitis, not specified as acute or chronic: Secondary | ICD-10-CM

## 2020-08-21 DIAGNOSIS — J329 Chronic sinusitis, unspecified: Secondary | ICD-10-CM

## 2020-08-21 DIAGNOSIS — Z131 Encounter for screening for diabetes mellitus: Secondary | ICD-10-CM

## 2020-08-21 MED ORDER — AMOXICILLIN-POT CLAVULANATE 875-125 MG PO TABS: 1.0000 | ORAL_TABLET | Freq: Two times a day (BID) | ORAL | 0 refills | Status: AC

## 2020-08-21 NOTE — Patient Instructions (Signed)
Health Maintenance, Female Adopting a healthy lifestyle and getting preventive care are important in promoting health and wellness. Ask your health care provider about: The right schedule for you to have regular tests and exams. Things you can do on your own to prevent diseases and keep yourself healthy. What should I know about diet, weight, and exercise? Eat a healthy diet  Eat a diet that includes plenty of vegetables, fruits, low-fat dairy products, and lean protein. Do not eat a lot of foods that are high in solid fats, added sugars, or sodium.  Maintain a healthy weight Body mass index (BMI) is used to identify weight problems. It estimates body fat based on height and weight. Your health care provider can help determineyour BMI and help you achieve or maintain a healthy weight. Get regular exercise Get regular exercise. This is one of the most important things you can do for your health. Most adults should: Exercise for at least 150 minutes each week. The exercise should increase your heart rate and make you sweat (moderate-intensity exercise). Do strengthening exercises at least twice a week. This is in addition to the moderate-intensity exercise. Spend less time sitting. Even light physical activity can be beneficial. Watch cholesterol and blood lipids Have your blood tested for lipids and cholesterol at 27 years of age, then havethis test every 5 years. Have your cholesterol levels checked more often if: Your lipid or cholesterol levels are high. You are older than 27 years of age. You are at high risk for heart disease. What should I know about cancer screening? Depending on your health history and family history, you may need to have cancer screening at various ages. This may include screening for: Breast cancer. Cervical cancer. Colorectal cancer. Skin cancer. Lung cancer. What should I know about heart disease, diabetes, and high blood pressure? Blood pressure and heart  disease High blood pressure causes heart disease and increases the risk of stroke. This is more likely to develop in people who have high blood pressure readings, are of African descent, or are overweight. Have your blood pressure checked: Every 3-5 years if you are 18-39 years of age. Every year if you are 40 years old or older. Diabetes Have regular diabetes screenings. This checks your fasting blood sugar level. Have the screening done: Once every three years after age 40 if you are at a normal weight and have a low risk for diabetes. More often and at a younger age if you are overweight or have a high risk for diabetes. What should I know about preventing infection? Hepatitis B If you have a higher risk for hepatitis B, you should be screened for this virus. Talk with your health care provider to find out if you are at risk forhepatitis B infection. Hepatitis C Testing is recommended for: Everyone born from 1945 through 1965. Anyone with known risk factors for hepatitis C. Sexually transmitted infections (STIs) Get screened for STIs, including gonorrhea and chlamydia, if: You are sexually active and are younger than 27 years of age. You are older than 27 years of age and your health care provider tells you that you are at risk for this type of infection. Your sexual activity has changed since you were last screened, and you are at increased risk for chlamydia or gonorrhea. Ask your health care provider if you are at risk. Ask your health care provider about whether you are at high risk for HIV. Your health care provider may recommend a prescription medicine to help   prevent HIV infection. If you choose to take medicine to prevent HIV, you should first get tested for HIV. You should then be tested every 3 months for as long as you are taking the medicine. Pregnancy If you are about to stop having your period (premenopausal) and you may become pregnant, seek counseling before you get  pregnant. Take 400 to 800 micrograms (mcg) of folic acid every day if you become pregnant. Ask for birth control (contraception) if you want to prevent pregnancy. Osteoporosis and menopause Osteoporosis is a disease in which the bones lose minerals and strength with aging. This can result in bone fractures. If you are 65 years old or older, or if you are at risk for osteoporosis and fractures, ask your health care provider if you should: Be screened for bone loss. Take a calcium or vitamin D supplement to lower your risk of fractures. Be given hormone replacement therapy (HRT) to treat symptoms of menopause. Follow these instructions at home: Lifestyle Do not use any products that contain nicotine or tobacco, such as cigarettes, e-cigarettes, and chewing tobacco. If you need help quitting, ask your health care provider. Do not use street drugs. Do not share needles. Ask your health care provider for help if you need support or information about quitting drugs. Alcohol use Do not drink alcohol if: Your health care provider tells you not to drink. You are pregnant, may be pregnant, or are planning to become pregnant. If you drink alcohol: Limit how much you use to 0-1 drink a day. Limit intake if you are breastfeeding. Be aware of how much alcohol is in your drink. In the [place] one drink equals one 12 oz bottle of beer (355 mL), one 5 oz glass of wine (148 mL), or one 1 oz glass of hard liquor (44 mL). General instructions Schedule regular health, dental, and eye exams. Stay current with your vaccines. Tell your health care provider if: You often feel depressed. You have ever been abused or do not feel safe at home. Summary Adopting a healthy lifestyle and getting preventive care are important in promoting health and wellness. Follow your health care provider's instructions about healthy diet, exercising, and getting tested or screened for diseases. Follow your health care provider's  instructions on monitoring your cholesterol and blood pressure. This information is not intended to replace advice given to you by your health care provider. Make sure you discuss any questions you have with your healthcare provider. Document Revised: 01/31/2018 Document Reviewed: 01/31/2018 Elsevier Patient Education  2022 Elsevier Inc.  

## 2020-08-21 NOTE — Progress Notes (Signed)
Subjective:     Vanessa Winters is a 25 y.o. female and is here for a comprehensive physical exam. The patient reports problems - continues to have sinus pressure, sinus drainage, coughing after having covid over 2 weeks ago. She feels better but cannot get rid of cough and congestion. No fever, chills, sOB.  Marland Kitchen  Social History   Socioeconomic History   Marital status: Married    Spouse name: Not on file   Number of children: Not on file   Years of education: Not on file   Highest education level: Not on file  Occupational History   Not on file  Tobacco Use   Smoking status: Former    Packs/day: 0.50    Years: 6.00    Pack years: 3.00    Types: E-cigarettes, Cigarettes    Quit date: 05/06/2019    Years since quitting: 1.2   Smokeless tobacco: Never  Vaping Use   Vaping Use: Never used  Substance and Sexual Activity   Alcohol use: Yes    Comment: Occasional   Drug use: Never   Sexual activity: Yes    Partners: Male    Birth control/protection: None    Comment: nuvaring  Other Topics Concern   Not on file  Social History Narrative   Not on file   Social Determinants of Health   Financial Resource Strain: Not on file  Food Insecurity: Not on file  Transportation Needs: Not on file  Physical Activity: Not on file  Stress: Not on file  Social Connections: Not on file  Intimate Partner Violence: Not on file   Health Maintenance  Topic Date Due   COVID-19 Vaccine (3 - Pfizer risk series) 06/29/2019   Pneumococcal Vaccine 76-9 Years old (1 - PCV) 08/21/2021 (Originally 01/02/2000)   HPV VACCINES (1 - 2-dose series) 08/21/2021 (Originally 01/01/2005)   INFLUENZA VACCINE  09/21/2020   TETANUS/TDAP  04/20/2021   PAP-Cervical Cytology Screening  08/21/2022   PAP SMEAR-Modifier  08/21/2022   Hepatitis C Screening  Completed   HIV Screening  Completed    The following portions of the patient's history were reviewed and updated as appropriate: allergies, current medications,  past family history, past medical history, past social history, past surgical history, and problem list.  Review of Systems Pertinent items noted in HPI and remainder of comprehensive ROS otherwise negative.   Objective:    BP (!) 100/57   Pulse 89   Wt 140 lb (63.5 kg)   SpO2 99%   Breastfeeding Yes   BMI 26.45 kg/m  General appearance: alert, cooperative, and appears stated age Head: Normocephalic, without obvious abnormality, atraumatic  Eyes: conjunctivae/corneas clear. PERRL, EOM's intact. Fundi benign. Ears: normal TM's and external ear canals both ears Nose: bilateral swollen nasal turbinates. Tenderness over maxillary sinuses to palpation.  Throat: lips, mucosa, and tongue normal; teeth and gums normal Neck: no adenopathy, no carotid bruit, no JVD, supple, symmetrical, trachea midline, and thyroid not enlarged, symmetric, no tenderness/mass/nodules Back: symmetric, no curvature. ROM normal. No CVA tenderness. Lungs: clear to auscultation bilaterally Heart: regular rate and rhythm, S1, S2 normal, no murmur, click, rub or gallop Abdomen: soft, non-tender; bowel sounds normal; no masses,  no organomegaly Extremities: extremities normal, atraumatic, no cyanosis or edema Pulses: 2+ and symmetric Skin: Skin color, texture, turgor normal. No rashes or lesions Lymph nodes: Cervical, supraclavicular, and axillary nodes normal. Neurologic: Grossly normal   .. Depression screen [facility] 2/9 08/21/2020 08/21/2019 03/01/2019 09/05/2018 07/19/2018  Decreased Interest 0  1 1 1 1   Down, Depressed, Hopeless 1 1 1 1 1   PHQ - 2 Score 1 2 2 2 2   Altered sleeping 0 1 3 1  0  Tired, decreased energy 1 0 1 2 3   Change in appetite 0 0 1 2 1   Feeling bad or failure about yourself  2 0 0 2 2  Trouble concentrating 0 1 0 2 2  Moving slowly or fidgety/restless 0 0 0 1 0  Suicidal thoughts 0 0 0 0 0  PHQ-9 Score 4 4 7 12 10   Difficult doing work/chores Somewhat difficult Somewhat difficult Somewhat difficult  Somewhat difficult Somewhat difficult    Assessment:    Healthy female exam.      Plan:   Gari was seen today for annual exam.  Diagnoses and all orders for this visit:  Routine physical examination -     Lipid Panel w/reflex Direct LDL -     COMPLETE METABOLIC PANEL WITH GFR -     TSH -     CBC with Differential/Platelet  Sinobronchitis -     amoxicillin-clavulanate (AUGMENTIN) 875-125 MG tablet; Take 1 tablet by mouth 2 (two) times daily for 10 days.  Screening for lipid disorders -     Lipid Panel w/reflex Direct LDL -     COMPLETE METABOLIC PANEL WITH GFR  Screening for diabetes mellitus -     TSH  . Discussed 150 minutes of exercise a week.  Encouraged vitamin D 1000 units and Calcium 1300mg  or 4 servings of dairy a day.  Fasting labs ordered.  PHQ stable. Pt is breasfeeding and does not want to be on medications.  Pap UTD.  Low BP today. Stay hydrated and make sure eating some salt in diet.  Covid vaccine UTD. Will get booster in august.   Sinobronchitis after covid. Sent augmentin. Follow up as needed if symptoms persist. Lungs sound great.      See After Visit Summary for Counseling Recommendations

## 2020-08-22 LAB — LIPID PANEL W/REFLEX DIRECT LDL
Cholesterol: 220 mg/dL — ABNORMAL HIGH (ref <200)
HDL: 40 mg/dL — ABNORMAL LOW (ref >=50)
LDL Cholesterol (Calc): 166 mg/dL (calc) — ABNORMAL HIGH
Non-HDL Cholesterol (Calc): 180 mg/dL (calc) — ABNORMAL HIGH (ref <130)
Total CHOL/HDL Ratio: 5.5 (calc) — ABNORMAL HIGH (ref <5.0)
Triglycerides: 45 mg/dL (ref <150)

## 2020-08-22 LAB — CBC WITH DIFFERENTIAL/PLATELET
Absolute Monocytes: 361 cells/uL (ref 200–950)
Basophils Absolute: 17 cells/uL (ref 0–200)
Basophils Relative: 0.2 %
Eosinophils Absolute: 249 cells/uL (ref 15–500)
Eosinophils Relative: 2.9 %
HCT: 40.1 % (ref 35.0–45.0)
Hemoglobin: 13.3 g/dL (ref 11.7–15.5)
Lymphs Abs: 1935 cells/uL (ref 850–3900)
MCH: 30 pg (ref 27.0–33.0)
MCHC: 33.2 g/dL (ref 32.0–36.0)
MCV: 90.5 fL (ref 80.0–100.0)
MPV: 9.9 fL (ref 7.5–12.5)
Monocytes Relative: 4.2 %
Neutro Abs: 6037 cells/uL (ref 1500–7800)
Neutrophils Relative %: 70.2 %
Platelets: 440 10*3/uL — ABNORMAL HIGH (ref 140–400)
RBC: 4.43 10*6/uL (ref 3.80–5.10)
RDW: 11.8 % (ref 11.0–15.0)
Total Lymphocyte: 22.5 %
WBC: 8.6 10*3/uL (ref 3.8–10.8)

## 2020-08-22 LAB — COMPLETE METABOLIC PANEL WITH GFR
AG Ratio: 1.4 (calc) (ref 1.0–2.5)
ALT: 8 U/L (ref 6–29)
AST: 11 U/L (ref 10–30)
Albumin: 4 g/dL (ref 3.6–5.1)
Alkaline phosphatase (APISO): 67 U/L (ref 31–125)
BUN: 7 mg/dL (ref 7–25)
CO2: 24 mmol/L (ref 20–32)
Calcium: 9.4 mg/dL (ref 8.6–10.2)
Chloride: 104 mmol/L (ref 98–110)
Creat: 0.77 mg/dL (ref 0.50–1.10)
GFR, Est African American: 124 mL/min/{1.73_m2} (ref >=60)
GFR, Est Non African American: 107 mL/min/{1.73_m2} (ref >=60)
Globulin: 2.8 g/dL (calc) (ref 1.9–3.7)
Glucose, Bld: 69 mg/dL (ref 65–99)
Potassium: 4.4 mmol/L (ref 3.5–5.3)
Sodium: 140 mmol/L (ref 135–146)
Total Bilirubin: 0.3 mg/dL (ref 0.2–1.2)
Total Protein: 6.8 g/dL (ref 6.1–8.1)

## 2020-08-22 LAB — TSH: TSH: 0.59 mIU/L

## 2020-08-25 NOTE — Progress Notes (Signed)
Kashena,   Thyroid normal range.  Hemoglobin great.  Platelets up some but expected since you are recovering from covid.  LDL up and HDL down since being off medication for pregnancy and breastfeeding.

## 2020-09-02 ENCOUNTER — Other Ambulatory Visit (HOSPITAL_BASED_OUTPATIENT_CLINIC_OR_DEPARTMENT_OTHER): Payer: Self-pay

## 2020-11-26 ENCOUNTER — Other Ambulatory Visit (HOSPITAL_BASED_OUTPATIENT_CLINIC_OR_DEPARTMENT_OTHER): Payer: Self-pay

## 2020-12-18 ENCOUNTER — Other Ambulatory Visit: Payer: Self-pay

## 2020-12-18 ENCOUNTER — Ambulatory Visit (INDEPENDENT_AMBULATORY_CARE_PROVIDER_SITE_OTHER): Payer: No Typology Code available for payment source | Admitting: Medical-Surgical

## 2020-12-18 DIAGNOSIS — Z23 Encounter for immunization: Secondary | ICD-10-CM | POA: Diagnosis not present

## 2021-01-26 ENCOUNTER — Encounter: Payer: Self-pay | Admitting: Physician Assistant

## 2021-02-17 ENCOUNTER — Other Ambulatory Visit (HOSPITAL_BASED_OUTPATIENT_CLINIC_OR_DEPARTMENT_OTHER): Payer: Self-pay

## 2021-03-05 ENCOUNTER — Ambulatory Visit (INDEPENDENT_AMBULATORY_CARE_PROVIDER_SITE_OTHER): Payer: No Typology Code available for payment source | Admitting: Physician Assistant

## 2021-03-05 ENCOUNTER — Other Ambulatory Visit: Payer: Self-pay

## 2021-03-05 ENCOUNTER — Encounter: Payer: Self-pay | Admitting: Physician Assistant

## 2021-03-05 ENCOUNTER — Other Ambulatory Visit (HOSPITAL_BASED_OUTPATIENT_CLINIC_OR_DEPARTMENT_OTHER): Payer: Self-pay

## 2021-03-05 VITALS — BP 118/76 | HR 78 | Ht 61.0 in | Wt 143.0 lb

## 2021-03-05 DIAGNOSIS — F419 Anxiety disorder, unspecified: Secondary | ICD-10-CM

## 2021-03-05 DIAGNOSIS — Z3009 Encounter for other general counseling and advice on contraception: Secondary | ICD-10-CM | POA: Diagnosis not present

## 2021-03-05 DIAGNOSIS — F902 Attention-deficit hyperactivity disorder, combined type: Secondary | ICD-10-CM

## 2021-03-05 DIAGNOSIS — F3181 Bipolar II disorder: Secondary | ICD-10-CM

## 2021-03-05 MED ORDER — ETONOGESTREL-ETHINYL ESTRADIOL 0.12-0.015 MG/24HR VA RING
VAGINAL_RING | VAGINAL | 3 refills | Status: DC
Start: 2021-03-05 — End: 2021-05-10
  Filled 2021-03-05: qty 3, 84d supply, fill #0

## 2021-03-05 MED ORDER — BUPROPION HCL ER (XL) 150 MG PO TB24
150.0000 mg | ORAL_TABLET | Freq: Every morning | ORAL | 1 refills | Status: DC
Start: 2021-03-05 — End: 2021-10-08
  Filled 2021-03-05: qty 90, 90d supply, fill #0
  Filled 2021-06-11: qty 90, 90d supply, fill #1

## 2021-03-05 NOTE — Progress Notes (Signed)
Subjective:    Patient ID: Vanessa Winters, female    DOB: 09-13-1995, 26 y.o.   MRN: [id number]  HPI Pt is a 26 yo female with Bipolar 2, GAD, ADHD who presents to the clinic to restart medications. She is finished Breast Feeding and wants to restart medication for mood. She felt better on medication but did not want to be on it while being pregnant or breast feeding.   .. Active Ambulatory Problems    Diagnosis Date Noted   Anxiety 07/25/2018   Bipolar 2 disorder ([place]) 06/13/2019   Attention deficit hyperactivity disorder (ADHD), combined type 01/08/2020   Encounter for induction of labor 05/13/2020   SVD (spontaneous vaginal delivery) 05/13/2020   Postpartum care following vaginal delivery 3/23 05/13/2020   Perineal laceration, second degree 05/13/2020   Birth control counseling 03/08/2021   Resolved Ambulatory Problems    Diagnosis Date Noted   Hyperlipidemia 05/22/2011   History of migraine headaches 02/18/2013   Irregular menses 02/18/2013   Hidradenitis axillaris 11/07/2014   Discharge from right nipple 05/19/2016   Tobacco abuse 07/25/2018   Obsessive-compulsive behavior 07/25/2018   No energy 07/25/2018   Intractable migraine with aura without status migrainosus 07/25/2018   Trouble in sleeping 03/04/2019   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 03/04/2019   Arthralgia 03/04/2019   Hot flashes 03/04/2019   Nicotine withdrawal 03/13/2019   Fidgeting 04/01/2019   Inattention 04/01/2019   Hypoactive bowel sounds 04/10/2019   History of colitis 04/10/2019   Left lower quadrant pain 04/10/2019   Constipation 04/10/2019   Mood changes 04/30/2019   Family history of bipolar disorder 04/30/2019   Urinary incontinence without sensory awareness 06/03/2019   Brain fog 06/03/2019   Frequent headaches 06/03/2019   Dizziness 06/03/2019   Tachycardia 06/03/2019   Vision changes 06/03/2019   Near syncope 06/03/2019   Other symptoms and  signs involving the nervous system 06/03/2019   Mixed obsessional thoughts and acts 06/13/2019   Right knee pain 08/05/2019   Hypotension 08/14/2019   Low serum vitamin B12 08/23/2019   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     Objective:   Physical Exam Vitals reviewed.  Constitutional:      Appearance: Normal appearance.  HENT:     Head: Normocephalic.  Cardiovascular:     Rate and Rhythm: Normal rate and regular rhythm.     Pulses: Normal pulses.     Heart sounds: No murmur heard. Pulmonary:     Effort: Pulmonary effort is normal.     Breath sounds: Normal breath sounds.  Musculoskeletal:     Right lower leg: No edema.     Left lower leg: No edema.  Neurological:     General: No focal deficit present.     Mental Status: She is alert and oriented to person, place, and time.  Psychiatric:        Mood and Affect: Mood normal.   .. Depression screen [facility] 2/9 03/05/2021 08/21/2020 08/21/2019 03/01/2019 09/05/2018  Decreased Interest 1 0 1 1 1   Down, Depressed, Hopeless 1 1 1 1 1   PHQ - 2 Score 2 1 2 2 2   Altered sleeping 1 0 1 3 1   Tired, decreased energy 1 1  0 1 2  Change in appetite 1 0 0 1 2  Feeling bad or failure about yourself  1 2 0 0 2  Trouble concentrating 2 0 1 0 2  Moving slowly or fidgety/restless 2 0 0 0 1  Suicidal thoughts 0 0 0 0 0  PHQ-9 Score 10 4 4 7 12   Difficult doing work/chores Somewhat difficult Somewhat difficult Somewhat difficult Somewhat difficult Somewhat difficult   .Marland Kitchen GAD 7 : Generalized Anxiety Score 03/05/2021 08/21/2019 03/01/2019 09/05/2018  Nervous, Anxious, on Edge 3 1 1 2   Control/stop worrying 3 1 0 2  Worry too much - different things 3 1 0 2  Trouble relaxing 3 0 0 2  Restless 3 0 0 1  Easily annoyed or irritable 1 1  0 2  Afraid - awful might happen 3 0 0 2  Total GAD 7 Score 19 4 1 13   Anxiety Difficulty Somewhat difficult Somewhat difficult Somewhat difficult Somewhat difficult            Assessment & Plan:  Marland KitchenMarland KitchenCortney was seen today for medication refill.  Diagnoses and all orders for this visit:  Bipolar 2 disorder ([place]) -     buPROPion (WELLBUTRIN XL) 150 MG 24 hr tablet; Take 1 tablet (150 mg total) by mouth in the morning.  Attention deficit hyperactivity disorder (ADHD), combined type -     buPROPion (WELLBUTRIN XL) 150 MG 24 hr tablet; Take 1 tablet (150 mg total) by mouth in the morning.  Anxiety -     buPROPion (WELLBUTRIN XL) 150 MG 24 hr tablet; Take 1 tablet (150 mg total) by mouth in the morning.  Birth control counseling -     etonogestrel-ethinyl estradiol (NUVARING) 0.12-0.015 MG/24HR vaginal ring; Insert vaginally and leave in place for 3 consecutive weeks, then remove for 1 week. -     buPROPion (WELLBUTRIN XL) 150 MG 24 hr tablet; Take 1 tablet (150 mg total) by mouth in the morning.   PHQ/GAD numbers are not good.  Start wellbutrin to see if will help with mood, focus, weight.  Encouraged regular exercise and mediatation.  Stop mini pill and go on regular birth control for pregnancy prevention since stopped BF.  Follow up in 3 months.

## 2021-03-08 DIAGNOSIS — Z3009 Encounter for other general counseling and advice on contraception: Secondary | ICD-10-CM | POA: Insufficient documentation

## 2021-04-02 ENCOUNTER — Telehealth: Payer: Self-pay | Admitting: Physician Assistant

## 2021-04-02 MED ORDER — METHYLPREDNISOLONE 4 MG PO TBPK: ORAL_TABLET | ORAL | 0 refills | Status: DC

## 2021-04-02 MED ORDER — HYDROXYZINE PAMOATE 25 MG PO CAPS: 25.0000 mg | ORAL_CAPSULE | Freq: Three times a day (TID) | ORAL | 0 refills | Status: DC | PRN

## 2021-04-02 NOTE — Telephone Encounter (Signed)
Hives and erythematous itching rash strted last night before migraine worsened throughout the day. No new foods, detergents, medication. No trouble breathing or swallowing.   Sent hydroxyzine if needed.  Medrol dose pak if worsens.

## 2021-04-06 ENCOUNTER — Encounter: Payer: Self-pay | Admitting: Physician Assistant

## 2021-04-06 ENCOUNTER — Ambulatory Visit (INDEPENDENT_AMBULATORY_CARE_PROVIDER_SITE_OTHER): Payer: No Typology Code available for payment source | Admitting: Physician Assistant

## 2021-04-06 ENCOUNTER — Other Ambulatory Visit: Payer: Self-pay

## 2021-04-06 VITALS — BP 105/62 | HR 89

## 2021-04-06 DIAGNOSIS — L509 Urticaria, unspecified: Secondary | ICD-10-CM | POA: Diagnosis not present

## 2021-04-06 DIAGNOSIS — G43011 Migraine without aura, intractable, with status migrainosus: Secondary | ICD-10-CM | POA: Insufficient documentation

## 2021-04-06 MED ORDER — KETOROLAC TROMETHAMINE 60 MG/2ML IM SOLN
60.0000 mg | Freq: Once | INTRAMUSCULAR | Status: AC
  Administered 2021-04-06: 60 mg via INTRAMUSCULAR

## 2021-04-06 MED ORDER — DEXAMETHASONE SODIUM PHOSPHATE 10 MG/ML IJ SOLN
10.0000 mg | Freq: Once | INTRAMUSCULAR | Status: AC
  Administered 2021-04-06: 10 mg via INTRAMUSCULAR

## 2021-04-06 NOTE — Patient Instructions (Signed)
Hives °Hives (urticaria) are itchy, red, swollen areas on the skin. Hives can appear on any part of the body. Hives often fade within 24 hours (acute hives). Sometimes, new hives appear after old ones fade and the cycle can continue for several days or weeks (chronic hives). Hives do not spread from person to person (are not contagious). °Hives come from the body's reaction to something a person is allergic to (allergen), something that causes irritation, or various other triggers. When a person is exposed to a trigger, his or her body releases a chemical (histamine) that causes redness, itching, and swelling. Hives can appear right after exposure to a trigger or hours later. °What are the causes? °This condition may be caused by: °Allergies to foods or ingredients. °Insect bites or stings. °Exposure to pollen or pets. °Spending time in sunlight, heat, or cold (exposure). °Exercise. °Stress. °You can also get hives from other medical conditions and treatments, such as: °Viruses, including the common cold. °Bacterial infections, such as urinary tract infections and strep throat. °Certain medicines. °Contact with latex or chemicals. °Allergy shots. °Blood transfusions. °Sometimes, the cause of this condition is not known (idiopathic hives). °What increases the risk? °You are more likely to develop this condition if you: °Are a woman. °Have food allergies, especially to citrus fruits, milk, eggs, peanuts, tree nuts, or shellfish. °Are allergic to: °Medicines. °Latex. °Insects. °Animals. °Pollen. °What are the signs or symptoms? °Common symptoms of this condition include raised, itchy, red or white bumps or patches on your skin. These areas may: °Become large and swollen (welts). °Change in shape and location, quickly and repeatedly. °Be separate hives or connect over a large area of skin. °Sting or become painful. °Turn white when pressed in the center (blanch). °In severe cases, your hands, feet, and face may also  become swollen. This may occur if hives develop deeper in your skin. °How is this diagnosed? °This condition may be diagnosed by your symptoms, medical history, and physical exam. °Your skin, urine, or blood may be tested to find out what is causing your hives and to rule out other health issues. °Your health care provider may also remove a small sample of skin from the affected area and examine it under a microscope (biopsy). °How is this treated? °Treatment for this condition depends on the cause and severity of your symptoms. Your health care provider may recommend using cool, wet cloths (cool compresses) or taking cool showers to relieve itching. Treatment may include: °Medicines that help: °Relieve itching (antihistamines). °Reduce swelling (corticosteroids). °Treat infection (antibiotics). °An injectable medicine (omalizumab). Your health care provider may prescribe this if you have chronic idiopathic hives and you continue to have symptoms even after treatment with antihistamines. °Severe cases may require an emergency injection of adrenaline (epinephrine) to prevent a life-threatening allergic reaction (anaphylaxis). °Follow these instructions at home: °Medicines °Take and apply over-the-counter and prescription medicines only as told by your health care provider. °If you were prescribed an antibiotic medicine, take it as told by your health care provider. Do not stop using the antibiotic even if you start to feel better. °Skin care °Apply cool compresses to the affected areas. °Do not scratch or rub your skin. °General instructions °Do not take hot showers or baths. This can make itching worse. °Do not wear tight-fitting clothing. °Use sunscreen and wear protective clothing when you are outside. °Avoid any substances that cause your hives. Keep a journal to help track what causes your hives. Write down: °What medicines you take. °  What you eat and drink. °What products you use on your skin. °Keep all  follow-up visits as told by your health care provider. This is important. °Contact a health care provider if: °Your symptoms are not controlled with medicine. °Your joints are painful or swollen. °Get help right away if: °You have a fever. °You have pain in your abdomen. °Your tongue or lips are swollen. °Your eyelids are swollen. °Your chest or throat feels tight. °You have trouble breathing or swallowing. °These symptoms may represent a serious problem that is an emergency. Do not wait to see if the symptoms will go away. Get medical help right away. Call your local emergency services (911 in the [place] Do not drive yourself to the hospital. °Summary °Hives (urticaria) are itchy, red, swollen areas on your skin. Hives come from the body's reaction to something a person is allergic to (allergen), something that causes irritation, or various other triggers. °Treatment for this condition depends on the cause and severity of your symptoms. °Avoid any substances that cause your hives. Keep a journal to help track what causes your hives. °Take and apply over-the-counter and prescription medicines only as told by your health care provider. °Get help right away if your chest or throat feels tight or if you have trouble breathing or swallowing. °This information is not intended to replace advice given to you by your health care provider. Make sure you discuss any questions you have with your health care provider. °Document Revised: 03/29/2020 Document Reviewed: 03/29/2020 °Elsevier Patient Education © 2022 [employer] °

## 2021-04-06 NOTE — Progress Notes (Signed)
Subjective:    Patient ID: Vanessa Winters, female    DOB: 1996-01-12, 26 y.o.   MRN: [id number]  HPI Pt is a 26 yo female who presents to the clinic with waxing Vanessa waning migraine since Thursday of last week, 6 days. Vanessa has taken maxalt daily Vanessa at times multiple times a day. It does help but has not stopped them from coming back. Migraine is located over the left temple. Vanessa suspects it is weather related due to the cold Vanessa hot then raining mix of weather we have gotten. No fever, chills, sinus pressure, ST, vision changes.   Vanessa did get a hives Thursday Vanessa Vanessa was itchy. Vanessa took benadryl Vanessa went to bed. Friday hives worsened. Vanessa took more benadryl Vanessa pepcid. Over Saturday hives did improve but very itchy. Pt did not use the prednisone sent. Denies any new medications, lotions, foods. No problems breathing or will throat swelling up.  .. Active Ambulatory Problems    Diagnosis Date Noted   Anxiety 07/25/2018   Bipolar 2 disorder ([place]) 06/13/2019   Attention deficit hyperactivity disorder (ADHD), combined type 01/08/2020   Encounter for induction of labor 05/13/2020   SVD (spontaneous vaginal delivery) 05/13/2020   Postpartum care following vaginal delivery 3/23 05/13/2020   Perineal laceration, second degree 05/13/2020   Birth control counseling 03/08/2021   Hives 04/06/2021   Intractable migraine without aura Vanessa with status migrainosus 04/06/2021   Resolved Ambulatory Problems    Diagnosis Date Noted   Hyperlipidemia 05/22/2011   History of migraine headaches 02/18/2013   Irregular menses 02/18/2013   Hidradenitis axillaris 11/07/2014   Discharge from right nipple 05/19/2016   Tobacco abuse 07/25/2018   Obsessive-compulsive behavior 07/25/2018   No energy 07/25/2018   Intractable migraine with aura without status migrainosus 07/25/2018   Trouble in sleeping 03/04/2019   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 03/04/2019    Arthralgia 03/04/2019   Hot flashes 03/04/2019   Nicotine withdrawal 03/13/2019   Fidgeting 04/01/2019   Inattention 04/01/2019   Hypoactive bowel sounds 04/10/2019   History of colitis 04/10/2019   Left lower quadrant pain 04/10/2019   Constipation 04/10/2019   Mood changes 04/30/2019   Family history of bipolar disorder 04/30/2019   Urinary incontinence without sensory awareness 06/03/2019   Brain fog 06/03/2019   Frequent headaches 06/03/2019   Dizziness 06/03/2019   Tachycardia 06/03/2019   Vision changes 06/03/2019   Near syncope 06/03/2019   Other symptoms Vanessa signs involving the nervous system 06/03/2019   Mixed obsessional thoughts Vanessa acts 06/13/2019   Right knee pain 08/05/2019   Hypotension 08/14/2019   Low serum vitamin B12 08/23/2019   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     Objective:   Physical Exam Vitals reviewed.  Constitutional:      Appearance: Normal appearance.  HENT:     Head: Normocephalic.  Cardiovascular:     Rate Vanessa Rhythm: Normal rate Vanessa regular rhythm.     Pulses: Normal pulses.     Heart sounds: Normal heart sounds.  Pulmonary:     Effort: Pulmonary effort is normal.     Breath sounds: Normal breath sounds.  Musculoskeletal:     Cervical back: No tenderness.  Right lower leg: No edema.     Left lower leg: No edema.  Lymphadenopathy:     Cervical: No cervical adenopathy.  Skin:    Comments: Rash absent  Neurological:     General: No focal deficit present.     Mental Status: Vanessa Winters, Vanessa Winters, Vanessa Winters.  Psychiatric:        Mood Vanessa Affect: Mood normal.          Assessment & Plan:  Marland KitchenMarland KitchenMckenah was seen today for migraine.  Diagnoses Vanessa all orders for this visit:  Intractable  migraine without aura Vanessa with status migrainosus -     ketorolac (TORADOL) injection 60 mg -     dexamethasone (DECADRON) injection 10 mg  Hives -     TSH -     CBC with Differential/Platelet -     COMPLETE METABOLIC PANEL WITH GFR -     IgE + Allergens (22)   Will get some IGE testing with CBC, CMP, TSH.  Toradol Vanessa dexamethasone for migraine cocktail.  Follow up as needed or if symptoms process or change. Ok to use benadryl for skin rash. Call with any breathing issues.

## 2021-04-07 LAB — COMPLETE METABOLIC PANEL WITH GFR
AG Ratio: 1.6 (calc) (ref 1.0–2.5)
ALT: 20 U/L (ref 6–29)
AST: 15 U/L (ref 10–30)
Albumin: 4.1 g/dL (ref 3.6–5.1)
Alkaline phosphatase (APISO): 68 U/L (ref 31–125)
BUN: 9 mg/dL (ref 7–25)
CO2: 24 mmol/L (ref 20–32)
Calcium: 9.2 mg/dL (ref 8.6–10.2)
Chloride: 109 mmol/L (ref 98–110)
Creat: 0.76 mg/dL (ref 0.50–0.96)
Globulin: 2.5 g/dL (calc) (ref 1.9–3.7)
Glucose, Bld: 84 mg/dL (ref 65–99)
Potassium: 4 mmol/L (ref 3.5–5.3)
Sodium: 140 mmol/L (ref 135–146)
Total Bilirubin: 0.4 mg/dL (ref 0.2–1.2)
Total Protein: 6.6 g/dL (ref 6.1–8.1)
eGFR: 110 mL/min/{1.73_m2} (ref >=60)

## 2021-04-07 LAB — CBC WITH DIFFERENTIAL/PLATELET
Absolute Monocytes: 332 cells/uL (ref 200–950)
Basophils Absolute: 8 cells/uL (ref 0–200)
Basophils Relative: 0.1 %
Eosinophils Absolute: 49 cells/uL (ref 15–500)
Eosinophils Relative: 0.6 %
HCT: 39 % (ref 35.0–45.0)
Hemoglobin: 13 g/dL (ref 11.7–15.5)
Lymphs Abs: 2438 cells/uL (ref 850–3900)
MCH: 30 pg (ref 27.0–33.0)
MCHC: 33.3 g/dL (ref 32.0–36.0)
MCV: 90.1 fL (ref 80.0–100.0)
MPV: 10.3 fL (ref 7.5–12.5)
Monocytes Relative: 4.1 %
Neutro Abs: 5273 cells/uL (ref 1500–7800)
Neutrophils Relative %: 65.1 %
Platelets: 302 10*3/uL (ref 140–400)
RBC: 4.33 10*6/uL (ref 3.80–5.10)
RDW: 13.1 % (ref 11.0–15.0)
Total Lymphocyte: 30.1 %
WBC: 8.1 10*3/uL (ref 3.8–10.8)

## 2021-04-07 LAB — TSH: TSH: 0.76 mIU/L

## 2021-04-07 NOTE — Progress Notes (Signed)
Thyroid looks great.  Normal hemoglobin.  Normal white count.  Kidney, liver, glucose look good.

## 2021-04-11 LAB — IGE + ALLERGENS (22)
Alternaria Alternata IgE: <0.1 kU/L
Aspergillus Fumigatus IgE: <0.1 kU/L
Bermuda Grass IgE: <0.1 kU/L
Cat Dander IgE: <0.1 kU/L
Cedar, Mountain IgE: <0.1 kU/L
Cladosporium Herbarum IgE: <0.1 kU/L
Cockroach, German IgE: <0.1 kU/L
Cottonwood IgE: <0.1 kU/L
D Farinae IgE: <0.1 kU/L
D Pteronyssinus IgE: <0.1 kU/L
Dog Dander IgE: <0.1 kU/L
Elm, American IgE: <0.1 kU/L
F297-IgE Acacia Gum: <0.1 kU/L
IgE (Immunoglobulin E), Serum: 6 IU/mL (ref 6–495)
Johnson Grass IgE: <0.1 kU/L
Mugwort IgE Qn: <0.1 kU/L
Oak, White IgE: <0.1 kU/L
Penicillium Chrysogen IgE: <0.1 kU/L
Pigweed, Rough IgE: <0.1 kU/L
Ragweed, Short IgE: <0.1 kU/L
Rye Grass, Perennial IgE: <0.1 kU/L
T009-IgE Olive Tree: <0.1 kU/L
W011-IgE Thistle, Russian: <0.1 kU/L

## 2021-04-12 ENCOUNTER — Encounter: Payer: Self-pay | Admitting: Physician Assistant

## 2021-04-12 NOTE — Progress Notes (Signed)
No IGE detected but this is not an extensive list. Have you had any more hives since?

## 2021-05-10 ENCOUNTER — Other Ambulatory Visit (HOSPITAL_BASED_OUTPATIENT_CLINIC_OR_DEPARTMENT_OTHER): Payer: Self-pay

## 2021-05-10 ENCOUNTER — Other Ambulatory Visit: Payer: Self-pay | Admitting: Physician Assistant

## 2021-05-10 DIAGNOSIS — Z3009 Encounter for other general counseling and advice on contraception: Secondary | ICD-10-CM

## 2021-05-10 MED ORDER — ETONOGESTREL-ETHINYL ESTRADIOL 0.12-0.015 MG/24HR VA RING
VAGINAL_RING | VAGINAL | 3 refills | Status: DC
  Filled 2021-05-10: qty 3, fill #0
  Filled 2021-05-10: qty 3, 63d supply, fill #0
  Filled ????-??-??: fill #0

## 2021-05-10 MED ORDER — ETONOGESTREL-ETHINYL ESTRADIOL 0.12-0.015 MG/24HR VA RING
VAGINAL_RING | VAGINAL | 3 refills | Status: DC
  Filled 2021-05-10: qty 4, fill #0
  Filled 2021-05-18: qty 3, 84d supply, fill #0
  Filled 2021-05-18 – 2021-05-31 (×2): qty 4, 84d supply, fill #0
  Filled 2021-08-11: qty 4, 84d supply, fill #1

## 2021-05-10 NOTE — Addendum Note (Signed)
Addended bySilvio Pate on: 05/10/2021 10:49 AM ? ? Modules accepted: Orders ? ?

## 2021-05-10 NOTE — Progress Notes (Signed)
Quantity not updated on updated rx. Resent.  ?

## 2021-05-18 ENCOUNTER — Telehealth: Payer: Self-pay | Admitting: Neurology

## 2021-05-18 ENCOUNTER — Other Ambulatory Visit (HOSPITAL_BASED_OUTPATIENT_CLINIC_OR_DEPARTMENT_OTHER): Payer: Self-pay

## 2021-05-18 NOTE — Telephone Encounter (Signed)
Prior Authorization for [employer] submitted via covermymeds. Awaiting response. ? ?

## 2021-05-19 ENCOUNTER — Other Ambulatory Visit (HOSPITAL_BASED_OUTPATIENT_CLINIC_OR_DEPARTMENT_OTHER): Payer: Self-pay

## 2021-05-19 NOTE — Telephone Encounter (Signed)
Etonogestrel-Ethinyl Estradiol 0.12-0.015MG /24HR rings ?    ?Status: PA Response - Approved ?

## 2021-05-25 ENCOUNTER — Ambulatory Visit (INDEPENDENT_AMBULATORY_CARE_PROVIDER_SITE_OTHER): Payer: No Typology Code available for payment source

## 2021-05-25 ENCOUNTER — Telehealth: Payer: Self-pay | Admitting: Physician Assistant

## 2021-05-25 DIAGNOSIS — S99912A Unspecified injury of left ankle, initial encounter: Secondary | ICD-10-CM | POA: Diagnosis not present

## 2021-05-25 DIAGNOSIS — M25572 Pain in left ankle and joints of left foot: Secondary | ICD-10-CM | POA: Diagnosis not present

## 2021-05-25 IMAGING — DX DG ANKLE COMPLETE 3+V*L*
3 series · 3 of 3 positions shown · non-contrast
Comparison: None.

CLINICAL DATA: Injury.  Left lateral pain and swelling

EXAM:
LEFT ANKLE COMPLETE - 3+ VIEW

[ankle ap]
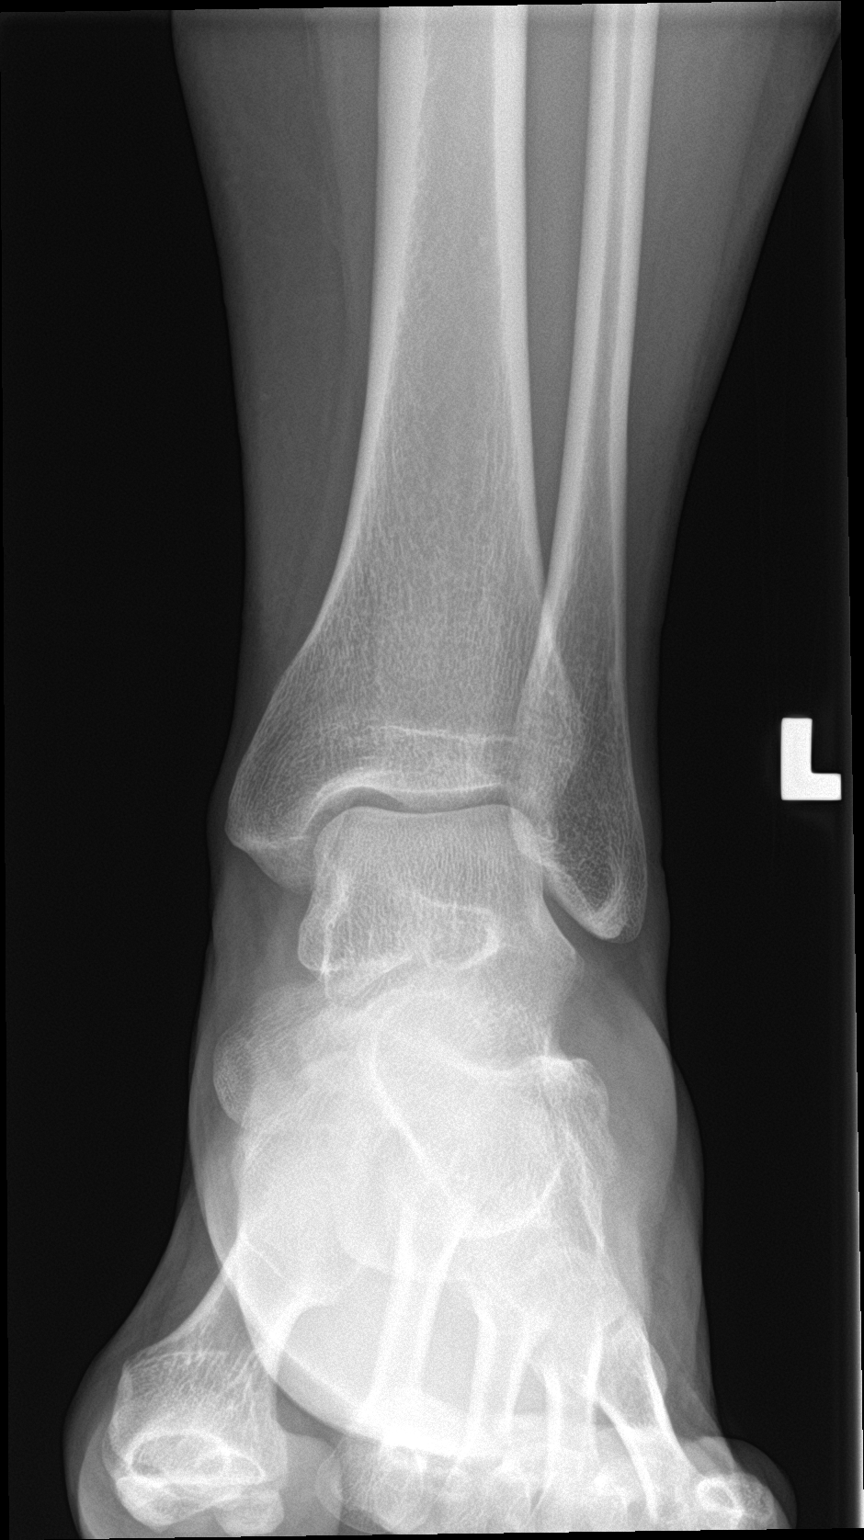

[ankle obl]
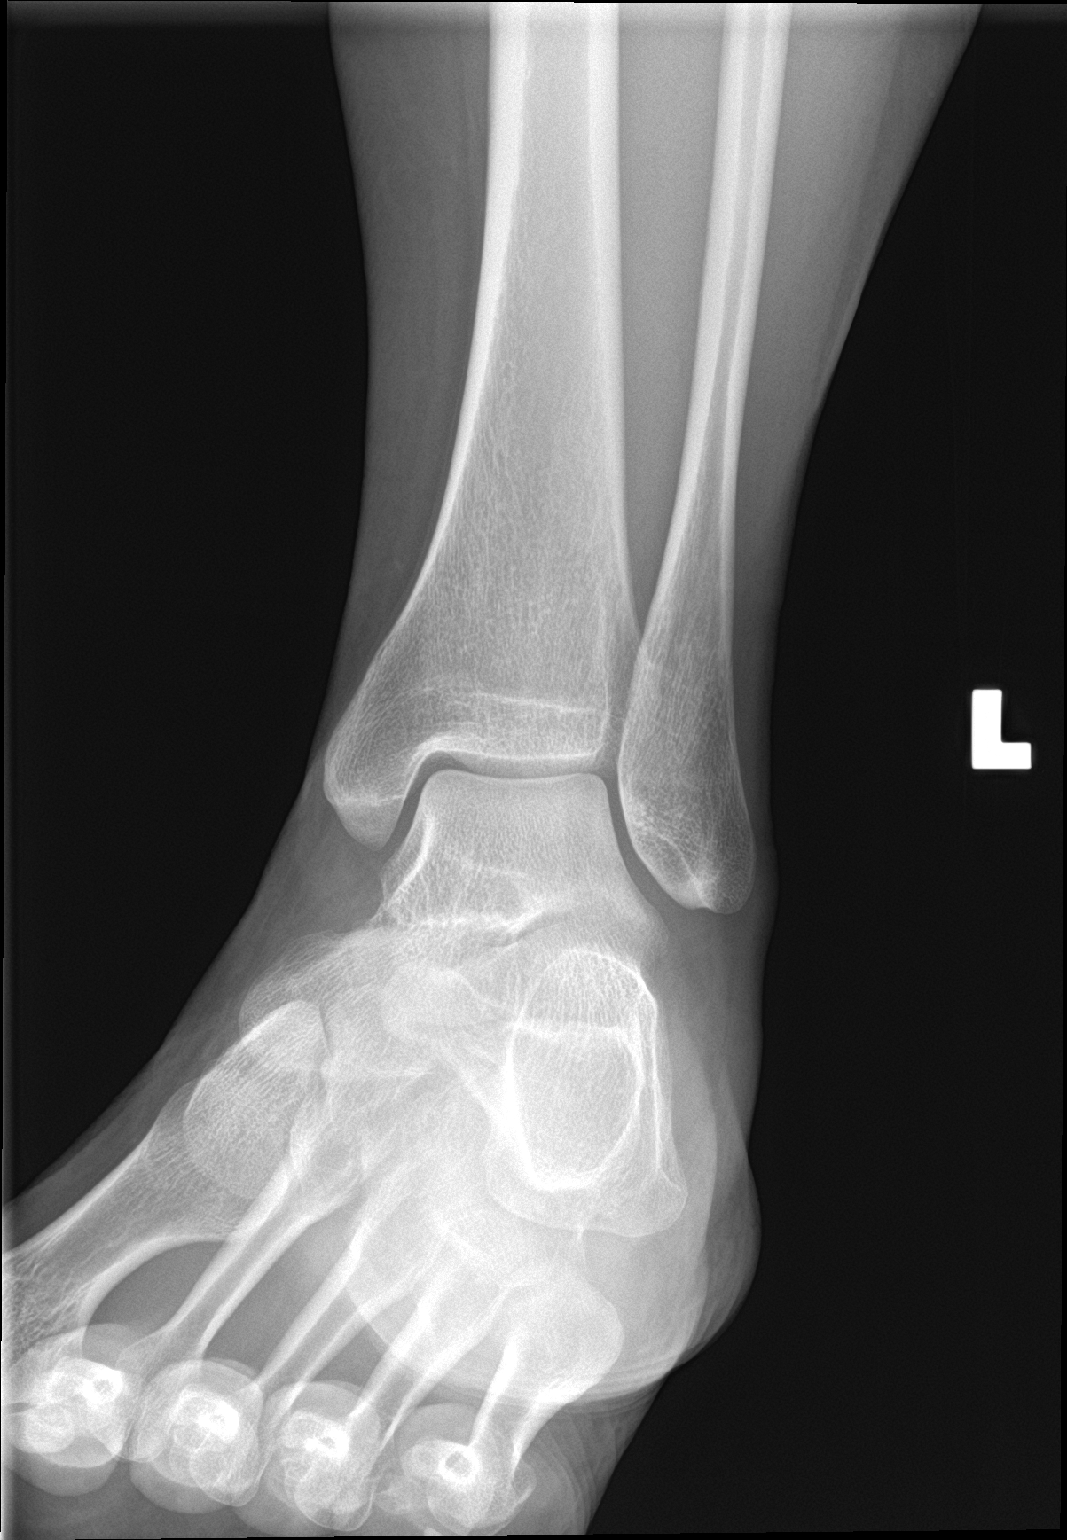

[ankle lat]
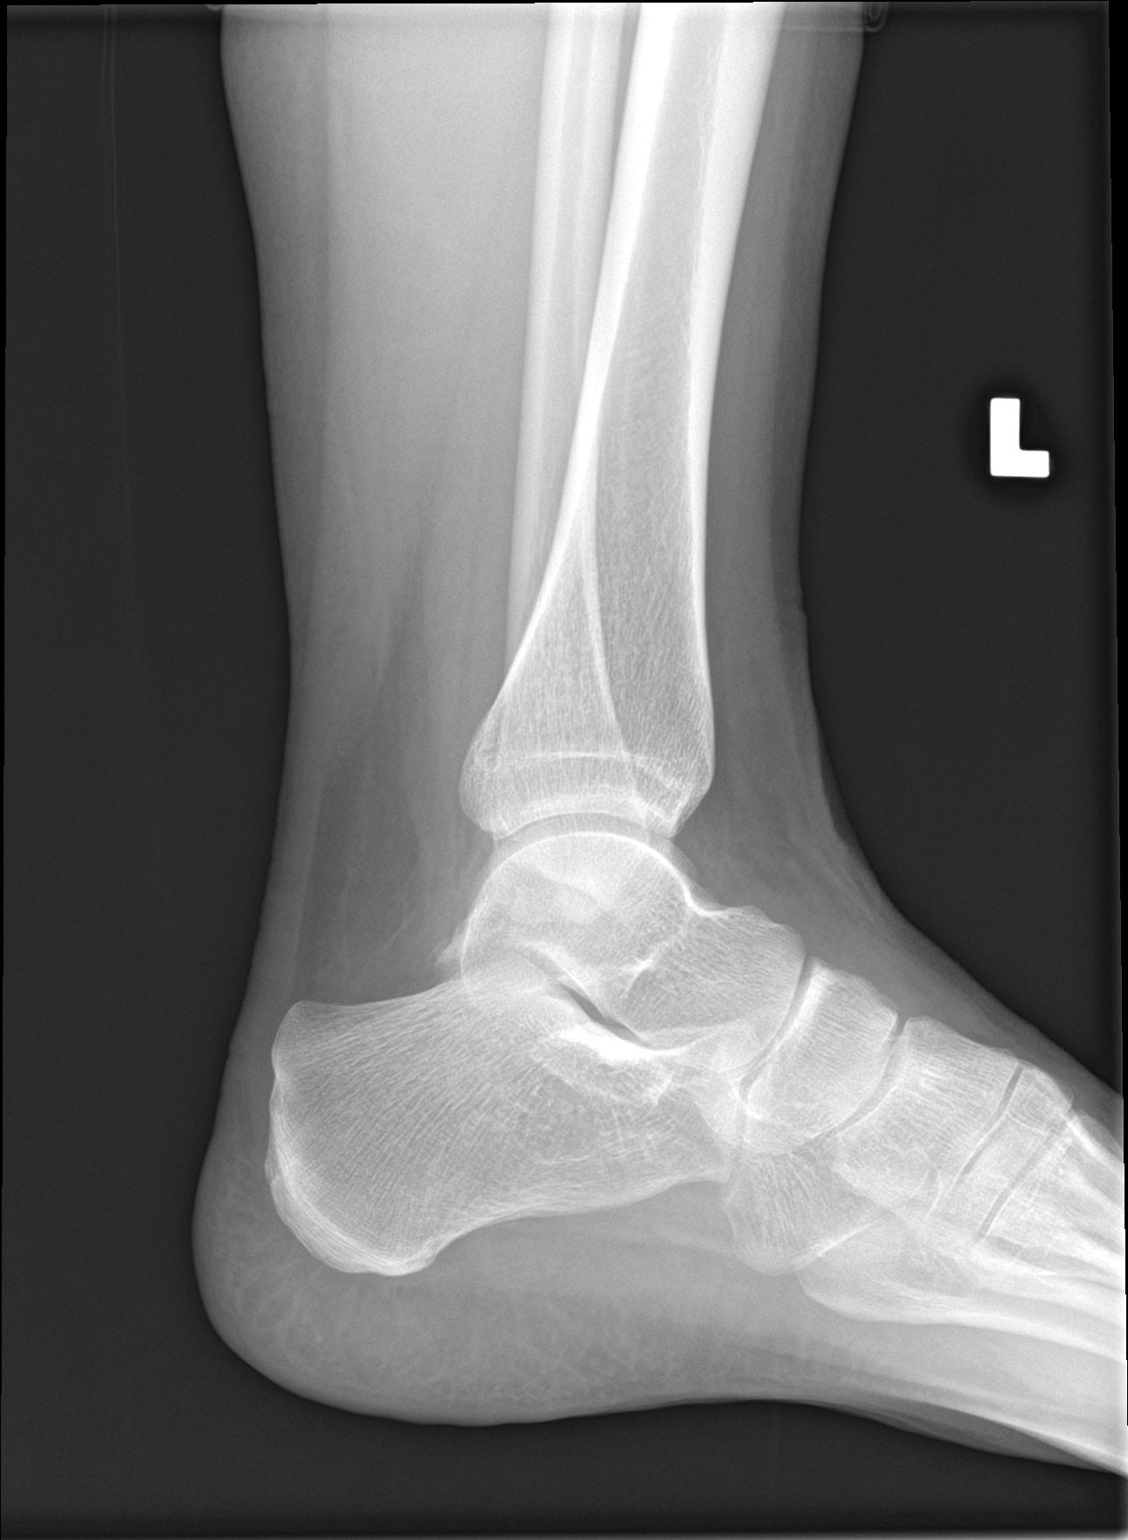

[3 of 3 positions shown; findings below may reference images not displayed]

FINDINGS: There is no evidence of fracture, dislocation, or joint effusion.
There is no evidence of arthropathy or other focal bone abnormality.
Soft tissues are unremarkable.
IMPRESSION: Negative.

## 2021-05-25 NOTE — Telephone Encounter (Signed)
Pt called in with left ankle injury last night.  ?

## 2021-05-25 NOTE — Progress Notes (Signed)
Discussed with patient no fracture and treatment for lateral ankle sprain with rest, ice, compression, elevation. Ok to wear boot since weight bearing is painful. Follow up in office tomorrow.

## 2021-05-26 ENCOUNTER — Ambulatory Visit (INDEPENDENT_AMBULATORY_CARE_PROVIDER_SITE_OTHER): Payer: No Typology Code available for payment source | Admitting: Physician Assistant

## 2021-05-26 VITALS — BP 130/74 | HR 117 | Resp 16 | Ht 61.0 in | Wt 140.0 lb

## 2021-05-26 DIAGNOSIS — M25572 Pain in left ankle and joints of left foot: Secondary | ICD-10-CM | POA: Diagnosis not present

## 2021-05-26 DIAGNOSIS — S93402D Sprain of unspecified ligament of left ankle, subsequent encounter: Secondary | ICD-10-CM

## 2021-05-26 DIAGNOSIS — S99912D Unspecified injury of left ankle, subsequent encounter: Secondary | ICD-10-CM

## 2021-05-28 ENCOUNTER — Other Ambulatory Visit (HOSPITAL_BASED_OUTPATIENT_CLINIC_OR_DEPARTMENT_OTHER): Payer: Self-pay

## 2021-05-30 ENCOUNTER — Encounter: Payer: Self-pay | Admitting: Physician Assistant

## 2021-05-30 DIAGNOSIS — M25572 Pain in left ankle and joints of left foot: Secondary | ICD-10-CM | POA: Insufficient documentation

## 2021-05-30 DIAGNOSIS — S99912A Unspecified injury of left ankle, initial encounter: Secondary | ICD-10-CM | POA: Insufficient documentation

## 2021-05-30 DIAGNOSIS — S93402A Sprain of unspecified ligament of left ankle, initial encounter: Secondary | ICD-10-CM | POA: Insufficient documentation

## 2021-05-30 NOTE — Progress Notes (Signed)
? ?  Subjective:  ? ? Patient ID: Vanessa Winters, female    DOB: 02/16/1996, 26 y.o.   MRN: [id number] ? ?HPI ?Pt presents to the clinic 1 day after left ankle injury where she rolled her ankle at home. She was holding her 26 year old and landed backwards. She thought she heard a pop on the lateral side. She is having lots of pain over her left lateral malleolus into her left lateral foot. She is having lots of swelling. She has iced and wearing her sisters cam boot. Very hard to put any weight on it.  ? ?Active Ambulatory Problems  ?  Diagnosis Date Noted  ? Anxiety 07/25/2018  ? Bipolar 2 disorder ([facility]) 06/13/2019  ? Attention deficit hyperactivity disorder (ADHD), combined type 01/08/2020  ? Encounter for induction of labor 05/13/2020  ? SVD (spontaneous vaginal delivery) 05/13/2020  ? Postpartum care following vaginal delivery 3/23 05/13/2020  ? Perineal laceration, second degree 05/13/2020  ? Birth control counseling 03/08/2021  ? Hives 04/06/2021  ? Intractable migraine without aura and with status migrainosus 04/06/2021  ? Acute left ankle pain 05/30/2021  ? Injury of left ankle 05/30/2021  ? Severe sprain of left ankle 05/30/2021  ? ?Resolved Ambulatory Problems  ?  Diagnosis Date Noted  ? Hyperlipidemia 05/22/2011  ? History of migraine headaches 02/18/2013  ? Irregular menses 02/18/2013  ? Hidradenitis axillaris 11/07/2014  ? Discharge from right nipple 05/19/2016  ? Tobacco abuse 07/25/2018  ? Obsessive-compulsive behavior 07/25/2018  ? No energy 07/25/2018  ? Intractable migraine with aura without status migrainosus 07/25/2018  ? Trouble in sleeping 03/04/2019  ? 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 Arthralgia 03/04/2019  ? Hot flashes 03/04/2019  ? Nicotine withdrawal 03/13/2019  ? Fidgeting 04/01/2019  ? Inattention 04/01/2019  ? Hypoactive bowel sounds 04/10/2019  ? History of colitis 04/10/2019  ? Left lower quadrant pain 04/10/2019  ? Constipation  04/10/2019  ? Mood changes 04/30/2019  ? Family history of bipolar disorder 04/30/2019  ? Urinary incontinence without sensory awareness 06/03/2019  ? Brain fog 06/03/2019  ? Frequent headaches 06/03/2019  ? Dizziness 06/03/2019  ? Tachycardia 06/03/2019  ? Vision changes 06/03/2019  ? Near syncope 06/03/2019  ? Other symptoms and signs involving the nervous system 06/03/2019  ? Mixed obsessional thoughts and acts 06/13/2019  ? Right knee pain 08/05/2019  ? Hypotension 08/14/2019  ? Low serum vitamin B12 08/23/2019  ? 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  ? Obsessive compulsive disorder 01/08/2020  ? ?Past Medical History:  ?Diagnosis Date  ? Colon polyps   ? GERD (gastroesophageal reflux disease)   ? Migraines   ? Seasonal allergies   ? ? ? ?Review of Systems ? ?  See HPI.  ?Objective:  ? Physical Exam ? ?Left foot tenderness, swelling, bruising over anterior talofibular ligament and into forefoot. Bruising continues just below malleous into forefoot.  ? ?Xray: no fracture.  ?   ?Assessment & Plan:  ?..Vanessa Winters was seen today for ankle sprain. ? ?Diagnoses and all orders for this visit: ? ?Severe sprain of left ankle, subsequent encounter ? ?Acute left ankle pain ? ?Injury of left ankle, subsequent encounter ? ? ?No fracture.  ?Discussed rest, ice, compression, elevation.  ?Ok to continue to wear cam boot or lace up ankle brace ?PT to start after 3 days of above treatment ?Follow up as needed or if symptoms worsening.  ? ?

## 2021-05-31 ENCOUNTER — Other Ambulatory Visit (HOSPITAL_BASED_OUTPATIENT_CLINIC_OR_DEPARTMENT_OTHER): Payer: Self-pay

## 2021-06-11 ENCOUNTER — Other Ambulatory Visit: Payer: Self-pay | Admitting: Physician Assistant

## 2021-06-11 ENCOUNTER — Other Ambulatory Visit (HOSPITAL_BASED_OUTPATIENT_CLINIC_OR_DEPARTMENT_OTHER): Payer: Self-pay

## 2021-06-11 DIAGNOSIS — G43011 Migraine without aura, intractable, with status migrainosus: Secondary | ICD-10-CM

## 2021-06-11 MED ORDER — RIZATRIPTAN BENZOATE 10 MG PO TABS
10.0000 mg | ORAL_TABLET | ORAL | 3 refills | Status: DC | PRN
  Filled 2021-06-11: qty 10, 17d supply, fill #0
  Filled 2021-08-11: qty 10, 17d supply, fill #1
  Filled 2021-11-08: qty 10, 17d supply, fill #2
  Filled 2021-12-27: qty 10, 17d supply, fill #3

## 2021-06-22 ENCOUNTER — Other Ambulatory Visit (HOSPITAL_BASED_OUTPATIENT_CLINIC_OR_DEPARTMENT_OTHER): Payer: Self-pay

## 2021-06-22 ENCOUNTER — Encounter: Payer: Self-pay | Admitting: Physician Assistant

## 2021-06-22 MED ORDER — MEDROXYPROGESTERONE ACETATE 10 MG PO TABS
10.0000 mg | ORAL_TABLET | Freq: Every day | ORAL | 0 refills | Status: DC
  Filled 2021-06-22: qty 5, 5d supply, fill #0

## 2021-07-06 ENCOUNTER — Encounter: Payer: Self-pay | Admitting: Physician Assistant

## 2021-07-07 ENCOUNTER — Other Ambulatory Visit: Payer: Self-pay | Admitting: Physician Assistant

## 2021-07-07 DIAGNOSIS — S93402D Sprain of unspecified ligament of left ankle, subsequent encounter: Secondary | ICD-10-CM

## 2021-07-07 DIAGNOSIS — S99912D Unspecified injury of left ankle, subsequent encounter: Secondary | ICD-10-CM

## 2021-07-10 ENCOUNTER — Ambulatory Visit (INDEPENDENT_AMBULATORY_CARE_PROVIDER_SITE_OTHER): Payer: No Typology Code available for payment source

## 2021-07-10 DIAGNOSIS — W19XXXD Unspecified fall, subsequent encounter: Secondary | ICD-10-CM

## 2021-07-10 DIAGNOSIS — S93402D Sprain of unspecified ligament of left ankle, subsequent encounter: Secondary | ICD-10-CM

## 2021-07-10 DIAGNOSIS — S99912D Unspecified injury of left ankle, subsequent encounter: Secondary | ICD-10-CM | POA: Diagnosis not present

## 2021-07-10 IMAGING — MR MR FOOT*L* W/O CM
5 series · 40 of 40 positions shown · non-contrast
Comparison: None Available.

CLINICAL DATA: Foot pain.  Status post fall [DATE]

EXAM:
MRI OF THE LEFT FOOT WITHOUT CONTRAST
TECHNIQUE: Multiplanar, multisequence MR imaging of the left foot was
performed. No intravenous contrast was administered.

[Series 3: T2 fat-sat · axial · 3.0mm · 0.70mm/px · z∈[-107,+9]mm · 7 of 36 slices shown (1 of 2)]
[im 1/36]
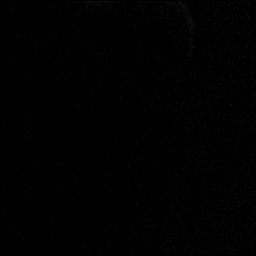
[im 6/36]
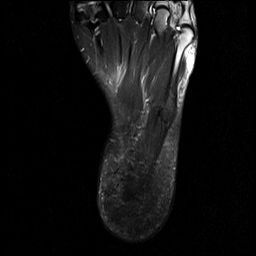
[im 12/36]
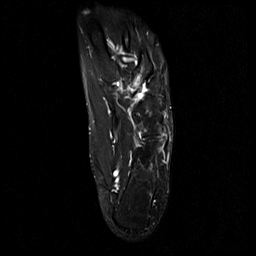
[im 18/36]
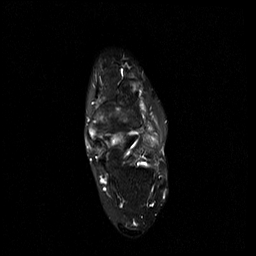
[im 24/36]
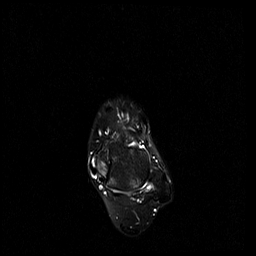
[im 30/36]
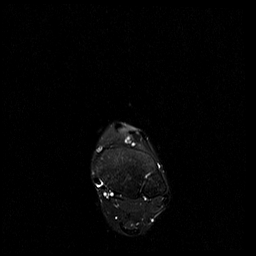
[im 36/36]
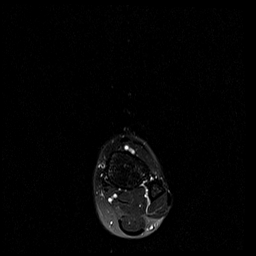

[Series 4: T2 fat-sat · coronal · 3.0mm · 0.70mm/px · 10 of 42 slices shown (2 of 2)]
[im 1/42]
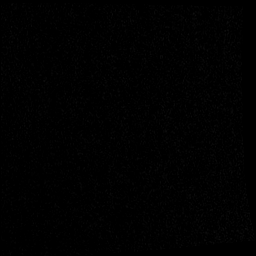
[im 5/42]
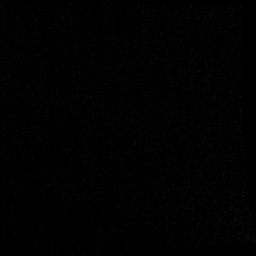
[im 10/42]
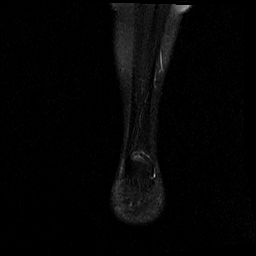
[im 14/42]
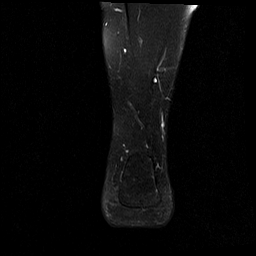
[im 19/42]
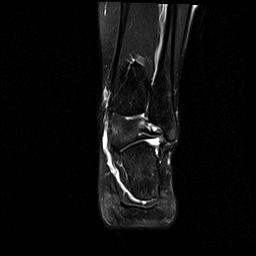
[im 23/42]
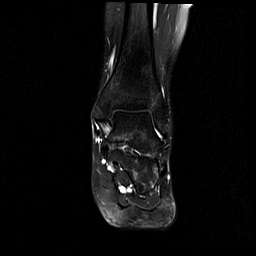
[im 28/42]
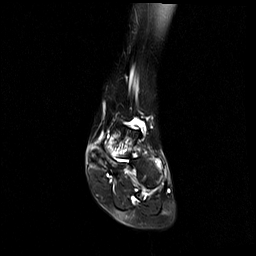
[im 32/42]
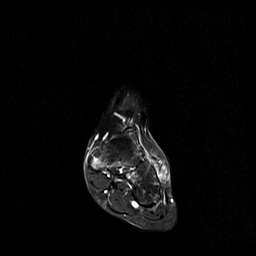
[im 37/42]
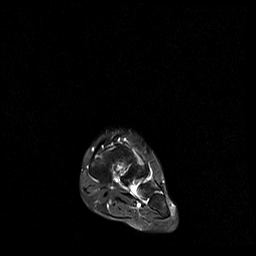
[im 42/42]
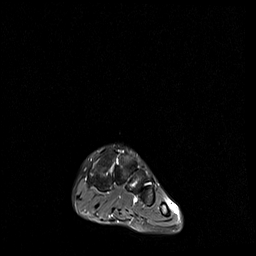

[Series 5: T1 · sagittal · 3.0mm · 0.56mm/px · 7 of 28 slices shown]
[im 1/28]
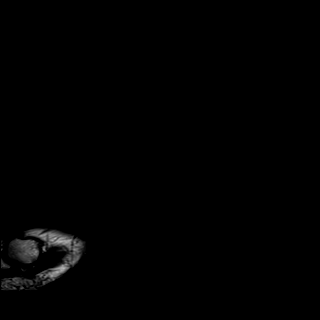
[im 5/28]
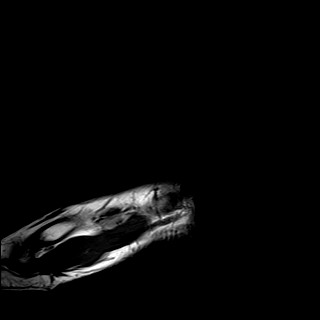
[im 10/28]
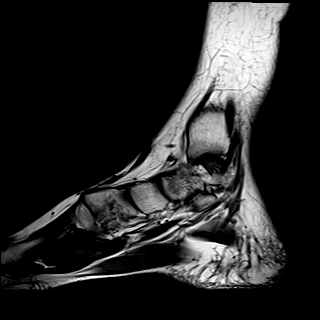
[im 14/28]
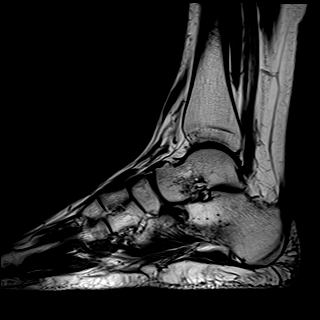
[im 19/28]
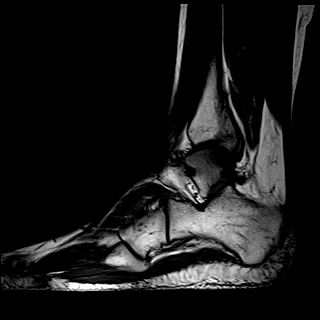
[im 23/28]
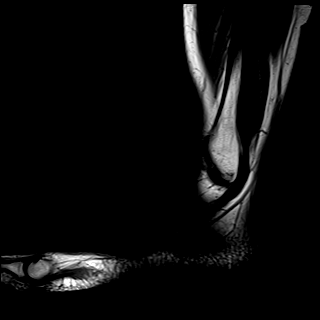
[im 28/28]
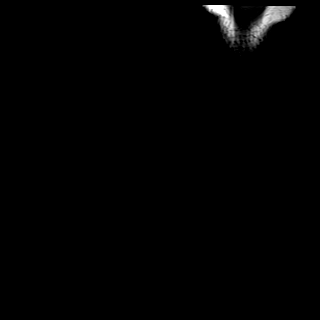

[Series 6: PD fat-sat · axial · 3.0mm · 0.70mm/px · z∈[-110,+9]mm · 9 of 37 slices shown]
[im 1/37]
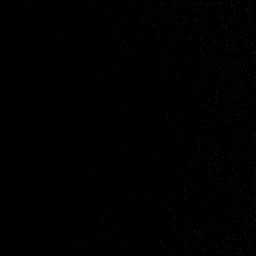
[im 5/37]
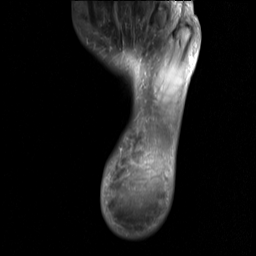
[im 10/37]
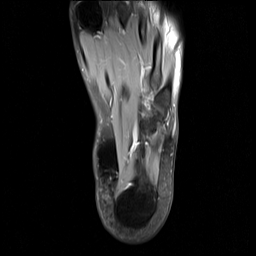
[im 14/37]
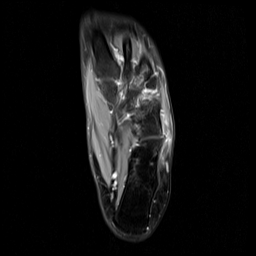
[im 19/37]
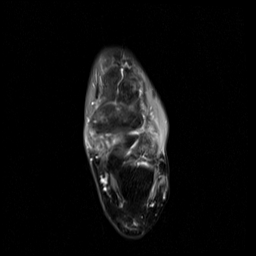
[im 23/37]
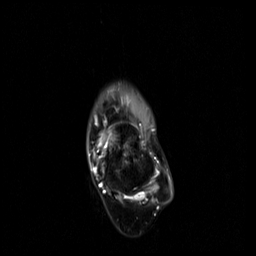
[im 28/37]
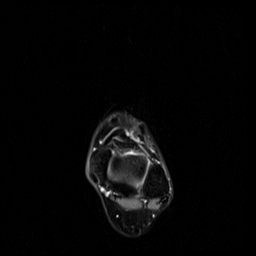
[im 32/37]
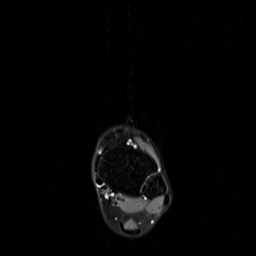
[im 37/37]
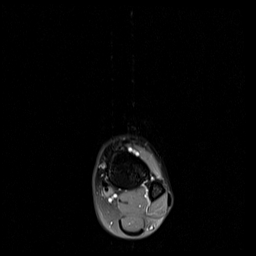

[Series 7: STIR · sagittal · 3.0mm · 0.70mm/px · 7 of 28 slices shown]
[im 1/28]
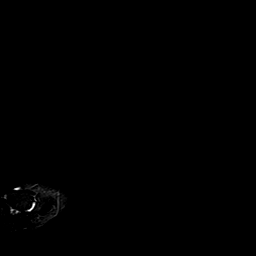
[im 5/28]
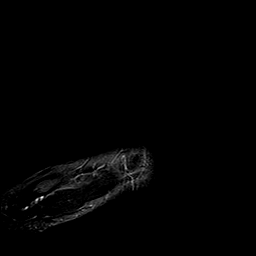
[im 10/28]
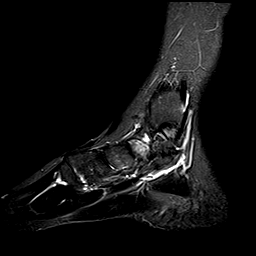
[im 14/28]
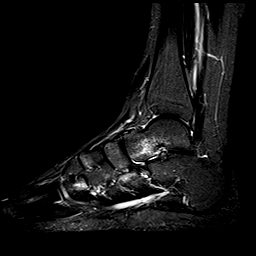
[im 19/28]
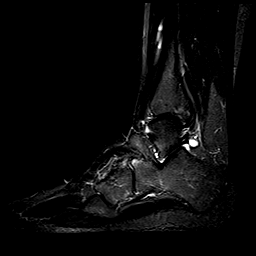
[im 23/28]
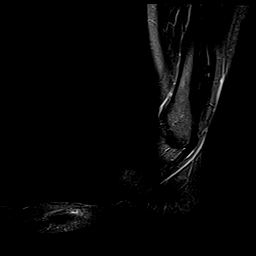
[im 28/28]
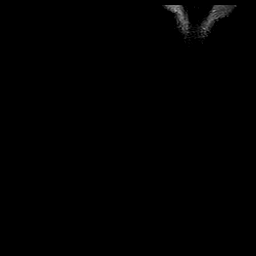

[40 of 40 positions shown; findings below may reference images not displayed]

FINDINGS: TENDONS

Peroneal: Mild tendinosis of the peroneus longus. Peroneal brevis
intact.

Posteromedial: Mild tendinosis the posterior tibial tendon with
tenosynovitis and a partial-thickness tear proximal to the
navicular. Flexor hallucis longus tendon intact. Flexor digitorum
longus tendon intact.

Anterior: Tibialis anterior tendon intact. Extensor hallucis longus
tendon intact Extensor digitorum longus tendon intact.

Achilles:  Intact.

Plantar Fascia: Intact.

LIGAMENTS

Lateral: Anterior talofibular ligament intact. Calcaneofibular
ligament intact. Posterior talofibular ligament intact. Anterior and
posterior tibiofibular ligaments intact.

Medial: Deltoid ligament intact. Spring ligament intact.

CARTILAGE

Ankle Joint: No joint effusion. Normal ankle mortise. No chondral
defect.

Subtalar Joints/Sinus Tarsi: Normal subtalar joints. No subtalar
joint effusion. Normal sinus tarsi.

Bones: No aggressive osseous lesion. No fracture or dislocation.
Osseous contusions of the anterior talus, anterior calcaneus,
navicular, cuboid, and base of the third metatarsal.

Soft Tissue: No fluid collection or hematoma. Muscles are normal
without edema or atrophy. Tarsal tunnel is normal.
IMPRESSION: 1. Mild tendinosis the posterior tibial tendon with tenosynovitis
and a partial-thickness tear proximal to the navicular.
2. Mild tendinosis of the peroneus longus.
3. Osseous contusions of the anterior talus, anterior calcaneus,
navicular, cuboid, and base of the third metatarsal.

## 2021-07-12 ENCOUNTER — Encounter: Payer: Self-pay | Admitting: Physician Assistant

## 2021-07-12 ENCOUNTER — Ambulatory Visit (INDEPENDENT_AMBULATORY_CARE_PROVIDER_SITE_OTHER): Payer: No Typology Code available for payment source | Admitting: Physician Assistant

## 2021-07-12 DIAGNOSIS — M7672 Peroneal tendinitis, left leg: Secondary | ICD-10-CM

## 2021-07-12 DIAGNOSIS — S93402D Sprain of unspecified ligament of left ankle, subsequent encounter: Secondary | ICD-10-CM

## 2021-07-12 DIAGNOSIS — S86112D Strain of other muscle(s) and tendon(s) of posterior muscle group at lower leg level, left leg, subsequent encounter: Secondary | ICD-10-CM

## 2021-07-12 DIAGNOSIS — M76822 Posterior tibial tendinitis, left leg: Secondary | ICD-10-CM

## 2021-07-12 DIAGNOSIS — S93402S Sprain of unspecified ligament of left ankle, sequela: Secondary | ICD-10-CM

## 2021-07-12 NOTE — Patient Instructions (Signed)
Limited weight bearing/cam boot for 4 weeks

## 2021-07-12 NOTE — Progress Notes (Signed)
Called patient: she had her sisters boot but her sister needed it back. She needs to be fitted for a new boot. She is going to come in today. Boot for 4 weeks then follow up with Dr. Darene Lamer. If pain while walking in boot let me know. Minimal walking even in boot.

## 2021-07-12 NOTE — Progress Notes (Signed)
Will you look at this MRI?  Injury 05/25/2021 She wore a cam boot for 2 weeks then transitioned to ankle lace up brace she was still having pain with bearing weight so got MRI?  Just get her back in the boot again and see you or do you think she needs more immobilization?

## 2021-07-12 NOTE — Progress Notes (Unsigned)
Acute Office Visit  Subjective:     Patient ID: Vanessa Winters, female    DOB: Apr 27, 1995, 26 y.o.   MRN: [id number]  Chief Complaint  Patient presents with   Foot Injury    HPI Patient is in today to go over results of MRI of left foot after injury. 05/25/2021.   Wore a boot for 2 weeks then lace up ankle brace. Continues to have pain in left dorsal, medial, lateral foot.   .. 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   Encounter for induction of labor 05/13/2020   SVD (spontaneous vaginal delivery) 05/13/2020   Postpartum care following vaginal delivery 3/23 05/13/2020   Perineal laceration, second degree 05/13/2020   Birth control counseling 03/08/2021   Hives 04/06/2021   Intractable migraine without aura and with status migrainosus 04/06/2021   Acute left ankle pain 05/30/2021   Injury of left ankle 05/30/2021   Severe sprain of left ankle 05/30/2021   Resolved Ambulatory Problems    Diagnosis Date Noted   Hyperlipidemia 05/22/2011   History of migraine headaches 02/18/2013   Irregular menses 02/18/2013   Hidradenitis axillaris 11/07/2014   Discharge from right nipple 05/19/2016   Tobacco abuse 07/25/2018   Obsessive-compulsive behavior 07/25/2018   No energy 07/25/2018   Intractable migraine with aura without status migrainosus 07/25/2018   Trouble in sleeping 03/04/2019   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 03/04/2019   Arthralgia 03/04/2019   Hot flashes 03/04/2019   Nicotine withdrawal 03/13/2019   Fidgeting 04/01/2019   Inattention 04/01/2019   Hypoactive bowel sounds 04/10/2019   History of colitis 04/10/2019   Left lower quadrant pain 04/10/2019   Constipation 04/10/2019   Mood changes 04/30/2019   Family history of bipolar disorder 04/30/2019   Urinary incontinence  without sensory awareness 06/03/2019   Brain fog 06/03/2019   Frequent headaches 06/03/2019   Dizziness 06/03/2019   Tachycardia 06/03/2019   Vision changes 06/03/2019   Near syncope 06/03/2019   Other symptoms and signs involving the nervous system 06/03/2019   Mixed obsessional thoughts and acts 06/13/2019   Right knee pain 08/05/2019   Hypotension 08/14/2019   Low serum vitamin B12 08/23/2019   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   Obsessive compulsive disorder 01/08/2020   Past Medical History:  Diagnosis Date   Colon polyps    GERD (gastroesophageal reflux disease)    Migraines    Seasonal allergies      ROS Left ankle pain     Objective:     Physical Exam  LEFT FOOT:  Mild swelling lateral foot just in front of lateral malleous with tenderness to palpation of posterior tibial and peroneus tendon and navicular bone. ROM of foot normal but with pain Decreased strength of left foot with resisted dorsiflexion     Assessment & Plan:  Marland KitchenMarland KitchenMakaylie was seen today for foot injury.  Diagnoses and all orders for this visit:  Severe sprain of left ankle, subsequent encounter  Posterior tibial tendinitis of left lower extremity  Peroneus longus tendinitis, left  Posterior tibial tendon tear, traumatic, left, subsequent encounter   Discussed MRI.  Consulted Dr. Karie Winters suggest immobilzation for 4 weeks and follow up with him. She does not have access to her sisters boot anymore and was fitted for her cam boot today Minimal weight bearing Follow up with Dr. Karie Winters in  4 weeks   IMPRESSION: 1. Mild tendinosis the posterior tibial tendon with tenosynovitis and a partial-thickness tear proximal to the navicular. 2. Mild tendinosis of the peroneus longus. 3. Osseous contusions of the anterior talus, anterior calcaneus, navicular, cuboid, and base of the third metatarsal.    Return in about 4 weeks (around 08/09/2021) for Dr. Karie Winters follow up left ankle.  Spent 30 minutes with  patient reviewing chart, images, treatment plan and being fitted for boot.   Tandy Gaw, PA-C

## 2021-07-13 DIAGNOSIS — M7672 Peroneal tendinitis, left leg: Secondary | ICD-10-CM | POA: Insufficient documentation

## 2021-07-13 DIAGNOSIS — S86112D Strain of other muscle(s) and tendon(s) of posterior muscle group at lower leg level, left leg, subsequent encounter: Secondary | ICD-10-CM | POA: Insufficient documentation

## 2021-07-13 DIAGNOSIS — M76822 Posterior tibial tendinitis, left leg: Secondary | ICD-10-CM | POA: Insufficient documentation

## 2021-08-12 ENCOUNTER — Other Ambulatory Visit (HOSPITAL_BASED_OUTPATIENT_CLINIC_OR_DEPARTMENT_OTHER): Payer: Self-pay

## 2021-08-23 ENCOUNTER — Ambulatory Visit: Payer: No Typology Code available for payment source | Admitting: Sports Medicine

## 2021-08-30 ENCOUNTER — Other Ambulatory Visit (HOSPITAL_BASED_OUTPATIENT_CLINIC_OR_DEPARTMENT_OTHER): Payer: Self-pay

## 2021-08-30 ENCOUNTER — Encounter: Payer: Self-pay | Admitting: Sports Medicine

## 2021-08-30 ENCOUNTER — Ambulatory Visit (INDEPENDENT_AMBULATORY_CARE_PROVIDER_SITE_OTHER): Payer: No Typology Code available for payment source | Admitting: Sports Medicine

## 2021-08-30 DIAGNOSIS — G8929 Other chronic pain: Secondary | ICD-10-CM | POA: Diagnosis not present

## 2021-08-30 DIAGNOSIS — M25572 Pain in left ankle and joints of left foot: Secondary | ICD-10-CM

## 2021-08-30 MED ORDER — MELOXICAM 15 MG PO TABS
ORAL_TABLET | ORAL | 3 refills | Status: DC
  Filled 2021-08-30: qty 30, 30d supply, fill #0

## 2021-08-30 NOTE — Progress Notes (Signed)
    Procedures performed today:    None.  Independent interpretation of notes and tests performed by another provider:   None.  Brief History, Exam, Impression, and Recommendations:    Left ankle pain Afua is a very pleasant 26 year old female, she is about 6 weeks post an inversion injury of her left ankle, had persistent pain, she was appropriately immobilized, x-rays did not show fractures however a foot MRI did show multiple bony contusions of her tarsals, she also had tibialis posterior tendinitis, peroneal tendinopathy, partial tearing of her tibialis posterior. On exam today her swelling has improved considerably, the dominant finding is discrete tenderness at the navicular prominence, but lesser so over the proximal tibialis posterior, there is reproduction of pain with resisted inversion. I think we are dealing with a navicular stress injury/chronic distal tibialis posterior tendinopathy, I think she should wear the boot for at least another 3 weeks, we will add meloxicam for 2 weeks, tibialis posterior conditioning, she will put scaphoid pads in all of her shoes as well as her boot. Return to see me in 4 to 6 weeks.  Chronic process with exacerbation and pharmacologic intervention  ____________________________________________ Ihor Austin. Benjamin Stain, M.D., ABFM., CAQSM., AME. Primary Care and Sports Medicine [facility]  Adjunct Professor of Family Medicine  [place] [employer]  Restaurant manager, fast food

## 2021-08-30 NOTE — Assessment & Plan Note (Signed)
Vanessa Winters is a very pleasant 26 year old female, she is about 6 weeks post an inversion injury of her left ankle, had persistent pain, she was appropriately immobilized, x-rays did not show fractures however a foot MRI did show multiple bony contusions of her tarsals, she also had tibialis posterior tendinitis, peroneal tendinopathy, partial tearing of her tibialis posterior. On exam today her swelling has improved considerably, the dominant finding is discrete tenderness at the navicular prominence, but lesser so over the proximal tibialis posterior, there is reproduction of pain with resisted inversion. I think we are dealing with a navicular stress injury/chronic distal tibialis posterior tendinopathy, I think she should wear the boot for at least another 3 weeks, we will add meloxicam for 2 weeks, tibialis posterior conditioning, she will put scaphoid pads in all of her shoes as well as her boot. Return to see me in 4 to 6 weeks.

## 2021-09-01 ENCOUNTER — Encounter: Payer: Self-pay | Admitting: Physician Assistant

## 2021-09-02 ENCOUNTER — Other Ambulatory Visit (HOSPITAL_BASED_OUTPATIENT_CLINIC_OR_DEPARTMENT_OTHER): Payer: Self-pay

## 2021-09-02 ENCOUNTER — Other Ambulatory Visit: Payer: Self-pay | Admitting: Physician Assistant

## 2021-09-02 MED ORDER — MEDROXYPROGESTERONE ACETATE 10 MG PO TABS
10.0000 mg | ORAL_TABLET | Freq: Every day | ORAL | 0 refills | Status: DC
  Filled 2021-09-02: qty 5, 5d supply, fill #0

## 2021-09-02 NOTE — Progress Notes (Signed)
DUB after leaving in nuva ring continually. Will reset and she follow 3 weeks in and 1 week out.

## 2021-09-03 ENCOUNTER — Encounter: Payer: No Typology Code available for payment source | Admitting: Physician Assistant

## 2021-10-06 ENCOUNTER — Encounter: Payer: Self-pay | Admitting: Neurology

## 2021-10-08 ENCOUNTER — Other Ambulatory Visit (HOSPITAL_BASED_OUTPATIENT_CLINIC_OR_DEPARTMENT_OTHER): Payer: Self-pay

## 2021-10-08 ENCOUNTER — Telehealth (INDEPENDENT_AMBULATORY_CARE_PROVIDER_SITE_OTHER): Payer: No Typology Code available for payment source | Admitting: Physician Assistant

## 2021-10-08 ENCOUNTER — Other Ambulatory Visit: Payer: Self-pay | Admitting: Physician Assistant

## 2021-10-08 ENCOUNTER — Encounter: Payer: Self-pay | Admitting: Physician Assistant

## 2021-10-08 DIAGNOSIS — Z3009 Encounter for other general counseling and advice on contraception: Secondary | ICD-10-CM

## 2021-10-08 DIAGNOSIS — N921 Excessive and frequent menstruation with irregular cycle: Secondary | ICD-10-CM | POA: Diagnosis not present

## 2021-10-08 DIAGNOSIS — F422 Mixed obsessional thoughts and acts: Secondary | ICD-10-CM | POA: Diagnosis not present

## 2021-10-08 DIAGNOSIS — F419 Anxiety disorder, unspecified: Secondary | ICD-10-CM | POA: Diagnosis not present

## 2021-10-08 DIAGNOSIS — R4589 Other symptoms and signs involving emotional state: Secondary | ICD-10-CM | POA: Diagnosis not present

## 2021-10-08 DIAGNOSIS — F902 Attention-deficit hyperactivity disorder, combined type: Secondary | ICD-10-CM

## 2021-10-08 DIAGNOSIS — F3181 Bipolar II disorder: Secondary | ICD-10-CM

## 2021-10-08 MED ORDER — BUPROPION HCL ER (XL) 150 MG PO TB24
150.0000 mg | ORAL_TABLET | Freq: Every morning | ORAL | 1 refills | Status: DC
  Filled 2021-10-08: qty 90, 90d supply, fill #0
  Filled 2022-01-04: qty 90, 90d supply, fill #1

## 2021-10-08 MED ORDER — NORETHIN ACE-ETH ESTRAD-FE 1-20 MG-MCG PO TABS
1.0000 | ORAL_TABLET | Freq: Every day | ORAL | 0 refills | Status: DC
  Filled 2021-10-08: qty 84, 84d supply, fill #0

## 2021-10-08 MED ORDER — FLUOXETINE HCL 20 MG PO TABS
20.0000 mg | ORAL_TABLET | Freq: Every day | ORAL | 1 refills | Status: DC
  Filled 2021-10-08: qty 30, 30d supply, fill #0
  Filled 2021-11-08: qty 30, 30d supply, fill #1

## 2021-10-08 NOTE — Progress Notes (Signed)
..Virtual Visit via Video Note  I connected with Vanessa Winters on 10/08/21 at 11:30 AM EDT by a video enabled telemedicine application and verified that I am speaking with the correct person using two identifiers.  Location: Patient: home Provider: clinic  .Marland KitchenParticipating in visit:  Patient: Vanessa Winters Provider: Tandy Gaw PA-C   I discussed the limitations of evaluation and management by telemedicine and the availability of in person appointments. The patient expressed understanding and agreed to proceed.  History of Present Illness: Pt is a 26 yo female with history of anxiety, depression and OCD who would like to discuss medications.   She went back on wellbutrin a few months back after having a baby and weaning off breastfeeding. She felt like it was really effective until about 1 month ago. She has progressively had more anxiety and depressed mood. She has not wanted to exercise or hang out with people. She is having very stressful dreams. She feels on edge. She is starting to obcess about things like a missing sock that she should not. She was previously on zoloft/lamictal/wellbutrin before pregnancy. No SI/HC.   Her nuva ring has also noted worked well since she tried using it for continuous use. She had it out last week and no period. She has it back in and no period.    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   Encounter for induction of labor 05/13/2020   SVD (spontaneous vaginal delivery) 05/13/2020   Postpartum care following vaginal delivery 3/23 05/13/2020   Perineal laceration, second degree 05/13/2020   Birth control counseling 03/08/2021   Hives 04/06/2021   Intractable migraine without aura and with status migrainosus 04/06/2021   Left ankle pain 05/30/2021   Depressed mood 10/08/2021   Mixed obsessional thoughts and  acts 10/08/2021   Resolved Ambulatory Problems    Diagnosis Date Noted   Hyperlipidemia 05/22/2011   History of migraine headaches 02/18/2013   Irregular menses 02/18/2013   Hidradenitis axillaris 11/07/2014   Discharge from right nipple 05/19/2016   Tobacco abuse 07/25/2018   Obsessive-compulsive behavior 07/25/2018   No energy 07/25/2018   Intractable migraine with aura without status migrainosus 07/25/2018   Trouble in sleeping 03/04/2019   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 03/04/2019   Arthralgia 03/04/2019   Hot flashes 03/04/2019   Nicotine withdrawal 03/13/2019   Fidgeting 04/01/2019   Inattention 04/01/2019   Hypoactive bowel sounds 04/10/2019   History of colitis 04/10/2019   Left lower quadrant pain 04/10/2019   Constipation 04/10/2019   Mood changes 04/30/2019   Family history of bipolar disorder 04/30/2019   Urinary incontinence without sensory awareness 06/03/2019   Brain fog 06/03/2019   Frequent headaches 06/03/2019   Dizziness 06/03/2019   Tachycardia 06/03/2019   Vision changes 06/03/2019   Near syncope 06/03/2019   Other symptoms and signs involving the nervous system 06/03/2019   Mixed obsessional thoughts and acts 06/13/2019   Right knee pain 08/05/2019   Hypotension 08/14/2019   Low serum vitamin B12 08/23/2019   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   Obsessive compulsive disorder 01/08/2020   Injury of left ankle 05/30/2021   Severe sprain of left ankle 05/30/2021   Posterior tibial tendinitis of left lower extremity 07/13/2021   Peroneus longus tendinitis, left 07/13/2021   Posterior tibial tendon tear, traumatic, left, subsequent encounter 07/13/2021   Past Medical  History:  Diagnosis Date   Colon polyps    GERD (gastroesophageal reflux disease)    Migraines    Seasonal allergies        Observations/Objective: No acute distress Flat affect Normal mood and appearance    Assessment and  Plan: Marland KitchenMarland KitchenShanitra was seen today for follow-up, depression and anxiety.  Diagnoses and all orders for this visit:  Mixed obsessional thoughts and acts -     FLUoxetine (PROZAC) 20 MG tablet; Take 1 tablet (20 mg total) by mouth daily.  Anxiety -     FLUoxetine (PROZAC) 20 MG tablet; Take 1 tablet (20 mg total) by mouth daily.  Depressed mood -     FLUoxetine (PROZAC) 20 MG tablet; Take 1 tablet (20 mg total) by mouth daily.  Menorrhagia with irregular cycle -     norethindrone-ethinyl estradiol-FE (JUNEL FE 1/20) 1-20 MG-MCG tablet; Take 1 tablet by mouth daily.   Menstrual cycles have been messed up Stop nuva ring Start junel FE Follow up in 3 months  Discussed mood Continue wellbutrin Added prozac Follow up in 4-6 weeks     Follow Up Instructions:    I discussed the assessment and treatment plan with the patient. The patient was provided an opportunity to ask questions and all were answered. The patient agreed with the plan and demonstrated an understanding of the instructions.   The patient was advised to call back or seek an in-person evaluation if the symptoms worsen or if the condition fails to improve as anticipated.    Tandy Gaw, PA-C

## 2021-10-11 ENCOUNTER — Other Ambulatory Visit (HOSPITAL_BASED_OUTPATIENT_CLINIC_OR_DEPARTMENT_OTHER): Payer: Self-pay

## 2021-10-11 ENCOUNTER — Ambulatory Visit (INDEPENDENT_AMBULATORY_CARE_PROVIDER_SITE_OTHER): Payer: No Typology Code available for payment source | Admitting: Sports Medicine

## 2021-10-11 DIAGNOSIS — G8929 Other chronic pain: Secondary | ICD-10-CM

## 2021-10-11 DIAGNOSIS — M25572 Pain in left ankle and joints of left foot: Secondary | ICD-10-CM

## 2021-10-11 MED ORDER — NITROGLYCERIN 0.2 MG/HR TD PT24
MEDICATED_PATCH | TRANSDERMAL | 11 refills | Status: DC
  Filled 2021-10-11: qty 20, 80d supply, fill #0

## 2021-10-11 NOTE — Progress Notes (Signed)
    Procedures performed today:    None.  Independent interpretation of notes and tests performed by another provider:   None.  Brief History, Exam, Impression, and Recommendations:    Left ankle pain Vanessa Winters returns, she is a pleasant 26 year old female, she has a long history of pain left ankle medial aspect, after failure of conservative treatment an MRI was ultimately obtained that showed multiple pathologic findings, the dominant and most clinically significant findings were posterior tibialis tendinitis and partial tearing. She did have more tenderness at the navicular at the tibialis posterior insertion. We added a boot, she wore this for about 6 weeks total, I added tibialis posterior conditioning and scaphoid pads. She has improved considerably, she is happy with how things are going, adding some additional scaphoid pads to move around between various shoes, continue conditioning and adding topical nitroglycerin, return to see me as needed.  Chronic process not at goal with pharmacologic intervention  ____________________________________________ Ihor Austin. Benjamin Stain, M.D., ABFM., CAQSM., AME. Primary Care and Sports Medicine [place] [facility]  Adjunct Professor of Family Medicine  [place] [employer]  Restaurant manager, fast food

## 2021-10-11 NOTE — Assessment & Plan Note (Signed)
Vanessa Winters returns, she is a pleasant 26 year old female, she has a long history of pain left ankle medial aspect, after failure of conservative treatment an MRI was ultimately obtained that showed multiple pathologic findings, the dominant and most clinically significant findings were posterior tibialis tendinitis and partial tearing. She did have more tenderness at the navicular at the tibialis posterior insertion. We added a boot, she wore this for about 6 weeks total, I added tibialis posterior conditioning and scaphoid pads. She has improved considerably, she is happy with how things are going, adding some additional scaphoid pads to move around between various shoes, continue conditioning and adding topical nitroglycerin, return to see me as needed.

## 2021-10-13 ENCOUNTER — Ambulatory Visit (INDEPENDENT_AMBULATORY_CARE_PROVIDER_SITE_OTHER): Payer: No Typology Code available for payment source | Admitting: Physician Assistant

## 2021-10-13 ENCOUNTER — Encounter: Payer: Self-pay | Admitting: Physician Assistant

## 2021-10-13 VITALS — BP 101/72 | HR 76 | Ht 61.0 in | Wt 153.8 lb

## 2021-10-13 DIAGNOSIS — Z13220 Encounter for screening for lipoid disorders: Secondary | ICD-10-CM | POA: Diagnosis not present

## 2021-10-13 DIAGNOSIS — Z131 Encounter for screening for diabetes mellitus: Secondary | ICD-10-CM

## 2021-10-13 DIAGNOSIS — Z0000 Encounter for general adult medical examination without abnormal findings: Secondary | ICD-10-CM | POA: Diagnosis not present

## 2021-10-13 NOTE — Progress Notes (Signed)
Complete physical exam  Patient: Vanessa Winters   DOB: [birth year]   26 y.o. Female  MRN: [id number]  Subjective:    Chief Complaint  Patient presents with   Annual Exam    Vanessa Winters is a 26 y.o. female who presents today for a complete physical exam. She reports consuming a general diet.  Pt walks regular and runs from time to time.   She generally feels well. She reports sleeping well. She does not have additional problems to discuss today.    Most recent fall risk assessment:    05/26/2021    1:27 PM  [place] in the past year? 0  Number falls in past yr: 0  Injury with Fall? 0  Risk for fall due to : No Fall Risks  Follow up Falls prevention discussed;Falls evaluation completed     Most recent depression screenings:    10/08/2021    9:42 AM 10/08/2021    9:40 AM  PHQ 2/9 Scores  PHQ - 2 Score 2 6  PHQ- 9 Score 11 25    Vision:Within last year and Dental: No current dental problems  Patient Active Problem List   Diagnosis Date Noted   Depressed mood 10/08/2021   Mixed obsessional thoughts and acts 10/08/2021   Left ankle pain 05/30/2021   Hives 04/06/2021   Intractable migraine without aura and with status migrainosus 04/06/2021   Birth control counseling 03/08/2021   Encounter for induction of labor 05/13/2020   SVD (spontaneous vaginal delivery) 05/13/2020   Postpartum care following vaginal delivery 3/23 05/13/2020   Perineal laceration, second degree 05/13/2020   Attention deficit hyperactivity disorder (ADHD), combined type 01/08/2020   Bipolar 2 disorder ([place]) 06/13/2019   Anxiety 07/25/2018   Past Medical History:  Diagnosis Date   Colon polyps    Discharge from right nipple 07/2016   GERD (gastroesophageal reflux disease)    Hyperlipidemia    Migraines    Seasonal allergies    Family History  Problem Relation Age of Onset   Hypertension Mother    Breast cancer Mother    Hyperlipidemia Father    Breast cancer Maternal Grandmother     Leukemia Paternal Grandmother    Parkinson's disease Paternal Grandfather    Skin cancer Other        uncle   Heart attack Other        great uncle   Stroke Other        great uncle   Allergies  Allergen Reactions   Adhesive [Tape] Other (See Comments)    STERI-STRIPS:  BLISTERS   Buspar [Buspirone] Other (See Comments)    Constipation.       Patient Care Team: Lavada Mesi as PCP - General (Family Medicine) Brien Few, MD as Consulting Physician (Obstetrics and Gynecology)   Outpatient Medications Prior to Visit  Medication Sig   buPROPion (WELLBUTRIN XL) 150 MG 24 hr tablet Take 1 tablet (150 mg total) by mouth in the morning.   FLUoxetine (PROZAC) 20 MG tablet Take 1 tablet (20 mg total) by mouth daily.   nitroGLYCERIN (NITRODUR - DOSED IN MG/24 HR) 0.2 mg/hr patch Cut and apply 1/4 patch to most painful area every 24 hours.   norethindrone-ethinyl estradiol-FE (JUNEL FE 1/20) 1-20 MG-MCG tablet Take 1 tablet by mouth daily.   rizatriptan (MAXALT) 10 MG tablet Take 1 tablet (10 mg total) by mouth as needed for migraine. May repeat in 2 hours if needed  No facility-administered medications prior to visit.    ROS        Objective:     BP 101/72   Pulse 76   Ht '5\' 1"'  (1.549 m)   Wt 153 lb 12.8 oz (69.8 kg)   SpO2 99%   BMI 29.06 kg/m  BP Readings from Last 3 Encounters:  10/13/21 101/72  05/26/21 130/74  04/06/21 105/62   Wt Readings from Last 3 Encounters:  10/13/21 153 lb 12.8 oz (69.8 kg)  05/26/21 140 lb (63.5 kg)  03/05/21 143 lb (64.9 kg)      Physical Exam   BP 101/72   Pulse 76   Ht '5\' 1"'  (1.549 m)   Wt 153 lb 12.8 oz (69.8 kg)   SpO2 99%   BMI 29.06 kg/m   General Appearance:    Alert, cooperative, no distress, appears stated age  Head:    Normocephalic, without obvious abnormality, atraumatic  Eyes:    PERRL, conjunctiva/corneas clear, EOM's intact, fundi    benign, both eyes  Ears:    Normal TM's and external  ear canals, both ears  Nose:   Nares normal, septum midline, mucosa normal, no drainage    or sinus tenderness  Throat:   Lips, mucosa, and tongue normal; teeth and gums normal  Neck:   Supple, symmetrical, trachea midline, no adenopathy;    thyroid:  no enlargement/tenderness/nodules; no carotid   bruit or JVD  Back:     Symmetric, no curvature, ROM normal, no CVA tenderness  Lungs:     Clear to auscultation bilaterally, respirations unlabored  Chest Wall:    No tenderness or deformity   Heart:    Regular rate and rhythm, S1 and S2 normal, no murmur, rub   or gallop     Abdomen:     Soft, non-tender, bowel sounds active all four quadrants,    no masses, no organomegaly        Extremities:   Extremities normal, atraumatic, no cyanosis or edema  Pulses:   2+ and symmetric all extremities  Skin:   Skin color, texture, turgor normal, no rashes or lesions  Lymph nodes:   Cervical, supraclavicular, and axillary nodes normal  Neurologic:   CNII-XII intact, normal strength, sensation and reflexes    throughout   Assessment & Plan:    Routine Health Maintenance and Physical Exam  Immunization History  Administered Date(s) Administered   DTaP 03/15/1994, 05/02/1994, 07/29/1994, 03/07/1995, 07/22/1999   Hepatitis B [birth year], 03/15/1994, 07/29/1994   Hepatitis B, adult 06/13/2014, 07/11/2014, 12/05/2014   HiB (PRP-OMP) 03/15/1994, 05/02/1994, 07/29/1994, 03/07/1995   IPV 03/15/1994, 05/02/1994, 07/29/1994, 07/22/1999   Influenza,inj,Quad PF,6+ Mos 12/12/2019, 12/18/2020   Influenza-Unspecified 11/05/2016, 11/21/2017, 11/06/2018   MMR 03/07/1995, 07/22/1999   Moderna Covid-19 Vaccine Bivalent Booster 73yr & up 01/06/2021, 01/06/2021   PFIZER(Purple Top)SARS-COV-2 Vaccination 04/27/2019, 06/01/2019   Tdap 02/22/2011, 04/21/2011, 04/21/2011, 02/23/2020    Health Maintenance  Topic Date Due   COVID-19 Vaccine (4 - Mixed Product risk series) 03/03/2021   INFLUENZA VACCINE  09/21/2021    PAP-Cervical Cytology Screening  08/21/2022   PAP SMEAR-Modifier  08/21/2022   TETANUS/TDAP  02/22/2030   Hepatitis C Screening  Completed   HIV Screening  Completed   HPV VACCINES  Aged Out    Discussed health benefits of physical activity, and encouraged her to engage in regular exercise appropriate for her age and condition.  .Marland KitchenApolonio Schneiderswas seen today for annual exam.  Diagnoses and all orders for this visit:  Routine physical examination -     TSH -     Lipid Panel w/reflex Direct LDL -     COMPLETE METABOLIC PANEL WITH GFR -     CBC with Differential/Platelet  Screening for lipid disorders -     Lipid Panel w/reflex Direct LDL  Screening for diabetes mellitus -     COMPLETE METABOLIC PANEL WITH GFR   .Marland Kitchen Discussed 150 minutes of exercise a week.  Encouraged vitamin D 1000 units and Calcium 1314m or 4 servings of dairy a day.  PHQ no concerns-pt already feeling better on prozac Fasting labs ordered Pap UTD Will get flu shot at work Return in about 1 year (around 10/14/2022), or if symptoms worsen or fail to improve.     JIran Planas PA-C

## 2021-10-13 NOTE — Patient Instructions (Signed)

## 2021-10-14 LAB — CBC WITH DIFFERENTIAL/PLATELET
Absolute Monocytes: 340 cells/uL (ref 200–950)
Basophils Absolute: 0 cells/uL (ref 0–200)
Basophils Relative: 0 %
Eosinophils Absolute: 34 cells/uL (ref 15–500)
Eosinophils Relative: 0.4 %
HCT: 37.5 % (ref 35.0–45.0)
Hemoglobin: 12.5 g/dL (ref 11.7–15.5)
Lymphs Abs: 3094 cells/uL (ref 850–3900)
MCH: 29.8 pg (ref 27.0–33.0)
MCHC: 33.3 g/dL (ref 32.0–36.0)
MCV: 89.3 fL (ref 80.0–100.0)
MPV: 9.7 fL (ref 7.5–12.5)
Monocytes Relative: 4 %
Neutro Abs: 5032 cells/uL (ref 1500–7800)
Neutrophils Relative %: 59.2 %
Platelets: 266 10*3/uL (ref 140–400)
RBC: 4.2 10*6/uL (ref 3.80–5.10)
RDW: 13.6 % (ref 11.0–15.0)
Total Lymphocyte: 36.4 %
WBC: 8.5 10*3/uL (ref 3.8–10.8)

## 2021-10-14 LAB — COMPLETE METABOLIC PANEL WITH GFR
AG Ratio: 1.6 (calc) (ref 1.0–2.5)
ALT: 10 U/L (ref 6–29)
AST: 16 U/L (ref 10–30)
Albumin: 4.1 g/dL (ref 3.6–5.1)
Alkaline phosphatase (APISO): 51 U/L (ref 31–125)
BUN: 8 mg/dL (ref 7–25)
CO2: 23 mmol/L (ref 20–32)
Calcium: 8.9 mg/dL (ref 8.6–10.2)
Chloride: 106 mmol/L (ref 98–110)
Creat: 0.87 mg/dL (ref 0.50–0.96)
Globulin: 2.5 g/dL (calc) (ref 1.9–3.7)
Glucose, Bld: 81 mg/dL (ref 65–99)
Potassium: 3.9 mmol/L (ref 3.5–5.3)
Sodium: 139 mmol/L (ref 135–146)
Total Bilirubin: 0.4 mg/dL (ref 0.2–1.2)
Total Protein: 6.6 g/dL (ref 6.1–8.1)
eGFR: 94 mL/min/{1.73_m2} (ref >=60)

## 2021-10-14 LAB — LIPID PANEL W/REFLEX DIRECT LDL
Cholesterol: 269 mg/dL — ABNORMAL HIGH (ref <200)
HDL: 54 mg/dL (ref >=50)
LDL Cholesterol (Calc): 194 mg/dL (calc) — ABNORMAL HIGH
Non-HDL Cholesterol (Calc): 215 mg/dL (calc) — ABNORMAL HIGH (ref <130)
Total CHOL/HDL Ratio: 5 (calc) — ABNORMAL HIGH (ref <5.0)
Triglycerides: 92 mg/dL (ref <150)

## 2021-10-14 LAB — TSH: TSH: 0.86 mIU/L

## 2021-10-14 NOTE — Progress Notes (Signed)
Margarite,   Thyroid looks great.  Kidney, liver, glucose looks good.  Hemoglobin looks great.  LDL is up.  HDL looks good.  TG looks good.  We could restart statin but would need to stop it before getting pregnant.

## 2021-11-08 ENCOUNTER — Other Ambulatory Visit (HOSPITAL_BASED_OUTPATIENT_CLINIC_OR_DEPARTMENT_OTHER): Payer: Self-pay

## 2021-11-18 DIAGNOSIS — J0140 Acute pansinusitis, unspecified: Secondary | ICD-10-CM

## 2021-12-07 ENCOUNTER — Other Ambulatory Visit (INDEPENDENT_AMBULATORY_CARE_PROVIDER_SITE_OTHER): Payer: No Typology Code available for payment source | Admitting: Neurology

## 2021-12-07 DIAGNOSIS — Z23 Encounter for immunization: Secondary | ICD-10-CM | POA: Diagnosis not present

## 2021-12-13 ENCOUNTER — Other Ambulatory Visit: Payer: Self-pay | Admitting: Physician Assistant

## 2021-12-13 ENCOUNTER — Other Ambulatory Visit (HOSPITAL_BASED_OUTPATIENT_CLINIC_OR_DEPARTMENT_OTHER): Payer: Self-pay

## 2021-12-13 DIAGNOSIS — R4589 Other symptoms and signs involving emotional state: Secondary | ICD-10-CM

## 2021-12-13 DIAGNOSIS — F422 Mixed obsessional thoughts and acts: Secondary | ICD-10-CM

## 2021-12-13 DIAGNOSIS — F419 Anxiety disorder, unspecified: Secondary | ICD-10-CM

## 2021-12-13 DIAGNOSIS — N921 Excessive and frequent menstruation with irregular cycle: Secondary | ICD-10-CM

## 2021-12-13 MED ORDER — FLUOXETINE HCL 20 MG PO CAPS
20.0000 mg | ORAL_CAPSULE | Freq: Every day | ORAL | 1 refills | Status: DC
  Filled 2021-12-13 (×2): qty 90, 90d supply, fill #0
  Filled 2022-03-16: qty 90, 90d supply, fill #1

## 2021-12-13 MED ORDER — NORETHIN ACE-ETH ESTRAD-FE 1-20 MG-MCG PO TABS
1.0000 | ORAL_TABLET | Freq: Every day | ORAL | 1 refills | Status: DC
  Filled 2021-12-13 – 2021-12-27 (×2): qty 84, 84d supply, fill #0
  Filled 2022-03-16: qty 84, 84d supply, fill #1

## 2021-12-17 MED ORDER — AMOXICILLIN-POT CLAVULANATE 875-125 MG PO TABS: 1.0000 | ORAL_TABLET | Freq: Two times a day (BID) | ORAL | 0 refills | Status: DC

## 2021-12-17 NOTE — Telephone Encounter (Signed)
Here with daughter for follow up on bronchitis. She has been coughing and congested this whole time 3-4 weeks. Lots of sinus pressure. Using OTC mucinex and delsym. Husband sick too.   Lungs: NSR no wheezing or rhonchi  Sent augmentin for sinusitis.

## 2021-12-27 ENCOUNTER — Other Ambulatory Visit (HOSPITAL_BASED_OUTPATIENT_CLINIC_OR_DEPARTMENT_OTHER): Payer: Self-pay

## 2022-01-04 ENCOUNTER — Other Ambulatory Visit (HOSPITAL_BASED_OUTPATIENT_CLINIC_OR_DEPARTMENT_OTHER): Payer: Self-pay

## 2022-03-16 ENCOUNTER — Other Ambulatory Visit: Payer: Self-pay

## 2022-03-16 ENCOUNTER — Other Ambulatory Visit: Payer: Self-pay | Admitting: Physician Assistant

## 2022-03-16 ENCOUNTER — Other Ambulatory Visit (HOSPITAL_BASED_OUTPATIENT_CLINIC_OR_DEPARTMENT_OTHER): Payer: Self-pay

## 2022-03-16 DIAGNOSIS — F419 Anxiety disorder, unspecified: Secondary | ICD-10-CM

## 2022-03-16 DIAGNOSIS — G43011 Migraine without aura, intractable, with status migrainosus: Secondary | ICD-10-CM

## 2022-03-16 DIAGNOSIS — F902 Attention-deficit hyperactivity disorder, combined type: Secondary | ICD-10-CM

## 2022-03-16 DIAGNOSIS — Z3009 Encounter for other general counseling and advice on contraception: Secondary | ICD-10-CM

## 2022-03-16 DIAGNOSIS — F3181 Bipolar II disorder: Secondary | ICD-10-CM

## 2022-03-16 MED ORDER — RIZATRIPTAN BENZOATE 10 MG PO TABS
10.0000 mg | ORAL_TABLET | ORAL | 3 refills | Status: DC | PRN
  Filled 2022-03-16: qty 10, 17d supply, fill #0
  Filled 2022-05-17 (×2): qty 10, 17d supply, fill #1
  Filled 2022-06-30 – 2022-07-08 (×3): qty 10, 17d supply, fill #2
  Filled 2022-08-17: qty 10, 17d supply, fill #3

## 2022-03-16 MED ORDER — BUPROPION HCL ER (XL) 150 MG PO TB24
150.0000 mg | ORAL_TABLET | Freq: Every morning | ORAL | 1 refills | Status: DC
  Filled 2022-03-16: qty 90, 90d supply, fill #0
  Filled 2022-03-16: qty 2, 2d supply, fill #0
  Filled 2022-06-30 – 2022-07-08 (×3): qty 90, 90d supply, fill #1

## 2022-04-06 ENCOUNTER — Encounter: Payer: Self-pay | Admitting: Physician Assistant

## 2022-04-06 ENCOUNTER — Ambulatory Visit (INDEPENDENT_AMBULATORY_CARE_PROVIDER_SITE_OTHER): Payer: 59 | Admitting: Physician Assistant

## 2022-04-06 VITALS — BP 122/72 | HR 82 | Ht 61.0 in | Wt 166.0 lb

## 2022-04-06 DIAGNOSIS — R232 Flushing: Secondary | ICD-10-CM | POA: Diagnosis not present

## 2022-04-06 DIAGNOSIS — E66811 Obesity, class 1: Secondary | ICD-10-CM | POA: Insufficient documentation

## 2022-04-06 DIAGNOSIS — Z6831 Body mass index (BMI) 31.0-31.9, adult: Secondary | ICD-10-CM

## 2022-04-06 DIAGNOSIS — E6609 Other obesity due to excess calories: Secondary | ICD-10-CM | POA: Diagnosis not present

## 2022-04-06 DIAGNOSIS — R635 Abnormal weight gain: Secondary | ICD-10-CM | POA: Diagnosis not present

## 2022-04-06 NOTE — Progress Notes (Signed)
Established Patient Office Visit  Subjective   Patient ID: Vanessa Winters, female    DOB: 26-May-1995  Age: 27 y.o. MRN: [id number]  Chief Complaint  Patient presents with   Follow-up    HPI Pt is a 27 yo female who presents to the clinic to discuss weight and hot flashes. She is frustrated because she is doing cardio at the gym 3 times a week for 1 hour and eating healthy with no weight loss. She has even stopped all alcohol. She is on OCP but still having irregular periods. She has frequent hot flashes and night sweats.     .. Active Ambulatory Problems    Diagnosis Date Noted   Anxiety 07/25/2018   Bipolar 2 disorder ([facility]) 06/13/2019   Attention deficit hyperactivity disorder (ADHD), combined type 01/08/2020   Encounter for induction of labor 05/13/2020   SVD (spontaneous vaginal delivery) 05/13/2020   Postpartum care following vaginal delivery 3/23 05/13/2020   Perineal laceration, second degree 05/13/2020   Birth control counseling 03/08/2021   Hives 04/06/2021   Intractable migraine without aura and with status migrainosus 04/06/2021   Left ankle pain 05/30/2021   Depressed mood 10/08/2021   Mixed obsessional thoughts and acts 10/08/2021   Class 1 obesity due to excess calories without serious comorbidity with body mass index (BMI) of 31.0 to 31.9 in adult 04/06/2022   Abnormal weight gain 04/06/2022   Hot flashes 04/06/2022   Resolved Ambulatory Problems    Diagnosis Date Noted   Hyperlipidemia 05/22/2011   History of migraine headaches 02/18/2013   Irregular menses 02/18/2013   Hidradenitis axillaris 11/07/2014   Discharge from right nipple 05/19/2016   Tobacco abuse 07/25/2018   Obsessive-compulsive behavior 07/25/2018   No energy 07/25/2018   Intractable migraine with aura without status migrainosus 07/25/2018   Trouble in sleeping 03/04/2019   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 03/04/2019   Arthralgia 03/04/2019    Hot flashes 03/04/2019   Nicotine withdrawal 03/13/2019   Fidgeting 04/01/2019   Inattention 04/01/2019   Hypoactive bowel sounds 04/10/2019   History of colitis 04/10/2019   Left lower quadrant pain 04/10/2019   Constipation 04/10/2019   Mood changes 04/30/2019   Family history of bipolar disorder 04/30/2019   Urinary incontinence without sensory awareness 06/03/2019   Brain fog 06/03/2019   Frequent headaches 06/03/2019   Dizziness 06/03/2019   Tachycardia 06/03/2019   Vision changes 06/03/2019   Near syncope 06/03/2019   Other symptoms and signs involving the nervous system 06/03/2019   Mixed obsessional thoughts and acts 06/13/2019   Right knee pain 08/05/2019   Hypotension 08/14/2019   Low serum vitamin B12 08/23/2019   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   Obsessive compulsive disorder 01/08/2020   Injury of left ankle 05/30/2021   Severe sprain of left ankle 05/30/2021   Posterior tibial tendinitis of left lower extremity 07/13/2021   Peroneus longus tendinitis, left 07/13/2021   Posterior tibial tendon tear, traumatic, left, subsequent encounter 07/13/2021   Past Medical History:  Diagnosis Date   Colon polyps    GERD (gastroesophageal reflux disease)    Migraines    Seasonal allergies      ROS See HPI.    Objective:     BP 122/72   Pulse 82   Ht 5' 1"$  (1.549 m)   Wt 166 lb (75.3 kg)   SpO2 100%   BMI 31.37 kg/m  BP Readings from Last 3 Encounters:  04/06/22 122/72  10/13/21 101/72  05/26/21 130/74   Wt Readings from Last 3 Encounters:  04/06/22 166 lb (75.3 kg)  10/13/21 153 lb 12.8 oz (69.8 kg)  05/26/21 140 lb (63.5 kg)      Physical Exam Constitutional:      Appearance: Normal appearance.  HENT:     Head: Normocephalic.  Cardiovascular:     Rate and Rhythm: Normal rate and regular rhythm.     Pulses: Normal pulses.  Pulmonary:     Effort: Pulmonary effort is normal.     Breath sounds: Normal breath sounds.  Neurological:      General: No focal deficit present.     Mental Status: She is alert and oriented to person, place, and time.  Psychiatric:        Mood and Affect: Mood normal.         Assessment & Plan:  Marland KitchenMarland KitchenEllisen was seen today for follow-up.  Diagnoses and all orders for this visit:  Hot flashes -     CP Testosterone, BIO-Female/Children -     Cancel: Estradiol -     Cancel: Progesterone -     Cancel: FSH/LH -     COMPLETE METABOLIC PANEL WITH GFR -     Cancel: TSH -     Estradiol -     FSH/LH -     Progesterone -     TSH -     CMP14+EGFR -     Testosterone  Abnormal weight gain -     CP Testosterone, BIO-Female/Children -     Cancel: Estradiol -     Cancel: Progesterone -     Cancel: FSH/LH -     COMPLETE METABOLIC PANEL WITH GFR -     Cancel: TSH -     Estradiol -     FSH/LH -     Progesterone -     TSH -     CMP14+EGFR -     Testosterone  Class 1 obesity due to excess calories without serious comorbidity with body mass index (BMI) of 31.0 to 31.9 in adult -     CP Testosterone, BIO-Female/Children -     COMPLETE METABOLIC PANEL WITH GFR -     Estradiol -     FSH/LH -     Progesterone -     TSH -     CMP14+EGFR -     Testosterone   Unclear etiology Will check hormones and other labs Discussed insulin resistance HO on IF 16 to 8.  Consider metformin to aid with weight loss Make sure getting 150 minutes a week Increase protein Follow up as needed   Iran Planas, PA-C

## 2022-04-06 NOTE — Patient Instructions (Signed)
Insulin Resistance  Insulin is a hormone that is made by the pancreas. Insulin allows blood sugar (glucose) to enter the cells in the body. Insulin helps the body use glucose for energy. Normally, the body is insulin sensitive, which means the cells in the body are effective at absorbing glucose. Insulin resistance is when the cells in the body do not respond properly to insulin and are not able to absorb glucose. The pancreas makes more insulin, but over time the body cannot make enough insulin to keep glucose at normal levels. Insulin resistance results in high blood glucose levels (hyperglycemia) and can lead to problems, including: Prediabetes. Type 2 diabetes (diabetes mellitus). Heart disease. High blood pressure (hypertension). Stroke. Polycystic ovary syndrome (PCOS). Nonalcoholic fatty liver disease. What are the causes? The exact cause of insulin resistance is not known. What increases the risk? The following factors may make you more likely to develop insulin resistance: Being overweight or obese, especially if a lot of your weight is in your waist area. Having an inactive (sedentary) lifestyle. Having above-normal glucose levels. Having abnormal cholesterol levels. Having sleep apnea. Being older than age 39. Using steroids. What are the signs or symptoms? This condition usually does not cause symptoms. A waist measurement of more than 35 inches (88.9 cm) for women and more than 40 inches (101.6 cm) for men may be a sign of insulin resistance. How is this diagnosed? There is no test to diagnose insulin resistance. However, your health care provider may diagnose insulin resistance based on: A physical exam. Your medical history. Blood tests that check your blood glucose level. How is this treated? Insulin resistance is treated with nutrition and lifestyle changes. These changes may include: Eating a healthy balance of nutritious foods. Getting more physical  activity. Maintaining a healthy weight. Stopping the use of any tobacco products. Your health care provider will work with you to change your nutrition and lifestyle as needed. In some cases, treatment may also include medicine to improve your insulin sensitivity. Follow these instructions at home: Activity Be physically active. Do moderate-intensity physical activity for at least 30 minutes on 5 or more days of the week, or as told by your health care provider. This could include brisk walking, biking, or water aerobics. Ask your health care provider what activities are safe for you. A mix of physical activities may be best, such as walking, swimming, biking, and strength training. Eating and drinking  Follow a healthy meal plan. This includes eating: Lean proteins. Complex carbohydrates. Examples of these include whole grains, starchy vegetables (potatoes, corn, peas), and beans. Fresh fruits and vegetables. Low-fat dairy products. Healthy fats. Follow instructions from your health care provider about eating or drinking restrictions. Make an appointment to see a diet and nutrition specialist (registered dietitian) to help you create a healthy eating plan. General instructions Check your blood glucose levels as told by your health care provider. Take over-the-counter and prescription medicines only as told by your health care provider. Lose weight as told by your health care provider. Losing 5-7% of your body weight can reverse insulin resistance. Your health care provider can determine how much weight loss is best for you and can help you lose weight safely. Do not use any products that contain nicotine or tobacco. These products include cigarettes, chewing tobacco, and vaping devices, such as e-cigarettes. If you need help quitting, ask your health care provider. Keep all follow-up visits. This is important. Contact a health care provider if: You have trouble losing  weight or  maintaining your goal weight. You gain weight. You have trouble following your prescribed meal plan. You have trouble exercising more. Summary Insulin resistance occurs when cells in the body do not respond properly to insulin and are not able to absorb blood sugar (glucose). The body makes more insulin, but over time the body cannot make enough insulin to keep blood sugar at normal levels. Insulin resistance is treated with nutrition and lifestyle changes, including eating a healthy balance of nutritious foods, getting more physical activity, and maintaining a healthy weight. Your health care provider will work with you to change your nutrition and lifestyle as needed. Treatment may also include medicine to improve your insulin sensitivity. Check your blood glucose levels as told by your health care provider. Keep all follow-up visits. This is important. This information is not intended to replace advice given to you by your health care provider. Make sure you discuss any questions you have with your health care provider. Document Revised: 10/28/2019 Document Reviewed: 10/28/2019 Elsevier Patient Education  [employer].

## 2022-04-07 LAB — TESTOSTERONE: Testosterone: 45 ng/dL (ref 13–71)

## 2022-04-07 LAB — TSH: TSH: 1.31 u[IU]/mL (ref 0.450–4.500)

## 2022-04-07 LAB — CMP14+EGFR
ALT: 21 IU/L (ref 0–32)
AST: 18 IU/L (ref 0–40)
Albumin/Globulin Ratio: 1.8 (ref 1.2–2.2)
Albumin: 4.3 g/dL (ref 4.0–5.0)
Alkaline Phosphatase: 88 IU/L (ref 44–121)
BUN/Creatinine Ratio: 16 (ref 9–23)
BUN: 13 mg/dL (ref 6–20)
Bilirubin Total: <0.2 mg/dL (ref 0.0–1.2)
CO2: 19 mmol/L — ABNORMAL LOW (ref 20–29)
Calcium: 8.8 mg/dL (ref 8.7–10.2)
Chloride: 104 mmol/L (ref 96–106)
Creatinine, Ser: 0.83 mg/dL (ref 0.57–1.00)
Globulin, Total: 2.4 g/dL (ref 1.5–4.5)
Glucose: 91 mg/dL (ref 70–99)
Potassium: 4.5 mmol/L (ref 3.5–5.2)
Sodium: 141 mmol/L (ref 134–144)
Total Protein: 6.7 g/dL (ref 6.0–8.5)
eGFR: 98 mL/min/{1.73_m2} (ref >59)

## 2022-04-07 LAB — FSH/LH
FSH: 1.8 m[IU]/mL
LH: 1.7 m[IU]/mL

## 2022-04-07 LAB — ESTRADIOL: Estradiol: 14 pg/mL

## 2022-04-07 LAB — PROGESTERONE: Progesterone: 0.2 ng/mL

## 2022-04-08 ENCOUNTER — Encounter: Payer: Self-pay | Admitting: Physician Assistant

## 2022-04-08 ENCOUNTER — Other Ambulatory Visit (HOSPITAL_BASED_OUTPATIENT_CLINIC_OR_DEPARTMENT_OTHER): Payer: Self-pay

## 2022-04-08 MED ORDER — NORETHINDRONE ACET-ETHINYL EST 1.5-30 MG-MCG PO TABS
1.0000 | ORAL_TABLET | Freq: Every day | ORAL | 0 refills | Status: DC
  Filled 2022-04-08: qty 84, 84d supply, fill #0

## 2022-04-08 NOTE — Progress Notes (Signed)
Remind me when your last period was?  Testosterone normal.  Thyroid looks great.  Progesterone is low. We could supplement this.  Estrogen is low too but no signs of early menopause etc.  Glucose, kidney, liver look good.   My suggestion would be to change up your birth control. Thoughts?

## 2022-04-14 DIAGNOSIS — N939 Abnormal uterine and vaginal bleeding, unspecified: Secondary | ICD-10-CM | POA: Diagnosis not present

## 2022-05-17 ENCOUNTER — Other Ambulatory Visit (HOSPITAL_BASED_OUTPATIENT_CLINIC_OR_DEPARTMENT_OTHER): Payer: Self-pay

## 2022-06-29 ENCOUNTER — Other Ambulatory Visit: Payer: Self-pay | Admitting: Physician Assistant

## 2022-06-29 MED ORDER — CLONAZEPAM 0.5 MG PO TABS: 0.5000 mg | ORAL_TABLET | Freq: Two times a day (BID) | ORAL | 0 refills | Status: DC | PRN

## 2022-06-29 NOTE — Progress Notes (Signed)
Pts daughter has a major surgery coming up and needs something to help with anxiety. Klonapin sent.

## 2022-06-30 ENCOUNTER — Other Ambulatory Visit: Payer: Self-pay

## 2022-06-30 ENCOUNTER — Other Ambulatory Visit: Payer: Self-pay | Admitting: Physician Assistant

## 2022-06-30 ENCOUNTER — Other Ambulatory Visit (HOSPITAL_COMMUNITY): Payer: Self-pay

## 2022-06-30 MED ORDER — NORETHINDRONE ACET-ETHINYL EST 1.5-30 MG-MCG PO TABS
1.0000 | ORAL_TABLET | Freq: Every day | ORAL | 0 refills | Status: DC
  Filled 2022-06-30 – 2022-07-05 (×2): qty 84, 84d supply, fill #0

## 2022-07-05 ENCOUNTER — Other Ambulatory Visit (HOSPITAL_BASED_OUTPATIENT_CLINIC_OR_DEPARTMENT_OTHER): Payer: Self-pay

## 2022-07-05 ENCOUNTER — Other Ambulatory Visit (HOSPITAL_COMMUNITY): Payer: Self-pay

## 2022-07-06 ENCOUNTER — Other Ambulatory Visit (HOSPITAL_BASED_OUTPATIENT_CLINIC_OR_DEPARTMENT_OTHER): Payer: Self-pay

## 2022-07-08 ENCOUNTER — Other Ambulatory Visit (HOSPITAL_BASED_OUTPATIENT_CLINIC_OR_DEPARTMENT_OTHER): Payer: Self-pay

## 2022-07-09 ENCOUNTER — Other Ambulatory Visit (HOSPITAL_COMMUNITY): Payer: Self-pay

## 2022-07-13 ENCOUNTER — Other Ambulatory Visit (HOSPITAL_COMMUNITY): Payer: Self-pay

## 2022-07-20 ENCOUNTER — Other Ambulatory Visit (HOSPITAL_COMMUNITY): Payer: Self-pay

## 2022-07-27 ENCOUNTER — Telehealth (INDEPENDENT_AMBULATORY_CARE_PROVIDER_SITE_OTHER): Payer: 59 | Admitting: Physician Assistant

## 2022-07-27 ENCOUNTER — Encounter: Payer: Self-pay | Admitting: Physician Assistant

## 2022-07-27 ENCOUNTER — Other Ambulatory Visit (HOSPITAL_BASED_OUTPATIENT_CLINIC_OR_DEPARTMENT_OTHER): Payer: Self-pay

## 2022-07-27 VITALS — Wt 176.0 lb

## 2022-07-27 DIAGNOSIS — E6609 Other obesity due to excess calories: Secondary | ICD-10-CM

## 2022-07-27 DIAGNOSIS — R635 Abnormal weight gain: Secondary | ICD-10-CM | POA: Diagnosis not present

## 2022-07-27 MED ORDER — PHENTERMINE-TOPIRAMATE ER 15-92 MG PO CP24
1.0000 | ORAL_CAPSULE | Freq: Every morning | ORAL | 0 refills | Status: DC
  Filled 2022-07-27: qty 90, 90d supply, fill #0

## 2022-07-27 MED ORDER — PHENTERMINE-TOPIRAMATE ER 11.25-69 MG PO CP24
1.0000 | ORAL_CAPSULE | Freq: Every morning | ORAL | 0 refills | Status: DC
  Filled 2022-07-27 (×2): qty 30, 30d supply, fill #0
  Filled 2022-08-03: qty 30, 6d supply, fill #0
  Filled ????-??-??: fill #0

## 2022-07-27 NOTE — Progress Notes (Signed)
..Virtual Visit via Telephone Note  I connected with Vanessa Winters on 07/27/22 at  3:20 PM EDT by telephone and verified that I am speaking with the correct person using two identifiers.  Location: Patient:  Provider:   Marland KitchenMarland KitchenParticipating in visit:  Patient:  Provider:    I discussed the limitations, risks, security and privacy concerns of performing an evaluation and management service by telephone and the availability of in person appointments. I also discussed with the patient that there may be a patient responsible charge related to this service. The patient expressed understanding and agreed to proceed.   History of Present Illness: Patient is a 27 year old obese female who calls into the clinic to discuss weight.  She continues to gain weight over the last year.  She has gained 10 pounds since February 2024.  She is exercising for at least 30 minutes every other day with cardio and circuit training at the gym.  She does not eat out.  She cooks home-cooked meals every night.  She is frustrated with her weight.  She would like to get pregnant again but does not want to get pregnant at her heaviest weight.  . 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   Encounter for induction of labor 05/13/2020   SVD (spontaneous vaginal delivery) 05/13/2020   Postpartum care following vaginal delivery 3/23 05/13/2020   Perineal laceration, second degree 05/13/2020   Birth control counseling 03/08/2021   Hives 04/06/2021   Intractable migraine without aura and with status migrainosus 04/06/2021   Left ankle pain 05/30/2021   Depressed mood 10/08/2021   Mixed obsessional thoughts and acts 10/08/2021   Class 1 obesity due to excess calories without serious comorbidity with body mass index (BMI) of 33.0 to 33.9 in adult 04/06/2022   Abnormal weight gain  04/06/2022   Hot flashes 04/06/2022   Resolved Ambulatory Problems    Diagnosis Date Noted   Hyperlipidemia 05/22/2011   History of migraine headaches 02/18/2013   Irregular menses 02/18/2013   Hidradenitis axillaris 11/07/2014   Discharge from right nipple 05/19/2016   Tobacco abuse 07/25/2018   Obsessive-compulsive behavior 07/25/2018   No energy 07/25/2018   Intractable migraine with aura without status migrainosus 07/25/2018   Trouble in sleeping 03/04/2019   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 03/04/2019   Arthralgia 03/04/2019   Hot flashes 03/04/2019   Nicotine withdrawal 03/13/2019   Fidgeting 04/01/2019   Inattention 04/01/2019   Hypoactive bowel sounds 04/10/2019   History of colitis 04/10/2019   Left lower quadrant pain 04/10/2019   Constipation 04/10/2019   Mood changes 04/30/2019   Family history of bipolar disorder 04/30/2019   Urinary incontinence without sensory awareness 06/03/2019   Brain fog 06/03/2019   Frequent headaches 06/03/2019   Dizziness 06/03/2019   Tachycardia 06/03/2019   Vision changes 06/03/2019   Near syncope 06/03/2019   Other symptoms and signs involving the nervous system 06/03/2019   Mixed obsessional thoughts and acts 06/13/2019   Right knee pain 08/05/2019   Hypotension 08/14/2019   Low serum vitamin B12 08/23/2019   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   Obsessive compulsive disorder 01/08/2020   Injury of left ankle 05/30/2021   Severe sprain of left ankle 05/30/2021   Posterior tibial tendinitis of left lower extremity 07/13/2021   Peroneus longus tendinitis, left 07/13/2021   Posterior tibial tendon tear,  traumatic, left, subsequent encounter 07/13/2021   Past Medical History:  Diagnosis Date   Colon polyps    GERD (gastroesophageal reflux disease)    Migraines    Seasonal allergies        Observations/Objective: No acute distress Normal mood   .Marland Kitchen Today's Vitals    07/27/22 1240  Weight: 176 lb (79.8 kg)   Body mass index is 33.25 kg/m.    Assessment and Plan: Marland KitchenMarland KitchenDiagnoses and all orders for this visit:  Abnormal weight gain  Class 1 obesity due to excess calories without serious comorbidity with body mass index (BMI) of 33.0 to 33.9 in adult   .Marland KitchenDiscussed low carb diet with 1500 calories and 80g of protein.  Exercising at least 150 minutes a week.  [employer] could be a Chief Technology Officer.  Discussed weight loss medication options: Contrave/Wegovy/Zepbound/Qsymia Opted for qsymia, sent to pharmacy.  SE discussed.  Follow up in 3 months.   Follow Up Instructions:    I discussed the assessment and treatment plan with the patient. The patient was provided an opportunity to ask questions and all were answered. The patient agreed with the plan and demonstrated an understanding of the instructions.   The patient was advised to call back or seek an in-person evaluation if the symptoms worsen or if the condition fails to improve as anticipated.  I provided 10 minutes of non-face-to-face time during this encounter.   Tandy Gaw, PA-C

## 2022-08-02 ENCOUNTER — Other Ambulatory Visit (HOSPITAL_BASED_OUTPATIENT_CLINIC_OR_DEPARTMENT_OTHER): Payer: Self-pay

## 2022-08-02 ENCOUNTER — Telehealth: Payer: Self-pay

## 2022-08-02 MED ORDER — PHENTERMINE-TOPIRAMATE ER 7.5-46 MG PO CP24
1.0000 | ORAL_CAPSULE | Freq: Every morning | ORAL | 0 refills | Status: DC
  Filled 2022-08-02: qty 90, 90d supply, fill #0

## 2022-08-02 MED ORDER — PHENTERMINE-TOPIRAMATE ER 3.75-23 MG PO CP24
1.0000 | ORAL_CAPSULE | Freq: Every morning | ORAL | 0 refills | Status: DC
  Filled 2022-08-02: qty 14, 14d supply, fill #0

## 2022-08-02 NOTE — Telephone Encounter (Signed)
Sent message to patient

## 2022-08-02 NOTE — Telephone Encounter (Signed)
This request has not been approved. Based on the information sent for review, the requested drug did not meet our guideline rules. To get the request approved, your doctor needs to show that you have met the guideline rules below. If you have questions, please call your doctor. In some cases, the requested drug or alternatives offered may have other guideline rules that need to be met. Your provider requested Qsymia 11.25-69mg  capsules for weight loss or weight management. For initial approval for weight loss or weight management, our guideline named ANTI-OBESITY AGENTS (reviewed for Qsymia 11.25-69mg  capsules) only allows for approval of Qsymia 3.75-23mg  capsules for 14 days followed by a dose increase to Qsymia 7.5-46mg  capsules for 3 months. Please note that prior authorizations have been approved for Qsymia 3.75-23mg  capsules for 2 weeks with a quantity limit of one (1) capsule per day and for Qsymia 7.5-46mg  capsules for 3 months with a quantity limit of one (1) capsule per day.Please work with your doctor to use a different medication, such as Qsymia 3.75-23mg  capsules, or get [place] more information if it will allow [place] to approve this request. A written notification letter will follow with additional details

## 2022-08-03 ENCOUNTER — Other Ambulatory Visit (HOSPITAL_BASED_OUTPATIENT_CLINIC_OR_DEPARTMENT_OTHER): Payer: Self-pay

## 2022-08-03 NOTE — Telephone Encounter (Signed)
Can you do the PA for the 3.75-23 mg?

## 2022-08-04 ENCOUNTER — Other Ambulatory Visit (HOSPITAL_BASED_OUTPATIENT_CLINIC_OR_DEPARTMENT_OTHER): Payer: Self-pay

## 2022-08-05 ENCOUNTER — Telehealth: Payer: Self-pay

## 2022-08-05 NOTE — Telephone Encounter (Addendum)
Initiated Prior authorization ZOX:WRUEAV 3.75-23MG  er capsules Via: Covermymeds Case/Key:[id number] Status: approved   as of 08/22/22 Reason:approved coverage for Qsymia 3.75-23MG  er capsules,Qsymia 7.5-46MG  er capsules are approved  08/23/22/- 7/16/    and 09/03/22-12/02/22.Notified Pt via: Mychart  Resubmission placed

## 2022-08-08 ENCOUNTER — Other Ambulatory Visit (HOSPITAL_BASED_OUTPATIENT_CLINIC_OR_DEPARTMENT_OTHER): Payer: Self-pay

## 2022-08-09 ENCOUNTER — Other Ambulatory Visit (HOSPITAL_BASED_OUTPATIENT_CLINIC_OR_DEPARTMENT_OTHER): Payer: Self-pay

## 2022-08-09 NOTE — Telephone Encounter (Signed)
I called today. The PA is still pending.

## 2022-08-09 NOTE — Telephone Encounter (Signed)
Awaiting new PA.   A previously denied or partially denied PA request has been located for this member. To initiate an appeal or reconsideration please call [phone]. Thank you.

## 2022-08-22 ENCOUNTER — Other Ambulatory Visit (HOSPITAL_BASED_OUTPATIENT_CLINIC_OR_DEPARTMENT_OTHER): Payer: Self-pay

## 2022-09-01 ENCOUNTER — Other Ambulatory Visit (HOSPITAL_BASED_OUTPATIENT_CLINIC_OR_DEPARTMENT_OTHER): Payer: Self-pay

## 2022-09-02 NOTE — Telephone Encounter (Signed)
Patient informed. 

## 2022-09-15 ENCOUNTER — Other Ambulatory Visit (HOSPITAL_BASED_OUTPATIENT_CLINIC_OR_DEPARTMENT_OTHER): Payer: Self-pay

## 2022-10-19 ENCOUNTER — Ambulatory Visit (INDEPENDENT_AMBULATORY_CARE_PROVIDER_SITE_OTHER): Payer: 59 | Admitting: Physician Assistant

## 2022-10-19 ENCOUNTER — Other Ambulatory Visit: Payer: Self-pay | Admitting: Physician Assistant

## 2022-10-19 ENCOUNTER — Encounter: Payer: Self-pay | Admitting: Physician Assistant

## 2022-10-19 ENCOUNTER — Other Ambulatory Visit (HOSPITAL_BASED_OUTPATIENT_CLINIC_OR_DEPARTMENT_OTHER): Payer: Self-pay

## 2022-10-19 VITALS — BP 118/86 | HR 72 | Wt 168.0 lb

## 2022-10-19 DIAGNOSIS — Z0000 Encounter for general adult medical examination without abnormal findings: Secondary | ICD-10-CM | POA: Diagnosis not present

## 2022-10-19 DIAGNOSIS — Z131 Encounter for screening for diabetes mellitus: Secondary | ICD-10-CM

## 2022-10-19 DIAGNOSIS — Z13220 Encounter for screening for lipoid disorders: Secondary | ICD-10-CM | POA: Diagnosis not present

## 2022-10-19 DIAGNOSIS — G43011 Migraine without aura, intractable, with status migrainosus: Secondary | ICD-10-CM | POA: Diagnosis not present

## 2022-10-19 DIAGNOSIS — Z23 Encounter for immunization: Secondary | ICD-10-CM | POA: Diagnosis not present

## 2022-10-19 MED ORDER — RIZATRIPTAN BENZOATE 10 MG PO TABS
10.0000 mg | ORAL_TABLET | ORAL | 3 refills | Status: AC | PRN
Start: 2022-10-19 — End: ?
  Filled 2022-10-19: qty 10, 17d supply, fill #0

## 2022-10-19 NOTE — Patient Instructions (Signed)

## 2022-10-19 NOTE — Progress Notes (Signed)
Complete physical exam  Patient: Vanessa Winters   DOB: 22-Dec-1995   26 y.o. Female  MRN: [id number]  Subjective:    No chief complaint on file.   Malibu Arizola is a 27 y.o. female who presents today for a complete physical exam. She reports consuming a general and eats a lot of fresh and frozen foods for  diet.  She does at least 30 minutes to 1 hour of exercise a day.  She generally feels well. She reports sleeping well. She does not have additional problems to discuss today.   Migraines about once a week. Maxalt rescues well.  Off OCP and trying to have another baby. She is on pre-natal.   Most recent fall risk assessment:    10/19/2022   12:05 PM  Fall Risk   Falls in the past year? 0  Number falls in past yr: 0  Injury with Fall? 0  Risk for fall due to : No Fall Risks  Follow up Falls evaluation completed     Most recent depression screenings:    04/06/2022    1:05 PM 10/08/2021    9:42 AM  PHQ 2/9 Scores  PHQ - 2 Score 0 2  PHQ- 9 Score  11    Vision:Within last year and Dental: No current dental problems and Receives regular dental care  Patient Active Problem List   Diagnosis Date Noted   Class 1 obesity due to excess calories without serious comorbidity with body mass index (BMI) of 33.0 to 33.9 in adult 04/06/2022   Abnormal weight gain 04/06/2022   Hot flashes 04/06/2022   Depressed mood 10/08/2021   Mixed obsessional thoughts and acts 10/08/2021   Left ankle pain 05/30/2021   Hives 04/06/2021   Intractable migraine without aura and with status migrainosus 04/06/2021   Birth control counseling 03/08/2021   Encounter for induction of labor 05/13/2020   SVD (spontaneous vaginal delivery) 05/13/2020   Postpartum care following vaginal delivery 3/23 05/13/2020   Perineal laceration, second degree 05/13/2020   Attention deficit hyperactivity disorder (ADHD), combined type 01/08/2020   Bipolar 2 disorder (HCC) 06/13/2019   Anxiety 07/25/2018   Past Medical  History:  Diagnosis Date   Colon polyps    Discharge from right nipple 07/2016   GERD (gastroesophageal reflux disease)    Hyperlipidemia    Migraines    Seasonal allergies    Allergies  Allergen Reactions   Adhesive [Tape] Other (See Comments)    STERI-STRIPS:  BLISTERS   Buspar [Buspirone] Other (See Comments)    Constipation.       Patient Care Team: Nolene Ebbs as PCP - General (Family Medicine) Olivia Mackie, MD as Consulting Physician (Obstetrics and Gynecology)   Outpatient Medications Prior to Visit  Medication Sig   [DISCONTINUED] clonazePAM (KLONOPIN) 0.5 MG tablet Take 1 tablet (0.5 mg total) by mouth 2 (two) times daily as needed for anxiety.   [DISCONTINUED] Norethindrone Acetate-Ethinyl Estradiol (LOESTRIN) 1.5-30 MG-MCG tablet Take 1 tablet by mouth daily.   [DISCONTINUED] Phentermine-Topiramate 3.75-23 MG CP24 Take 1 capsule by mouth every morning.   [DISCONTINUED] Phentermine-Topiramate 7.5-46 MG CP24 Take 1 capsule by mouth every morning.   [DISCONTINUED] rizatriptan (MAXALT) 10 MG tablet Take 1 tablet (10 mg total) by mouth as needed for migraine. May repeat in 2 hours if needed   No facility-administered medications prior to visit.    Review of Systems  All other systems reviewed and are negative.  Objective:     BP 118/86   Pulse 72   Wt 168 lb (76.2 kg)   BMI 31.74 kg/m  BP Readings from Last 3 Encounters:  10/19/22 118/86  04/06/22 122/72  10/13/21 101/72   Wt Readings from Last 3 Encounters:  10/19/22 168 lb (76.2 kg)  07/27/22 176 lb (79.8 kg)  04/06/22 166 lb (75.3 kg)      Physical Exam  BP 118/86   Pulse 72   Wt 168 lb (76.2 kg)   BMI 31.74 kg/m   General Appearance:    Alert, cooperative, no distress, appears stated age  Head:    Normocephalic, without obvious abnormality, atraumatic  Eyes:    PERRL, conjunctiva/corneas clear, EOM's intact, fundi    benign, both eyes  Ears:    Normal TM's and  external ear canals, both ears  Nose:   Nares normal, septum midline, mucosa normal, no drainage    or sinus tenderness  Throat:   Lips, mucosa, and tongue normal; teeth and gums normal  Neck:   Supple, symmetrical, trachea midline, no adenopathy;    thyroid:  no enlargement/tenderness/nodules; no carotid   bruit or JVD  Back:     Symmetric, no curvature, ROM normal, no CVA tenderness  Lungs:     Clear to auscultation bilaterally, respirations unlabored  Chest Wall:    No tenderness or deformity   Heart:    Regular rate and rhythm, S1 and S2 normal, no murmur, rub   or gallop     Abdomen:     Soft, non-tender, bowel sounds active all four quadrants,    no masses, no organomegaly        Extremities:   Extremities normal, atraumatic, no cyanosis or edema  Pulses:   2+ and symmetric all extremities  Skin:   Skin color, texture, turgor normal, no rashes or lesions  Lymph nodes:   Cervical, supraclavicular, and axillary nodes normal  Neurologic:   CNII-XII intact, normal strength, sensation and reflexes    throughout      Assessment & Plan:    Routine Health Maintenance and Physical Exam  Immunization History  Administered Date(s) Administered   DTaP 03/15/1994, 05/02/1994, 07/29/1994, 03/07/1995, 07/22/1999   HIB (PRP-OMP) 03/15/1994, 05/02/1994, 07/29/1994, 03/07/1995   Hepatitis B 09-16-1995, 03/15/1994, 07/29/1994   Hepatitis B, ADULT 06/13/2014, 07/11/2014, 12/05/2014   IPV 03/15/1994, 05/02/1994, 07/29/1994, 07/22/1999   Influenza, Seasonal, Injecte, Preservative Fre 10/19/2022   Influenza,inj,Quad PF,6+ Mos 12/12/2019, 12/18/2020, 12/07/2021   Influenza-Unspecified 11/05/2016, 11/21/2017, 11/06/2018   MMR 03/07/1995, 07/22/1999   Moderna Covid-19 Vaccine Bivalent Booster 34yrs & up 01/06/2021, 01/06/2021   PFIZER(Purple Top)SARS-COV-2 Vaccination 04/27/2019, 06/01/2019   Tdap 02/22/2011, 04/21/2011, 04/21/2011, 02/23/2020    Health Maintenance  Topic Date Due    PAP-Cervical Cytology Screening  08/21/2022   PAP SMEAR-Modifier  08/21/2022   COVID-19 Vaccine (5 - 2023-24 season) 11/04/2022 (Originally 10/22/2021)   DTaP/Tdap/Td (10 - Td or Tdap) 02/22/2030   INFLUENZA VACCINE  Completed   Hepatitis C Screening  Completed   HIV Screening  Completed   HPV VACCINES  Aged Out    Discussed health benefits of physical activity, and encouraged her to engage in regular exercise appropriate for her age and condition.  Marland Kitchen.Diagnoses and all orders for this visit:  Routine physical examination -     Lipid panel -     CMP14+EGFR -     CBC w/Diff/Platelet  Intractable migraine without aura and with status migrainosus -  rizatriptan (MAXALT) 10 MG tablet; Take 1 tablet (10 mg total) by mouth as needed for migraine. May repeat in 2 hours if needed  Screening for diabetes mellitus -     CMP14+EGFR  Screening for lipid disorders -     Lipid panel  Encounter for immunization -     Flu vaccine trivalent PF, 6mos and older(Flulaval,Afluria,Fluarix,Fluzone)   .Marland Kitchen Discussed 150 minutes of exercise a week.  Encouraged vitamin D 1000 units and Calcium 1300mg  or 4 servings of dairy a day.  Pt trying to concieve On prenatal vitamins Will schedule pap with GYN Fasting labs ordered today Maxalt refilled for rescue Flu shot given today   Return in about 1 year (around 10/19/2023).     Tandy Gaw, PA-C

## 2022-10-20 LAB — CBC WITH DIFFERENTIAL/PLATELET
Basophils Absolute: 0 10*3/uL (ref 0.0–0.2)
Basos: 0 %
EOS (ABSOLUTE): 0 10*3/uL (ref 0.0–0.4)
Eos: 1 %
Hematocrit: 41.7 % (ref 34.0–46.6)
Hemoglobin: 14 g/dL (ref 11.1–15.9)
Immature Grans (Abs): 0 10*3/uL (ref 0.0–0.1)
Immature Granulocytes: 1 %
Lymphocytes Absolute: 2.6 10*3/uL (ref 0.7–3.1)
Lymphs: 42 %
MCH: 31 pg (ref 26.6–33.0)
MCHC: 33.6 g/dL (ref 31.5–35.7)
MCV: 93 fL (ref 79–97)
Monocytes Absolute: 0.4 10*3/uL (ref 0.1–0.9)
Monocytes: 6 %
Neutrophils Absolute: 3.2 10*3/uL (ref 1.4–7.0)
Neutrophils: 50 %
Platelets: 281 10*3/uL (ref 150–450)
RBC: 4.51 x10E6/uL (ref 3.77–5.28)
RDW: 12 % (ref 11.7–15.4)
WBC: 6.2 10*3/uL (ref 3.4–10.8)

## 2022-10-20 LAB — LIPID PANEL
Chol/HDL Ratio: 6.8 ratio — ABNORMAL HIGH (ref 0.0–4.4)
Cholesterol, Total: 328 mg/dL — ABNORMAL HIGH (ref 100–199)
HDL: 48 mg/dL (ref >39)
LDL Chol Calc (NIH): 269 mg/dL — ABNORMAL HIGH (ref 0–99)
Triglycerides: 76 mg/dL (ref 0–149)
VLDL Cholesterol Cal: 11 mg/dL (ref 5–40)

## 2022-10-20 LAB — CMP14+EGFR
ALT: 9 IU/L (ref 0–32)
AST: 18 IU/L (ref 0–40)
Albumin: 4.5 g/dL (ref 4.0–5.0)
Alkaline Phosphatase: 67 IU/L (ref 44–121)
BUN/Creatinine Ratio: 12 (ref 9–23)
BUN: 10 mg/dL (ref 6–20)
Bilirubin Total: 0.4 mg/dL (ref 0.0–1.2)
CO2: 22 mmol/L (ref 20–29)
Calcium: 9.2 mg/dL (ref 8.7–10.2)
Chloride: 104 mmol/L (ref 96–106)
Creatinine, Ser: 0.81 mg/dL (ref 0.57–1.00)
Globulin, Total: 2.3 g/dL (ref 1.5–4.5)
Glucose: 87 mg/dL (ref 70–99)
Potassium: 4.2 mmol/L (ref 3.5–5.2)
Sodium: 139 mmol/L (ref 134–144)
Total Protein: 6.8 g/dL (ref 6.0–8.5)
eGFR: 101 mL/min/{1.73_m2} (ref >59)

## 2022-10-20 NOTE — Progress Notes (Signed)
Cholesterol really elevated. I do suggest statin when not pregnant!  All other labs look good.

## 2022-10-25 ENCOUNTER — Encounter: Payer: Self-pay | Admitting: Physician Assistant

## 2022-11-01 ENCOUNTER — Encounter: Payer: Self-pay | Admitting: Physician Assistant

## 2022-11-01 ENCOUNTER — Ambulatory Visit (INDEPENDENT_AMBULATORY_CARE_PROVIDER_SITE_OTHER): Payer: 59 | Admitting: Physician Assistant

## 2022-11-01 VITALS — BP 123/68 | HR 70 | Temp 99.2°F | Wt 168.0 lb

## 2022-11-01 DIAGNOSIS — J029 Acute pharyngitis, unspecified: Secondary | ICD-10-CM | POA: Diagnosis not present

## 2022-11-01 DIAGNOSIS — M542 Cervicalgia: Secondary | ICD-10-CM | POA: Diagnosis not present

## 2022-11-01 LAB — POCT RAPID STREP A (OFFICE): Rapid Strep A Screen: NEGATIVE

## 2022-11-01 MED ORDER — METHYLPREDNISOLONE 4 MG PO TBPK
ORAL_TABLET | ORAL | 0 refills | Status: AC
Start: 2022-11-01 — End: ?

## 2022-11-01 MED ORDER — AMOXICILLIN-POT CLAVULANATE 875-125 MG PO TABS
1.0000 | ORAL_TABLET | Freq: Two times a day (BID) | ORAL | 0 refills | Status: AC
Start: 2022-11-01 — End: ?

## 2022-11-01 NOTE — Progress Notes (Signed)
Acute Office Visit  Subjective:     Patient ID: Vanessa Winters, female    DOB: 07-24-1995, 27 y.o.   MRN: [id number]  Chief Complaint  Patient presents with   Neck Pain    HPI Patient is in today for left neck pain to palpation and when she turns her head for the last 24 hours. She denies any fever, sinus pressure, cough, ear pain or body aches. She has had some nasal congestion and post nasal drip for a few weeks. Overall she feels good. Not taking anything for symptoms. Her neck pain is severe when touching or turning her head. No problems swallowing.    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   Encounter for induction of labor 05/13/2020   SVD (spontaneous vaginal delivery) 05/13/2020   Postpartum care following vaginal delivery 3/23 05/13/2020   Perineal laceration, second degree 05/13/2020   Birth control counseling 03/08/2021   Hives 04/06/2021   Intractable migraine without aura and with status migrainosus 04/06/2021   Left ankle pain 05/30/2021   Depressed mood 10/08/2021   Mixed obsessional thoughts and acts 10/08/2021   Class 1 obesity due to excess calories without serious comorbidity with body mass index (BMI) of 33.0 to 33.9 in adult 04/06/2022   Abnormal weight gain 04/06/2022   Hot flashes 04/06/2022   Resolved Ambulatory Problems    Diagnosis Date Noted   Hyperlipidemia 05/22/2011   History of migraine headaches 02/18/2013   Irregular menses 02/18/2013   Hidradenitis axillaris 11/07/2014   Discharge from right nipple 05/19/2016   Tobacco abuse 07/25/2018   Obsessive-compulsive behavior 07/25/2018   No energy 07/25/2018   Intractable migraine with aura without status migrainosus 07/25/2018   Trouble in sleeping 03/04/2019   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 03/04/2019    Arthralgia 03/04/2019   Hot flashes 03/04/2019   Nicotine withdrawal 03/13/2019   Fidgeting 04/01/2019   Inattention 04/01/2019   Hypoactive bowel sounds 04/10/2019   History of colitis 04/10/2019   Left lower quadrant pain 04/10/2019   Constipation 04/10/2019   Mood changes 04/30/2019   Family history of bipolar disorder 04/30/2019   Urinary incontinence without sensory awareness 06/03/2019   Brain fog 06/03/2019   Frequent headaches 06/03/2019   Dizziness 06/03/2019   Tachycardia 06/03/2019   Vision changes 06/03/2019   Near syncope 06/03/2019   Other symptoms and signs involving the nervous system 06/03/2019   Mixed obsessional thoughts and acts 06/13/2019   Right knee pain 08/05/2019   Hypotension 08/14/2019   Low serum vitamin B12 08/23/2019   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     Objective:    BP 123/68   Pulse 70   Temp 99.2 F (37.3 C) (Oral)   Wt 168 lb (76.2 kg)   BMI 31.74 kg/m  BP Readings from Last 3 Encounters:  11/01/22 123/68  10/19/22 118/86  04/06/22 122/72   Wt Readings from Last 3 Encounters:  11/01/22 168 lb (76.2 kg)  10/19/22 168 lb (76.2 kg)  07/27/22 176 lb (79.8 kg)      Physical Exam Constitutional:      Appearance: Normal appearance.  HENT:     Head: Normocephalic.     Right Ear: Tympanic membrane, ear canal and external ear normal. There is no impacted cerumen.     Left Ear: Tympanic membrane, ear canal and external ear normal. There is no impacted cerumen.      Nose: Nose normal. No congestion or rhinorrhea.     Mouth/Throat:     Mouth: Mucous membranes are moist.     Pharynx: Posterior oropharyngeal erythema present. No oropharyngeal exudate.     Comments: Right tonsil enlargement without exudate Oropharynx erythematous Eyes:     General:        Right eye: No discharge.        Left eye: No discharge.     Extraocular Movements: Extraocular movements intact.  Neck:     Vascular: No carotid bruit.     Comments: Left neck tenderness but no enlarged lymph node or mass Cardiovascular:     Rate and Rhythm: Normal rate and regular rhythm.  Pulmonary:     Effort: Pulmonary effort is normal.     Breath sounds: Normal breath sounds.  Musculoskeletal:     Cervical back: Neck supple. Tenderness present. No rigidity.  Lymphadenopathy:     Cervical: No cervical adenopathy.  Neurological:     Mental Status: She is alert.    Uvula midline.   Results for orders placed or performed in visit on 11/01/22  POCT rapid strep A  Result Value Ref Range   Rapid Strep A Screen Negative Negative        Assessment & Plan:  Marland KitchenMarland KitchenLeana was seen today for neck pain.  Diagnoses and all orders for this visit:  Neck pain on left side -     methylPREDNISolone (MEDROL DOSEPAK) 4 MG TBPK tablet; Take as directed by package insert. -     amoxicillin-clavulanate (AUGMENTIN) 875-125 MG tablet; Take 1 tablet by mouth 2 (two) times daily. -     POCT rapid strep A  Acute pharyngitis, unspecified etiology -     methylPREDNISolone (MEDROL DOSEPAK) 4 MG TBPK tablet; Take as directed by package insert. -     amoxicillin-clavulanate (AUGMENTIN) 875-125 MG tablet; Take 1 tablet by mouth 2 (two) times daily. -     POCT rapid strep A   Negative for strep  No signs of bacterial infection or lymph node enlargement  but there is significant pain to palpation over left neck No signs of tonsillar abscess Medrol dose for inflammation Hold augmentin but she is going on  trip and if symptoms progress may need to start Follow up if symptoms worsen or change     Return if symptoms worsen or fail to improve.  Tandy Gaw, PA-C

## 2022-11-01 NOTE — Patient Instructions (Signed)

## 2022-12-05 DIAGNOSIS — Z3200 Encounter for pregnancy test, result unknown: Secondary | ICD-10-CM | POA: Diagnosis not present

## 2022-12-05 LAB — OB RESULTS CONSOLE ANTIBODY SCREEN: Antibody Screen: NEGATIVE

## 2023-01-03 DIAGNOSIS — Z3201 Encounter for pregnancy test, result positive: Secondary | ICD-10-CM | POA: Diagnosis not present

## 2023-01-17 DIAGNOSIS — Z3481 Encounter for supervision of other normal pregnancy, first trimester: Secondary | ICD-10-CM | POA: Diagnosis not present

## 2023-01-17 DIAGNOSIS — Z3689 Encounter for other specified antenatal screening: Secondary | ICD-10-CM | POA: Diagnosis not present

## 2023-01-17 DIAGNOSIS — Z36 Encounter for antenatal screening for chromosomal anomalies: Secondary | ICD-10-CM | POA: Diagnosis not present

## 2023-01-17 LAB — OB RESULTS CONSOLE RUBELLA ANTIBODY, IGM: Rubella: IMMUNE

## 2023-01-17 LAB — OB RESULTS CONSOLE HEPATITIS B SURFACE ANTIGEN: Hepatitis B Surface Ag: NEGATIVE

## 2023-01-17 LAB — OB RESULTS CONSOLE HIV ANTIBODY (ROUTINE TESTING): HIV: NONREACTIVE

## 2023-01-17 LAB — OB RESULTS CONSOLE RPR: RPR: NONREACTIVE

## 2023-02-16 LAB — OB RESULTS CONSOLE GC/CHLAMYDIA
Chlamydia: NEGATIVE
Neisseria Gonorrhea: NEGATIVE

## 2023-02-22 NOTE — L&D Delivery Note (Signed)
   Delivery Note:   [id number] at [redacted]w[redacted]d  Admitting diagnosis: Encounter for induction of labor [Z34.90] Risks: Suspected LGA, >95% Onset of labor: 08/06/2023 at 1125 IOL/Augmentation: AROM, Pitocin , Cytotec , and IP Foley ROM: 08/06/2023 at 1400, clear fluid  Complete dilation at 08/06/2023 1921 Onset of pushing at 1925 FHR second stage Cat II, variables  Analgesia/Anesthesia intrapartum:Epidural Pushing in lithotomy position with CNM and L&D staff support. Husband, Tura Gaines, present for birth and supportive.  Delivery of a Live born female  Birth Weight:  pending APGAR: 8, 9   Newborn Delivery   Birth date/time: 08/06/2023 19:58:40 Delivery type: Vaginal, Spontaneous    in cephalic presentation, position OA to ROA.  APGAR:1 min-8 , 5 min-9   Nuchal Cord: Yes  x 1 Cord double clamped after cessation of pulsation, cut by Alec.  Collection of cord blood for typing completed. Arterial cord blood sample-No   Placenta delivered-Spontaneous with 3 vessels. Uterotonics: Pitocin  Placenta to L&D Uterine tone firm  Bleeding small  None laceration identified.  Episiotomy:None Local analgesia: N/A  Repair: one small skid mark, placed a figure of eight for good hemostasis Est. Blood Loss (mL):417.00  Complications: None  Mom to postpartum. Baby Melodee Spruce to Couplet care / Skin to Skin.  Delivery Report:   Review the Delivery Report for details.    Joel Murphy, CNM, MSN 08/07/2023, 8:34 AM

## 2023-03-02 DIAGNOSIS — Z361 Encounter for antenatal screening for raised alphafetoprotein level: Secondary | ICD-10-CM | POA: Diagnosis not present

## 2023-03-23 DIAGNOSIS — O358XX0 Maternal care for other (suspected) fetal abnormality and damage, not applicable or unspecified: Secondary | ICD-10-CM | POA: Diagnosis not present

## 2023-03-23 DIAGNOSIS — Z3A19 19 weeks gestation of pregnancy: Secondary | ICD-10-CM | POA: Diagnosis not present

## 2023-04-13 DIAGNOSIS — Z3482 Encounter for supervision of other normal pregnancy, second trimester: Secondary | ICD-10-CM | POA: Diagnosis not present

## 2023-04-13 DIAGNOSIS — Z3483 Encounter for supervision of other normal pregnancy, third trimester: Secondary | ICD-10-CM | POA: Diagnosis not present

## 2023-05-16 DIAGNOSIS — O26843 Uterine size-date discrepancy, third trimester: Secondary | ICD-10-CM | POA: Diagnosis not present

## 2023-05-16 DIAGNOSIS — Z3689 Encounter for other specified antenatal screening: Secondary | ICD-10-CM | POA: Diagnosis not present

## 2023-05-16 DIAGNOSIS — Z3A27 27 weeks gestation of pregnancy: Secondary | ICD-10-CM | POA: Diagnosis not present

## 2023-05-31 DIAGNOSIS — Z3483 Encounter for supervision of other normal pregnancy, third trimester: Secondary | ICD-10-CM | POA: Diagnosis not present

## 2023-05-31 DIAGNOSIS — Z3689 Encounter for other specified antenatal screening: Secondary | ICD-10-CM | POA: Diagnosis not present

## 2023-05-31 DIAGNOSIS — Z3A29 29 weeks gestation of pregnancy: Secondary | ICD-10-CM | POA: Diagnosis not present

## 2023-06-14 DIAGNOSIS — Z3A31 31 weeks gestation of pregnancy: Secondary | ICD-10-CM | POA: Diagnosis not present

## 2023-06-14 DIAGNOSIS — O3660X0 Maternal care for excessive fetal growth, unspecified trimester, not applicable or unspecified: Secondary | ICD-10-CM | POA: Diagnosis not present

## 2023-07-11 DIAGNOSIS — Z3493 Encounter for supervision of normal pregnancy, unspecified, third trimester: Secondary | ICD-10-CM | POA: Diagnosis not present

## 2023-07-11 DIAGNOSIS — Z3685 Encounter for antenatal screening for Streptococcus B: Secondary | ICD-10-CM | POA: Diagnosis not present

## 2023-07-11 DIAGNOSIS — O418X30 Other specified disorders of amniotic fluid and membranes, third trimester, not applicable or unspecified: Secondary | ICD-10-CM | POA: Diagnosis not present

## 2023-07-11 DIAGNOSIS — Z3A35 35 weeks gestation of pregnancy: Secondary | ICD-10-CM | POA: Diagnosis not present

## 2023-07-11 DIAGNOSIS — O3663X0 Maternal care for excessive fetal growth, third trimester, not applicable or unspecified: Secondary | ICD-10-CM | POA: Diagnosis not present

## 2023-07-11 LAB — OB RESULTS CONSOLE GBS: GBS: NEGATIVE

## 2023-07-19 DIAGNOSIS — Z3A36 36 weeks gestation of pregnancy: Secondary | ICD-10-CM | POA: Diagnosis not present

## 2023-07-19 DIAGNOSIS — O3663X0 Maternal care for excessive fetal growth, third trimester, not applicable or unspecified: Secondary | ICD-10-CM | POA: Diagnosis not present

## 2023-07-27 ENCOUNTER — Encounter (HOSPITAL_COMMUNITY): Payer: Self-pay | Admitting: *Deleted

## 2023-07-27 ENCOUNTER — Telehealth (HOSPITAL_COMMUNITY): Payer: Self-pay | Admitting: *Deleted

## 2023-07-27 NOTE — Telephone Encounter (Signed)
 Preadmission screen

## 2023-08-03 ENCOUNTER — Encounter (HOSPITAL_COMMUNITY): Payer: Self-pay | Admitting: *Deleted

## 2023-08-03 DIAGNOSIS — O3660X0 Maternal care for excessive fetal growth, unspecified trimester, not applicable or unspecified: Secondary | ICD-10-CM | POA: Diagnosis not present

## 2023-08-03 DIAGNOSIS — Z3A38 38 weeks gestation of pregnancy: Secondary | ICD-10-CM | POA: Diagnosis not present

## 2023-08-06 ENCOUNTER — Encounter (HOSPITAL_COMMUNITY): Payer: Self-pay | Admitting: Family Medicine

## 2023-08-06 ENCOUNTER — Inpatient Hospital Stay (HOSPITAL_COMMUNITY): Admitting: Anesthesiology

## 2023-08-06 ENCOUNTER — Inpatient Hospital Stay (HOSPITAL_COMMUNITY)

## 2023-08-06 ENCOUNTER — Inpatient Hospital Stay (HOSPITAL_COMMUNITY)
Admission: RE | Admit: 2023-08-06 | Discharge: 2023-08-08 | DRG: 806 | Disposition: A | Discharge status: Home / Self Care | Attending: Family Medicine | Admitting: Family Medicine

## 2023-08-06 DIAGNOSIS — Z87891 Personal history of nicotine dependence: Secondary | ICD-10-CM

## 2023-08-06 DIAGNOSIS — O3663X0 Maternal care for excessive fetal growth, third trimester, not applicable or unspecified: Secondary | ICD-10-CM | POA: Diagnosis not present

## 2023-08-06 DIAGNOSIS — Z3490 Encounter for supervision of normal pregnancy, unspecified, unspecified trimester: Secondary | ICD-10-CM | POA: Diagnosis present

## 2023-08-06 DIAGNOSIS — D62 Acute posthemorrhagic anemia: Secondary | ICD-10-CM | POA: Diagnosis not present

## 2023-08-06 DIAGNOSIS — Z412 Encounter for routine and ritual male circumcision: Secondary | ICD-10-CM | POA: Diagnosis not present

## 2023-08-06 DIAGNOSIS — O9962 Diseases of the digestive system complicating childbirth: Secondary | ICD-10-CM | POA: Diagnosis not present

## 2023-08-06 DIAGNOSIS — K219 Gastro-esophageal reflux disease without esophagitis: Secondary | ICD-10-CM | POA: Diagnosis not present

## 2023-08-06 DIAGNOSIS — O9081 Anemia of the puerperium: Secondary | ICD-10-CM | POA: Diagnosis not present

## 2023-08-06 DIAGNOSIS — F419 Anxiety disorder, unspecified: Secondary | ICD-10-CM | POA: Diagnosis present

## 2023-08-06 DIAGNOSIS — Z8249 Family history of ischemic heart disease and other diseases of the circulatory system: Secondary | ICD-10-CM | POA: Diagnosis not present

## 2023-08-06 DIAGNOSIS — Z3A39 39 weeks gestation of pregnancy: Secondary | ICD-10-CM

## 2023-08-06 LAB — CBC
HCT: 31.8 % — ABNORMAL LOW (ref 36.0–46.0)
Hemoglobin: 10.4 g/dL — ABNORMAL LOW (ref 12.0–15.0)
MCH: 28 pg (ref 26.0–34.0)
MCHC: 32.7 g/dL (ref 30.0–36.0)
MCV: 85.7 fL (ref 80.0–100.0)
Platelets: 270 10*3/uL (ref 150–400)
RBC: 3.71 MIL/uL — ABNORMAL LOW (ref 3.87–5.11)
RDW: 12.5 % (ref 11.5–15.5)
WBC: 9.9 10*3/uL (ref 4.0–10.5)
nRBC: 0 % (ref 0.0–0.2)

## 2023-08-06 LAB — TYPE AND SCREEN
ABO/RH(D): O POS
Antibody Screen: NEGATIVE

## 2023-08-06 LAB — RPR: RPR Ser Ql: NONREACTIVE

## 2023-08-06 MED ORDER — LACTATED RINGERS AMNIOINFUSION: INTRAVENOUS | Status: DC

## 2023-08-06 MED ORDER — OXYTOCIN 10 UNIT/ML IJ SOLN: 10.0000 [IU] | Freq: Once | INTRAMUSCULAR | Status: DC

## 2023-08-06 MED ORDER — LACTATED RINGERS IV SOLN
500.0000 mL | INTRAVENOUS | Status: DC | PRN
  Administered 2023-08-06: 500 mL via INTRAVENOUS

## 2023-08-06 MED ORDER — SODIUM CHLORIDE 0.9 % IV SOLN: INTRAVENOUS | Status: DC | PRN

## 2023-08-06 MED ORDER — DIPHENHYDRAMINE HCL 50 MG/ML IJ SOLN: 12.5000 mg | INTRAMUSCULAR | Status: DC | PRN

## 2023-08-06 MED ORDER — EPHEDRINE 5 MG/ML INJ: 10.0000 mg | INTRAVENOUS | Status: DC | PRN

## 2023-08-06 MED ORDER — DIBUCAINE (PERIANAL) 1 % EX OINT: 1.0000 | TOPICAL_OINTMENT | CUTANEOUS | Status: DC | PRN

## 2023-08-06 MED ORDER — COCONUT OIL OIL: 1.0000 | TOPICAL_OIL | Status: DC | PRN

## 2023-08-06 MED ORDER — PANTOPRAZOLE SODIUM 40 MG IV SOLR
40.0000 mg | Freq: Once | INTRAVENOUS | Status: DC
  Filled 2023-08-06: qty 10

## 2023-08-06 MED ORDER — DIPHENHYDRAMINE HCL 25 MG PO CAPS: 25.0000 mg | ORAL_CAPSULE | Freq: Four times a day (QID) | ORAL | Status: DC | PRN

## 2023-08-06 MED ORDER — ACETAMINOPHEN 325 MG PO TABS: 650.0000 mg | ORAL_TABLET | ORAL | Status: DC | PRN

## 2023-08-06 MED ORDER — SENNOSIDES-DOCUSATE SODIUM 8.6-50 MG PO TABS
2.0000 | ORAL_TABLET | Freq: Every day | ORAL | Status: DC
  Administered 2023-08-07: 2 via ORAL
  Filled 2023-08-06: qty 2

## 2023-08-06 MED ORDER — OXYTOCIN BOLUS FROM INFUSION
333.0000 mL | Freq: Once | INTRAVENOUS | Status: AC
  Administered 2023-08-06: 333 mL via INTRAVENOUS

## 2023-08-06 MED ORDER — FENTANYL CITRATE (PF) 100 MCG/2ML IJ SOLN
50.0000 ug | INTRAMUSCULAR | Status: DC | PRN
  Administered 2023-08-06: 100 ug via INTRAVENOUS
  Filled 2023-08-06: qty 2

## 2023-08-06 MED ORDER — ONDANSETRON HCL 4 MG/2ML IJ SOLN: 4.0000 mg | INTRAMUSCULAR | Status: DC | PRN

## 2023-08-06 MED ORDER — SOD CITRATE-CITRIC ACID 500-334 MG/5ML PO SOLN: 30.0000 mL | ORAL | Status: DC | PRN

## 2023-08-06 MED ORDER — CALCIUM CARBONATE ANTACID 500 MG PO CHEW
2.0000 | CHEWABLE_TABLET | Freq: Once | ORAL | Status: DC | PRN
  Filled 2023-08-06: qty 2

## 2023-08-06 MED ORDER — PHENYLEPHRINE 80 MCG/ML (10ML) SYRINGE FOR IV PUSH (FOR BLOOD PRESSURE SUPPORT): 80.0000 ug | PREFILLED_SYRINGE | INTRAVENOUS | Status: DC | PRN

## 2023-08-06 MED ORDER — ACETAMINOPHEN 500 MG PO TABS
1000.0000 mg | ORAL_TABLET | Freq: Four times a day (QID) | ORAL | Status: DC | PRN
Start: 2023-08-06 — End: 2023-08-06
  Administered 2023-08-06: 1000 mg via ORAL
  Filled 2023-08-06: qty 2

## 2023-08-06 MED ORDER — FENTANYL-BUPIVACAINE-NACL 0.5-0.125-0.9 MG/250ML-% EP SOLN
12.0000 mL/h | EPIDURAL | Status: DC | PRN
  Administered 2023-08-06: 12 mL/h via EPIDURAL
  Filled 2023-08-06: qty 250

## 2023-08-06 MED ORDER — BUPIVACAINE HCL (PF) 0.25 % IJ SOLN
INTRAMUSCULAR | Status: DC | PRN
  Administered 2023-08-06 (×2): 5 mL via EPIDURAL

## 2023-08-06 MED ORDER — OXYTOCIN-SODIUM CHLORIDE 30-0.9 UT/500ML-% IV SOLN
1.0000 m[IU]/min | INTRAVENOUS | Status: DC
  Administered 2023-08-06: 2 m[IU]/min via INTRAVENOUS

## 2023-08-06 MED ORDER — PRENATAL MULTIVITAMIN CH
1.0000 | ORAL_TABLET | Freq: Every day | ORAL | Status: DC
  Administered 2023-08-07 – 2023-08-08 (×2): 1 via ORAL
  Filled 2023-08-06 (×2): qty 1

## 2023-08-06 MED ORDER — MISOPROSTOL 50MCG HALF TABLET
50.0000 ug | ORAL_TABLET | Freq: Once | ORAL | Status: AC
  Administered 2023-08-06: 50 ug via BUCCAL
  Filled 2023-08-06: qty 1

## 2023-08-06 MED ORDER — SODIUM CHLORIDE 0.9% FLUSH: 3.0000 mL | Freq: Two times a day (BID) | INTRAVENOUS | Status: DC

## 2023-08-06 MED ORDER — TERBUTALINE SULFATE 1 MG/ML IJ SOLN
0.2500 mg | Freq: Once | INTRAMUSCULAR | Status: AC | PRN
  Administered 2023-08-06: 0.25 mg via SUBCUTANEOUS
  Filled 2023-08-06: qty 1

## 2023-08-06 MED ORDER — AMMONIA AROMATIC IN INHA
RESPIRATORY_TRACT | Status: AC
  Filled 2023-08-06: qty 10

## 2023-08-06 MED ORDER — LIDOCAINE-EPINEPHRINE (PF) 2 %-1:200000 IJ SOLN
INTRAMUSCULAR | Status: DC | PRN
  Administered 2023-08-06: 5 mL via EPIDURAL

## 2023-08-06 MED ORDER — ZOLPIDEM TARTRATE 5 MG PO TABS: 5.0000 mg | ORAL_TABLET | Freq: Every evening | ORAL | Status: DC | PRN

## 2023-08-06 MED ORDER — SIMETHICONE 80 MG PO CHEW: 80.0000 mg | CHEWABLE_TABLET | ORAL | Status: DC | PRN

## 2023-08-06 MED ORDER — ONDANSETRON HCL 4 MG/2ML IJ SOLN
4.0000 mg | Freq: Four times a day (QID) | INTRAMUSCULAR | Status: DC | PRN
  Administered 2023-08-06: 4 mg via INTRAVENOUS
  Filled 2023-08-06: qty 2

## 2023-08-06 MED ORDER — WITCH HAZEL-GLYCERIN EX PADS: 1.0000 | MEDICATED_PAD | CUTANEOUS | Status: DC | PRN

## 2023-08-06 MED ORDER — BENZOCAINE-MENTHOL 20-0.5 % EX AERO
1.0000 | INHALATION_SPRAY | CUTANEOUS | Status: DC | PRN
  Administered 2023-08-07: 1 via TOPICAL
  Filled 2023-08-06: qty 56

## 2023-08-06 MED ORDER — LACTATED RINGERS IV SOLN: INTRAVENOUS | Status: DC

## 2023-08-06 MED ORDER — LIDOCAINE HCL (PF) 1 % IJ SOLN
30.0000 mL | INTRAMUSCULAR | Status: DC | PRN
Start: 2023-08-06 — End: 2023-08-06

## 2023-08-06 MED ORDER — TETANUS-DIPHTH-ACELL PERTUSSIS 5-2.5-18.5 LF-MCG/0.5 IM SUSY: 0.5000 mL | PREFILLED_SYRINGE | Freq: Once | INTRAMUSCULAR | Status: DC

## 2023-08-06 MED ORDER — SODIUM CHLORIDE 0.9% FLUSH: 3.0000 mL | INTRAVENOUS | Status: DC | PRN

## 2023-08-06 MED ORDER — ONDANSETRON HCL 4 MG PO TABS: 4.0000 mg | ORAL_TABLET | ORAL | Status: DC | PRN

## 2023-08-06 MED ORDER — OXYTOCIN-SODIUM CHLORIDE 30-0.9 UT/500ML-% IV SOLN
2.5000 [IU]/h | INTRAVENOUS | Status: DC
  Administered 2023-08-06: 2.5 [IU]/h via INTRAVENOUS
  Filled 2023-08-06: qty 500

## 2023-08-06 MED ORDER — IBUPROFEN 600 MG PO TABS
600.0000 mg | ORAL_TABLET | Freq: Four times a day (QID) | ORAL | Status: DC
  Administered 2023-08-06 – 2023-08-08 (×7): 600 mg via ORAL
  Filled 2023-08-06 (×7): qty 1

## 2023-08-06 MED ORDER — LACTATED RINGERS IV SOLN
500.0000 mL | Freq: Once | INTRAVENOUS | Status: AC
  Administered 2023-08-06: 500 mL via INTRAVENOUS

## 2023-08-06 NOTE — Anesthesia Procedure Notes (Signed)
 Epidural Patient location during procedure: OB Start time: 08/06/2023 1:29 PM End time: 08/06/2023 1:35 PM  Staffing Anesthesiologist: Leslye Rast, MD Performed: anesthesiologist   Preanesthetic Checklist Completed: patient identified, IV checked, site marked, risks and benefits discussed, surgical consent, monitors and equipment checked, pre-op evaluation and timeout performed  Epidural Patient position: sitting Prep: DuraPrep Patient monitoring: heart rate, continuous pulse ox and blood pressure Approach: midline Location: L4-L5 Injection technique: LOR saline  Needle:  Needle type: Tuohy  Needle gauge: 17 G Needle length: 9 cm Needle insertion depth: 6 cm Catheter type: closed end flexible Catheter size: 19 Gauge Catheter at skin depth: 11 cm Test dose: negative and 1.5% lidocaine  with Epi 1:200 K  Assessment Events: blood not aspirated, no cerebrospinal fluid, injection not painful, no injection resistance, no paresthesia and negative IV test  Additional Notes Reason for block:procedure for pain

## 2023-08-06 NOTE — Lactation Note (Signed)
 This note was copied from a baby's chart. Lactation Consultation Note  Patient Name: Vanessa Winters [id number] Date: 08/06/2023 Age:30 hours Reason for consult: Initial assessment;Term  P2- MOB plans to breast feed and offer formula. MOB reports that infant has been nursing well so far. MOB denies experiencing any pain or pinching. MOB requests some Enfamil formula and a manual pump. LC provided these items as well as education about them. MOB declines further lactation assistance and understands that she can still call for assistance as needed. LC reviewed the first 24 hr birthday nap, day 2 cluster feeding, feeding infant on cue 8-12x in 24 hrs, not allowing infant to go over 3 hrs without a feeding, CDC milk storage guidelines, LC services handout and engorgement/breast care. LC encouraged MOB to call for further assistance as needed.  Maternal Data Has patient been taught Hand Expression?: No Does the patient have breastfeeding experience prior to this delivery?: Yes How long did the patient breastfeed?: 9 months of just breast, then an additional 3 months of exclusive pumping  Feeding Mother's Current Feeding Choice: Breast Milk and Formula  Lactation Tools Discussed/Used Tools: Pump;Flanges Flange Size: 24 (per MOB's request) Breast pump type: Manual Pump Education: Setup, frequency, and cleaning;Milk Storage Reason for Pumping: MOB request Pumping frequency: 15-20 min every 3 hrs  Interventions Interventions: Breast feeding basics reviewed;Hand pump;Education;LC Services brochure  Discharge Discharge Education: Engorgement and breast care;Warning signs for feeding baby Pump: DEBP;Manual;Hands Free;Personal  Consult Status Consult Status: Complete (mother declined follow up) Date: 08/06/23    Vanessa Winters BS, IBCLC 08/06/2023, 10:41 PM

## 2023-08-06 NOTE — Anesthesia Preprocedure Evaluation (Signed)
 Anesthesia Evaluation  Patient identified by MRN, date of birth, ID band Patient awake    Reviewed: Allergy & Precautions, NPO status , Patient's Chart, lab work & pertinent test results  Airway Mallampati: III       Dental no notable dental hx.    Pulmonary Patient abstained from smoking., former smoker   Pulmonary exam normal        Cardiovascular Exercise Tolerance: Good (-) angina  Rhythm:Regular Rate:Normal     Neuro/Psych  Headaches, neg Seizures PSYCHIATRIC DISORDERS Anxiety  Bipolar Disorder      GI/Hepatic ,GERD  ,,  Endo/Other    Renal/GU      Musculoskeletal   Abdominal   Peds  Hematology   Anesthesia Other Findings   Reproductive/Obstetrics (+) Pregnancy                             Anesthesia Physical Anesthesia Plan  ASA: 2  Anesthesia Plan: Epidural   Post-op Pain Management:    Induction:   PONV Risk Score and Plan:   Airway Management Planned:   Additional Equipment:   Intra-op Plan:   Post-operative Plan:   Informed Consent: I have reviewed the patients History and Physical, chart, labs and discussed the procedure including the risks, benefits and alternatives for the proposed anesthesia with the patient or authorized representative who has indicated his/her understanding and acceptance.       Plan Discussed with:   Anesthesia Plan Comments:        Anesthesia Quick Evaluation

## 2023-08-06 NOTE — Progress Notes (Signed)
 S: Comfortable with epidural. Discussed the R/B/A of AROM for labor induction and patient consents to procedure. Husband, Tura Gaines, present and supportive.   O: Vitals:   08/06/23 1246 08/06/23 1335 08/06/23 1340 08/06/23 1345  BP: 124/82 109/73 115/68 (!) 115/55  Pulse: 89 (!) 102 100 (!) 123  Resp: 18     Temp: 97.6 F (36.4 C)     TempSrc: Oral     SpO2:  100% 100% 100%  Weight:      Height:       FHT:  FHR: 115 bpm, variability: moderate,  accelerations:  Present,  decelerations:  Absent UC:   regular, every 2-3 minutes SVE:   Dilation: 5-6/50/-1 Exam by: ajones,cnm  AROM of a large amount of clear fluid at 1400.   A / P: Induction of labor due to suspected LGA, s/p Foley balloon and buccal Cytotec , now AROM for clear fluid  Fetal Wellbeing:  Category I GBS: Negative Pain Control:  Epidural Anticipated MOD:  NSVD  Will start Pitocin  2x2.   Joel Murphy, CNM, MSN 08/06/2023, 2:05 PM

## 2023-08-06 NOTE — H&P (Signed)
 OB ADMISSION/ HISTORY & PHYSICAL:  Admission Date: 08/06/2023  7:37 AM  Admit Diagnosis: Encounter for induction of labor [Z34.90]    Vanessa Winters is a 28 y.o. female G2P1001 at [redacted]w[redacted]d presenting for induction of labor for suspected LGA >95%. Denies contractions, leaking of fluid, or vaginal bleeding. Endorses + fetal movement. Husband, Tura Gaines, present and supportive. Eagerly anticipating a baby boy [place].  Prenatal History: G2P1001   EDC: 08/13/2023 Prenatal care at [facility] since 10 weeks  Primary: A. Rochelle Chu, CNM, transfer from Dr. 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, diagnosed at 28 years old  Prenatal Labs: ABO, Rh:   O POS Antibody: NEG (06/15 0856) Rubella: Immune (11/26 0000)  RPR: Nonreactive (11/26 0000)  HBsAg: Negative (11/26 0000)  HIV: Non-reactive (11/26 0000)  GBS: Negative/-- (05/20 0000)  1 hr Glucola : 90 Genetic Screening: Low risk Panorama XY Ultrasound: normal XY anatomy, anterior placenta    Maternal Diabetes: No Genetic Screening: Normal Maternal Ultrasounds/Referrals: Other: Suspected LGA 95% Fetal Ultrasounds or other Referrals:  None Maternal Substance Abuse:  No Significant Maternal Medications:  None Significant Maternal Lab Results:  Group B Strep negative Other Comments:  None  Medical / Surgical History : Past medical history:  Past Medical History:  Diagnosis Date   Colon polyps    Discharge from right nipple 07/2016   GERD (gastroesophageal reflux disease)    Hyperlipidemia    Migraines    Seasonal allergies     Past surgical history:  Past Surgical History:  Procedure Laterality Date   BREAST DUCTAL SYSTEM EXCISION Right 08/04/2016   Procedure: RIGHT NIPPLE DUCT EXCISION;  Surgeon: Dareen Ebbing, MD;  Location: [facility];  Service: General;  Laterality: Right;   WISDOM TOOTH EXTRACTION       Family History:  Family History  Problem Relation Age of Onset   Hypertension Mother    Breast cancer Mother    Hyperlipidemia Father    Breast cancer Maternal Grandmother    Leukemia Paternal Grandmother    Parkinson's disease Paternal Grandfather    Skin cancer Other        uncle   Heart attack Other        great uncle   Stroke Other        great uncle    Social History:  reports that she quit smoking about 4 years ago. Her smoking use included e-cigarettes and cigarettes. She started smoking about 10 years ago. She has a 3 pack-year smoking history. She has never used smokeless tobacco. She reports current alcohol use. She reports that she does not use drugs.  Allergies: Adhesive [tape] and Buspar  [buspirone ]   Current Medications at time of admission:  Medications Prior to Admission  Medication Sig Dispense Refill Last Dose/Taking   amoxicillin -clavulanate (AUGMENTIN ) 875-125 MG tablet Take 1 tablet by mouth 2 (two) times daily. 20 tablet 0    methylPREDNISolone  (MEDROL  DOSEPAK) 4 MG TBPK tablet Take as directed by package insert. 21 tablet 0    rizatriptan  (MAXALT ) 10 MG tablet Take 1 tablet (10 mg total) by mouth as needed for migraine. May repeat in 2 hours if needed 10 tablet 3     Review of Systems: Review of Systems  All other systems reviewed and are negative.  Physical Exam: Vital signs and nursing notes reviewed.  Patient Vitals for the past 24 hrs:  BP Temp Temp  src Pulse Resp Height Weight  08/06/23 1121 106/67 -- -- (!) 104 18 -- --  08/06/23 0905 115/76 98 F (36.7 C) Oral (!) 103 20 -- --  08/06/23 0806 116/78 (!) 97.5 F (36.4 C) Oral (!) 114 18 -- --  08/06/23 0753 -- -- -- -- -- 5' 1 (1.549 m) 86.6 kg    General: AAO x 3, NAD Heart: RRR Lungs:CTAB Abdomen: Gravid, NT Extremities: no edema SVE: Dilation: 2 Presentation: Vertex Exam by:: ajones,cnm   Foley balloon placed without difficulty. 60cc of fluid instilled in balloon and taped to leg  for traction. Patient tolerated procedure well.   FHR: 120BPM, moderate variability, + accels, no decels TOCO: Contractions occasional  Labs:   Recent Labs    08/06/23 0824  WBC 9.9  HGB 10.4*  HCT 31.8*  PLT 270   Assessment/Plan: 28 y.o. G2P1001 at [redacted]w[redacted]d, IOL for suspected LGA, EFW 95% at 38 weeks Anemia on admission, Hgb 10.4  Fetal wellbeing - FHT category 1 EFW LGA 8-9lbs  Labor: Foley balloon placed, will give buccal Cytotec  50mcg q 4 hours until balloon is expelled, plan AROM and Pitocin  after balloon is expelled  GBS negative Rubella immune Rh positive  Pain control: desires epidural, may place on maternal request Analgesia/anesthesia PRN  Shoulder dystocia precautions at delivery for suspected LGA  Anticipated MOD: NSVB  Plans to breastfeed, plans circumcision. POC discussed with patient and support team, all questions answered.  Dr. Arnett Lanius notified of admission/plan of care.  Joel Murphy CNM, MSN 08/06/2023, 11:32 AM

## 2023-08-06 NOTE — Progress Notes (Signed)
   Delivery Note:   G2P1001 at [redacted]w[redacted]d  Admitting diagnosis: Encounter for induction of labor [Z34.90] Risks: Suspected LGA, >95% Onset of labor: 08/06/2023 at 1125 IOL/Augmentation: AROM, Pitocin , Cytotec , and IP Foley ROM: 08/06/2023 at 1400, clear fluid  Complete dilation at 08/06/2023 1921 Onset of pushing at 1925 FHR second stage Cat II, variables  Analgesia/Anesthesia intrapartum:Epidural Pushing in lithotomy position with CNM and L&D staff support. Husband, Tura Gaines, present for birth and supportive.  Delivery of a Live born female  Birth Weight:  pending APGAR: 8, 9   Newborn Delivery   Birth date/time: 08/06/2023 19:58:40 Delivery type: Vaginal, Spontaneous    in cephalic presentation, position OA to ROA.  APGAR:1 min-8 , 5 min-9   Nuchal Cord: Yes  x 1 Cord double clamped after cessation of pulsation, cut by Alec.  Collection of cord blood for typing completed. Arterial cord blood sample-No   Placenta delivered-Spontaneous with 3 vessels. Uterotonics: Pitocin  Placenta to L&D Uterine tone firm  Bleeding small  None laceration identified.  Episiotomy:None Local analgesia: N/A  Repair: one small skid mark, placed a figure of eight for good hemostasis Est. Blood Loss (mL):417.00  Complications: None  Mom to postpartum. Baby Melodee Spruce to Couplet care / Skin to Skin.  Delivery Report:   Review the Delivery Report for details.    Joel Murphy, CNM, MSN 08/06/2023, 8:22 PM

## 2023-08-06 NOTE — Progress Notes (Signed)
 S: Called to the room by RN for fetal bradycardia. FHR in the 90s starting at 1618, from baseline 110-115bpm. SVE 7cm. FHT in the 80-90s for 12 minutes with return to baseline of 115-120s with variable decels. IUPC and FSE placed. Amnioinfusion started and terb given. Patient and spouse debriefed and reassured. FHT returned to baseline of 110 with moderate variability at 1646.   O: Vitals:   08/06/23 1501 08/06/23 1530 08/06/23 1601 08/06/23 1632  BP: 103/71 119/89 105/73 115/81  Pulse: 89 90 96 (!) 134  Resp: 18 20 18 20   Temp:      TempSrc:      SpO2:      Weight:      Height:       FHT:  FHR: 115 bpm, variability: moderate,  accelerations:  Present,  decelerations:  Present prolonged decel UC:   regular, every 2-3 minutes SVE:   Dilation: 7 Effacement (%): 90 Station: -2 Exam by:: Warren Haber, CNM  A / P: Induction of labor due to suspected LGA, progressing well on Pitocin   Fetal Wellbeing:  Category II Pain Control:  Epidural Anticipated MOD:  NSVD  FHT now reassuring with baseline 115 and no variable decels noted. Will restart Pitocin  after 30 minutes of Cat I FHT.   Dr. Arnett Lanius updated on patient status and plan of care.   Joel Murphy, CNM, MSN 08/06/2023, 4:57 PM

## 2023-08-07 DIAGNOSIS — D62 Acute posthemorrhagic anemia: Secondary | ICD-10-CM | POA: Diagnosis not present

## 2023-08-07 LAB — CBC
HCT: 24.9 % — ABNORMAL LOW (ref 36.0–46.0)
Hemoglobin: 8.3 g/dL — ABNORMAL LOW (ref 12.0–15.0)
MCH: 28.9 pg (ref 26.0–34.0)
MCHC: 33.3 g/dL (ref 30.0–36.0)
MCV: 86.8 fL (ref 80.0–100.0)
Platelets: 208 10*3/uL (ref 150–400)
RBC: 2.87 MIL/uL — ABNORMAL LOW (ref 3.87–5.11)
RDW: 12.5 % (ref 11.5–15.5)
WBC: 17.1 10*3/uL — ABNORMAL HIGH (ref 4.0–10.5)
nRBC: 0 % (ref 0.0–0.2)

## 2023-08-07 MED ORDER — EPINEPHRINE 0.3 MG/0.3ML IJ SOAJ: 0.3000 mg | Freq: Once | INTRAMUSCULAR | Status: DC | PRN

## 2023-08-07 MED ORDER — ALBUTEROL SULFATE (2.5 MG/3ML) 0.083% IN NEBU: 2.5000 mg | INHALATION_SOLUTION | Freq: Once | RESPIRATORY_TRACT | Status: DC | PRN

## 2023-08-07 MED ORDER — SODIUM CHLORIDE 0.9 % IV BOLUS: 500.0000 mL | Freq: Once | INTRAVENOUS | Status: DC | PRN

## 2023-08-07 MED ORDER — SODIUM CHLORIDE 0.9 % IV SOLN: INTRAVENOUS | Status: AC | PRN

## 2023-08-07 MED ORDER — SODIUM CHLORIDE 0.9 % IV SOLN
500.0000 mg | Freq: Once | INTRAVENOUS | Status: AC
  Administered 2023-08-07: 500 mg via INTRAVENOUS
  Filled 2023-08-07: qty 25

## 2023-08-07 MED ORDER — IRON SUCROSE 200 MG IVPB - SIMPLE MED: 200.0000 mg | Freq: Once | Status: DC

## 2023-08-07 MED ORDER — DIPHENHYDRAMINE HCL 50 MG/ML IJ SOLN: 25.0000 mg | Freq: Once | INTRAMUSCULAR | Status: DC | PRN

## 2023-08-07 MED ORDER — METHYLPREDNISOLONE SODIUM SUCC 125 MG IJ SOLR: 125.0000 mg | Freq: Once | INTRAMUSCULAR | Status: DC | PRN

## 2023-08-07 NOTE — Anesthesia Postprocedure Evaluation (Signed)
 Anesthesia Post Note  Patient: Vanessa Winters  Procedure(s) Performed: AN AD HOC LABOR EPIDURAL     Patient location during evaluation: Mother Baby Anesthesia Type: Epidural Level of consciousness: awake and alert and oriented Pain management: satisfactory to patient Vital Signs Assessment: post-procedure vital signs reviewed and stable Respiratory status: respiratory function stable Cardiovascular status: stable Postop Assessment: no headache, no backache, epidural receding, patient able to bend at knees, no signs of nausea or vomiting, adequate PO intake and able to ambulate Anesthetic complications: no   No notable events documented.  Last Vitals:  Vitals:   08/07/23 0238 08/07/23 0611  BP: (!) 97/58 104/70  Pulse: 86 87  Resp: 16 16  Temp: 36.9 C 36.7 C  SpO2: 98% 99%    Last Pain:  Vitals:   08/07/23 0611  TempSrc: Oral  PainSc: 0-No pain   Pain Goal:                   [name]

## 2023-08-07 NOTE — Research (Signed)
 In patient's chart per patient to cancel confidential patient link so family members can visit her and the newborn baby.

## 2023-08-07 NOTE — Clinical Social Work Maternal (Signed)
 CLINICAL SOCIAL WORK MATERNAL/CHILD NOTE  Patient Details  Name: Vanessa Winters MRN: [id number] Date of Birth: 22-Sep-1995  Date:  08/07/2023  Clinical Social Worker Initiating Note:  Jenney Modest Date/Time: Initiated:  08/07/23/1521     Child's Name:  Vanessa Winters   Biological Parents:  Mother, Father Vanessa Winters 11/08/93 Vanessa Winters 12-23-1992)   Need for Interpreter:  None   Reason for Referral:  Behavioral Health Concerns   Address:  [address] [place] [address]    Phone number:  [phone] (home)     Additional phone number:   Household Members/Support Persons (HM/SP):   Household Member/Support Person 1, Household Member/Support Person 2   HM/SP Name Relationship DOB or Age  HM/SP -1 Vanessa Winters Daughter 05-13-2020  HM/SP -2 Vanessa Winters FOB 12-23-1992  HM/SP -3        HM/SP -4        HM/SP -5        HM/SP -6        HM/SP -7        HM/SP -8          Natural Supports (not living in the home):  Spouse/significant other, Immediate Family, Friends, Extended Family   Professional Supports: None   Employment: Environmental education officer   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   Education:  Engineer, maintenance (IT)   Homebound arranged:    Surveyor, quantity Resources:  Media planner    Other Resources:      Cultural/Religious Considerations Which May Impact Care:    Strengths:  Ability to meet basic needs  , Home prepared for child  , Pediatrician chosen   Psychotropic Medications:         Pediatrician:    [facility] area  Pediatrician List:   [facility]  [place]    [place]    [place] [facility]      Pediatrician Fax Number:    Risk Factors/Current Problems:  Mental Health Concerns     Cognitive State:  Able to Concentrate  , Alert  , Linear Thinking  , Insightful  , Goal Oriented     Mood/Affect:  Comfortable  , Calm  , Interested  , Relaxed     CSW Assessment: CSW received a consult for a hx of  Depression; and per chart review Bipolar, and met MOB at bedside to complete a full psychosocial assessment. CSW entered the room, introduced herself and acknowledged that FOB was present. MOB gave CSW verbal permission to speak about anything while FOB was present. CSW explained her role and the reason for the visit. MOB presented bonding/feeding the infant as they laid happily in the bed. MOB was polite, easy to engage, receptive to meeting with CSW, and appeared forthcoming.  CSW collected MOB's demographic information and inquired about mental health history. MOB reported being diagnosed with Bipolar/Depression 2021. MOB denied participating in therapy; however she was prescribed medication in the past. MOB reported she was advised during her first pregnancy to discontinue her medication for the safety of the infant and once she delivered she did not restart her medication. MOB reported her mental health has been stable during her PP period with her first child and during the current pregnancy. MOB reported she hopes to remain off her medication; however she will return to her medication regiment if needed for support. CSW provided education regarding the baby blues period vs. perinatal mood disorders, discussed treatment and gave resources for  mental health follow up if concerns arise.  CSW recommends self-evaluation during the postpartum time period using the New Mom Checklist from Postpartum Progress and encouraged MOB to contact a medical professional if symptoms are noted at any time.  CSW assessed for safety with MOB SI/HI/DV;MOB denied all.  CSW asked MOB does she receive support resources; MOB said NO(WIC and food stamps). MOB reported having all essential items for the infant including a carseat, bassinet and crib for safe sleeping. CSW provided review of Sudden Infant Death Syndrome (SIDS) precautions.    CSW Plan/Description:  CSW identifies no further need for intervention and no barriers to  discharge at this time.     Veva Gower, LCSW 08/07/2023, 3:37 PM

## 2023-08-07 NOTE — Progress Notes (Signed)
   PPD #1 S/P NSVD  Live born female  Birth Weight: 8 lb 0.8 oz (3650 g) APGAR: 8, 9  Newborn Delivery   Birth date/time: 08/06/2023 19:58:40 Delivery type: Vaginal, Spontaneous    Baby name: Vanessa Winters  Delivering provider: Warren Haber K  Lacerations: None  Circumcision: Planning  Feeding: breast  Pain control at delivery: Epidural  S:  Reports feeling well.              Tolerating PO/No nausea or vomiting             Bleeding is light             Pain controlled with acetaminophen  and ibuprofen  (OTC)             Up ad lib/ambulatory/voiding without difficulties   O:  A & O x 3, in no apparent distress  Vitals:   08/07/23 0014 08/07/23 0238 08/07/23 0611 08/07/23 1014  BP: 109/71 (!) 97/58 104/70 101/63  Pulse: 98 86 87   Resp: 16 16 16 16   Temp: 98.5 F (36.9 C) 98.4 F (36.9 C) 98.1 F (36.7 C) 98.3 F (36.8 C)  TempSrc: Oral Oral Oral Oral  SpO2: 98% 98% 99% 99%  Weight:      Height:       Recent Labs    08/06/23 0824 08/07/23 0414  WBC 9.9 17.1*  HGB 10.4* 8.3*  HCT 31.8* 24.9*  PLT 270 208    Blood type: --/--/O POS (06/15 0856)  Rubella: Immune (11/26 0000)   I&O: I/O last 3 completed shifts: In: -  Out: 817 [Urine:400; Blood:417]          No intake/output data recorded.  Gen: AAO x 3, NAD Abdomen: soft, non-tender, non-distended Fundus: firm, non-tender, U-1 Perineum: intact Lochia: small Extremities: no edema, no calf pain or tenderness   A/P:  PPD # 1 27 y.o., [id number]  Principal Problem:   Postpartum care following vaginal delivery 6/15  Doing well - stable status  Routine post partum orders Active Problems:   Anxiety  Stable, plan PP F/U   Encounter for induction of labor   SVD (spontaneous vaginal delivery)   Anemia associated with acute blood loss  Starting Hgb 10.4, dropped to 8.3, asymptomatic  Will give IV Venofer today  Anticipate discharge tomorrow.   Joel Murphy, MSN, CNM 08/07/2023, 12:40 PM

## 2023-08-08 ENCOUNTER — Other Ambulatory Visit (HOSPITAL_BASED_OUTPATIENT_CLINIC_OR_DEPARTMENT_OTHER): Payer: Self-pay

## 2023-08-08 MED ORDER — IBUPROFEN 600 MG PO TABS
600.0000 mg | ORAL_TABLET | Freq: Four times a day (QID) | ORAL | 0 refills | Status: DC
  Filled 2023-08-08 – 2023-08-23 (×2): qty 30, 8d supply, fill #0

## 2023-08-08 MED ORDER — ACETAMINOPHEN 325 MG PO TABS: 650.0000 mg | ORAL_TABLET | ORAL | Status: DC | PRN

## 2023-08-08 NOTE — Discharge Summary (Signed)
 Postpartum Discharge Summary  Date of Service updated 08/08/23     Patient Name: Vanessa Winters DOB: 1995/04/01 MRN: [id number]  Date of admission: 08/06/2023 Delivery date:08/06/2023 Delivering provider: Warren Haber K Date of discharge: 08/08/2023  Admitting diagnosis: Encounter for induction of labor [Z34.90] Intrauterine pregnancy: [redacted]w[redacted]d     Secondary diagnosis:  Principal Problem:   Postpartum care following vaginal delivery 6/15 Active Problems:   Anxiety   Encounter for induction of labor   SVD (spontaneous vaginal delivery)   Anemia associated with acute blood loss  Additional problems: n/a    Discharge diagnosis: Term Pregnancy Delivered                                              Post partum procedures:n/a Augmentation: AROM, Pitocin , Cytotec , and IP Foley Complications: None  Hospital course: Induction of Labor With Vaginal Delivery   28 y.o. yo G2P2002 at [redacted]w[redacted]d was admitted to the hospital 08/06/2023 for induction of labor.  Indication for induction: LGA.  Patient had an labor course complicated by none Membrane Rupture Time/Date: 2:00 PM,08/06/2023  Delivery Method:Vaginal, Spontaneous Operative Delivery:N/A Episiotomy: None Lacerations:  None Details of delivery can be found in separate delivery note.  Patient had a postpartum course complicated by none. Patient is discharged home 08/08/23.  Newborn Data: Birth date:08/06/2023 Birth time:7:58 PM Gender:Female Living status:Living Apgars:8 ,9  Weight:3650 g  Magnesium Sulfate received: No BMZ received: No Rhophylac:N/A MMR:N/A T-DaP:? Transfusion:No Immunizations administered: Immunization History  Administered Date(s) Administered   DTaP 03/15/1994, 05/02/1994, 07/29/1994, 03/07/1995, 07/22/1999   HIB (PRP-OMP) 03/15/1994, 05/02/1994, 07/29/1994, 03/07/1995   Hepatitis B 03-17-1993, 03/15/1994, 07/29/1994   Hepatitis B, ADULT 06/13/2014, 07/11/2014, 12/05/2014   IPV 03/15/1994, 05/02/1994,  07/29/1994, 07/22/1999   Influenza, Seasonal, Injecte, Preservative Fre 10/19/2022   Influenza,inj,Quad PF,6+ Mos 12/12/2019, 12/18/2020, 12/07/2021   Influenza-Unspecified 11/05/2016, 11/21/2017, 11/06/2018   MMR 03/07/1995, 07/22/1999   Moderna Covid-19 Vaccine Bivalent Booster 4yrs & up 01/06/2021, 01/06/2021   PFIZER(Purple Top)SARS-COV-2 Vaccination 04/27/2019, 06/01/2019   Tdap 02/22/2011, 04/21/2011, 04/21/2011, 02/23/2020    Physical exam  Vitals:   08/07/23 1352 08/07/23 1535 08/07/23 2142 08/08/23 0523  BP: 103/67 107/79  106/68  Pulse:  74  86  Resp: 16 16 17 18   Temp: 98.4 F (36.9 C) 98.4 F (36.9 C) 98 F (36.7 C) 98.4 F (36.9 C)  TempSrc: Oral Oral  Oral  SpO2: 99% 99% 98% 98%  Weight:      Height:       General: alert and no distress Lochia: appropriate Uterine Fundus: firm Incision: N/A DVT Evaluation: No evidence of DVT seen on physical exam. Labs: Lab Results  Component Value Date   WBC 17.1 (H) 08/07/2023   HGB 8.3 (L) 08/07/2023   HCT 24.9 (L) 08/07/2023   MCV 86.8 08/07/2023   PLT 208 08/07/2023      Latest Ref Rng & Units 10/19/2022    9:46 AM  CMP  Glucose 70 - 99 mg/dL 87   BUN 6 - 20 mg/dL 10   Creatinine 7.82 - 1.00 mg/dL 9.56   Sodium 213 - 086 mmol/L 139   Potassium 3.5 - 5.2 mmol/L 4.2   Chloride 96 - 106 mmol/L 104   CO2 20 - 29 mmol/L 22   Calcium  8.7 - 10.2 mg/dL 9.2   Total Protein 6.0 - 8.5 g/dL 6.8  Total Bilirubin 0.0 - 1.2 mg/dL 0.4   Alkaline Phos 44 - 121 IU/L 67   AST 0 - 40 IU/L 18   ALT 0 - 32 IU/L 9    Edinburgh Score:    05/14/2020    7:27 AM  Edinburgh Postnatal Depression Scale Screening Tool  I have been able to laugh and see the funny side of things. --      After visit meds:  Allergies as of 08/08/2023       Reactions   Adhesive [tape] Other (See Comments)   STERI-STRIPS:  BLISTERS   Buspar  [buspirone ] Other (See Comments)   Constipation.         Medication List     STOP taking these  medications    amoxicillin -clavulanate 875-125 MG tablet Commonly known as: AUGMENTIN    methylPREDNISolone  4 MG Tbpk tablet Commonly known as: MEDROL  DOSEPAK   rizatriptan  10 MG tablet Commonly known as: MAXALT        TAKE these medications    acetaminophen  325 MG tablet Commonly known as: Tylenol  Take 2 tablets (650 mg total) by mouth every 4 (four) hours as needed (for pain scale < 4).   ibuprofen  600 MG tablet Commonly known as: ADVIL  Take 1 tablet (600 mg total) by mouth every 6 (six) hours.               Discharge Care Instructions  (From admission, onward)           Start     Ordered   08/08/23 0000  Discharge wound care:       Comments: Sitz baths 2 times /day with warm water x 1 week. May add herbals: 1 ounce dried comfrey leaf* 1 ounce calendula flowers 1 ounce lavender flowers  Supplies can be found online at [employer] sources at [employer], [employer]  1/2 ounce dried uva ursi leaves 1/2 ounce witch hazel blossoms (if you can find them) 1/2 ounce dried sage leaf 1/2 cup sea salt Directions: Bring 2 quarts of water to a boil. Turn off heat, and place 1 ounce (approximately 1 large handful) of the above mixed herbs (not the salt) into the pot. Steep, covered, for 30 minutes.  Strain the liquid well with a fine mesh strainer, and discard the herb material. Add 2 quarts of liquid to the tub, along with the 1/2 cup of salt. This medicinal liquid can also be made into compresses and peri-rinses.   08/08/23 1030             Discharge home in stable condition Infant Feeding: Breast Infant Disposition:home with mother Discharge instruction: per After Visit Summary and Postpartum booklet. Activity: Advance as tolerated. Pelvic rest for 6 weeks.  Diet: routine diet Anticipated Birth Control: Unsure Postpartum Appointment:6 weeks Additional Postpartum F/U: Postpartum Depression checkup Future Appointments: Future Appointments   Date Time Provider Department Center  10/20/2023  9:10 AM Araceli Knight, PA-C [facility] None   Follow up Visit:  Follow-up Information     Joel Murphy, CNM. Schedule an appointment as soon as possible for a visit in 6 week(s).   Specialty: Certified Nurse Midwife Contact information: [address] [place] [address] [phone]                     08/08/2023 Olin Bertin, DO

## 2023-08-14 ENCOUNTER — Telehealth (HOSPITAL_COMMUNITY): Payer: Self-pay | Admitting: *Deleted

## 2023-08-14 NOTE — Telephone Encounter (Signed)
 Attempted hospital discharge follow-up call. Left message for patient to return RN call with any questions or concerns. [name], RN, 08/14/23, [phone]

## 2023-08-18 ENCOUNTER — Other Ambulatory Visit (HOSPITAL_BASED_OUTPATIENT_CLINIC_OR_DEPARTMENT_OTHER): Payer: Self-pay

## 2023-08-23 ENCOUNTER — Other Ambulatory Visit (HOSPITAL_BASED_OUTPATIENT_CLINIC_OR_DEPARTMENT_OTHER): Payer: Self-pay

## 2023-09-19 ENCOUNTER — Other Ambulatory Visit (HOSPITAL_BASED_OUTPATIENT_CLINIC_OR_DEPARTMENT_OTHER): Payer: Self-pay

## 2023-09-19 DIAGNOSIS — Z124 Encounter for screening for malignant neoplasm of cervix: Secondary | ICD-10-CM | POA: Diagnosis not present

## 2023-09-19 DIAGNOSIS — Z1331 Encounter for screening for depression: Secondary | ICD-10-CM | POA: Diagnosis not present

## 2023-09-19 MED ORDER — NORETHINDRONE 0.35 MG PO TABS
1.0000 | ORAL_TABLET | Freq: Every day | ORAL | 12 refills | Status: AC
  Filled 2023-09-19: qty 28, 28d supply, fill #0
  Filled 2023-10-13: qty 28, 28d supply, fill #1
  Filled 2023-11-10: qty 28, 28d supply, fill #2
  Filled 2023-12-07: qty 28, 28d supply, fill #3
  Filled 2024-01-07: qty 28, 28d supply, fill #4
  Filled 2024-02-04: qty 28, 28d supply, fill #5
  Filled 2024-03-03: qty 28, 28d supply, fill #6

## 2023-09-20 ENCOUNTER — Other Ambulatory Visit (HOSPITAL_BASED_OUTPATIENT_CLINIC_OR_DEPARTMENT_OTHER): Payer: Self-pay

## 2023-09-25 ENCOUNTER — Other Ambulatory Visit: Payer: Self-pay | Admitting: Physician Assistant

## 2023-09-25 DIAGNOSIS — R5383 Other fatigue: Secondary | ICD-10-CM | POA: Insufficient documentation

## 2023-09-26 DIAGNOSIS — R5383 Other fatigue: Secondary | ICD-10-CM | POA: Diagnosis not present

## 2023-09-27 ENCOUNTER — Ambulatory Visit: Payer: Self-pay | Admitting: Physician Assistant

## 2023-09-27 LAB — CBC WITH DIFFERENTIAL/PLATELET
Basophils Absolute: 0 x10E3/uL (ref 0.0–0.2)
Basos: 0 %
EOS (ABSOLUTE): 0.1 x10E3/uL (ref 0.0–0.4)
Eos: 1 %
Hematocrit: 39.3 % (ref 34.0–46.6)
Hemoglobin: 12.6 g/dL (ref 11.1–15.9)
Immature Grans (Abs): 0 x10E3/uL (ref 0.0–0.1)
Immature Granulocytes: 0 %
Lymphocytes Absolute: 2.5 x10E3/uL (ref 0.7–3.1)
Lymphs: 36 %
MCH: 29.7 pg (ref 26.6–33.0)
MCHC: 32.1 g/dL (ref 31.5–35.7)
MCV: 93 fL (ref 79–97)
Monocytes Absolute: 0.4 x10E3/uL (ref 0.1–0.9)
Monocytes: 5 %
Neutrophils Absolute: 4 x10E3/uL (ref 1.4–7.0)
Neutrophils: 58 %
Platelets: 290 x10E3/uL (ref 150–450)
RBC: 4.24 x10E6/uL (ref 3.77–5.28)
RDW: 15.2 % (ref 11.7–15.4)
WBC: 7 x10E3/uL (ref 3.4–10.8)

## 2023-09-27 LAB — CORTISOL: Cortisol: 11.3 ug/dL (ref 6.2–19.4)

## 2023-09-27 LAB — CMP14+EGFR
ALT: 10 IU/L (ref 0–32)
AST: 18 IU/L (ref 0–40)
Albumin: 4.2 g/dL (ref 4.0–5.0)
Alkaline Phosphatase: 92 IU/L (ref 44–121)
BUN/Creatinine Ratio: 13 (ref 9–23)
BUN: 11 mg/dL (ref 6–20)
Bilirubin Total: <0.2 mg/dL (ref 0.0–1.2)
CO2: 20 mmol/L (ref 20–29)
Calcium: 8.9 mg/dL (ref 8.7–10.2)
Chloride: 104 mmol/L (ref 96–106)
Creatinine, Ser: 0.83 mg/dL (ref 0.57–1.00)
Globulin, Total: 2.1 g/dL (ref 1.5–4.5)
Glucose: 101 mg/dL — ABNORMAL HIGH (ref 70–99)
Potassium: 5 mmol/L (ref 3.5–5.2)
Sodium: 140 mmol/L (ref 134–144)
Total Protein: 6.3 g/dL (ref 6.0–8.5)
eGFR: 98 mL/min/1.73 (ref >59)

## 2023-09-27 LAB — C-REACTIVE PROTEIN: CRP: 2 mg/L (ref 0–10)

## 2023-09-27 LAB — B12 AND FOLATE PANEL
Folate: >20 ng/mL (ref >3.0)
Vitamin B-12: 326 pg/mL (ref 232–1245)

## 2023-09-27 LAB — TSH+FREE T4
Free T4: 0.7 ng/dL — ABNORMAL LOW (ref 0.82–1.77)
TSH: 0.797 u[IU]/mL (ref 0.450–4.500)

## 2023-09-27 LAB — IRON,TIBC AND FERRITIN PANEL
Ferritin: 68 ng/mL (ref 15–150)
Iron Saturation: 15 % (ref 15–55)
Iron: 45 ug/dL (ref 27–159)
Total Iron Binding Capacity: 307 ug/dL (ref 250–450)
UIBC: 262 ug/dL (ref 131–425)

## 2023-09-27 LAB — THYROID PEROXIDASE ANTIBODY: Thyroperoxidase Ab SerPl-aCnc: <9 [IU]/mL (ref 0–34)

## 2023-09-27 LAB — VITAMIN D 25 HYDROXY (VIT D DEFICIENCY, FRACTURES): Vit D, 25-Hydroxy: 34.5 ng/mL (ref 30.0–100.0)

## 2023-09-27 LAB — SEDIMENTATION RATE: Sed Rate: 2 mm/h (ref 0–32)

## 2023-09-27 NOTE — Progress Notes (Signed)
 Vanessa Winters,   No abnormal inflammatory markers.  No antibodies to thyroid .  Cortisol looks good.  B12 on low normal. Start sublingual b12 1000mcg daily.  Vitamin D  low normal. Increase by 1000 units daily. 03-4998 units a day with dairy for better absorption.  Serum iron  normal.  Kidney and liver look good.  Free T4 a little low but your TSH is trending HYPER. Likely not making you tired. I would suggest a repeat in 4-6 weeks to see how levels are trending.

## 2023-10-20 ENCOUNTER — Ambulatory Visit (INDEPENDENT_AMBULATORY_CARE_PROVIDER_SITE_OTHER): Payer: 59 | Admitting: Physician Assistant

## 2023-10-20 ENCOUNTER — Encounter: Payer: Self-pay | Admitting: Physician Assistant

## 2023-10-20 VITALS — BP 114/58 | HR 90 | Ht 61.0 in | Wt 166.0 lb

## 2023-10-20 DIAGNOSIS — R7989 Other specified abnormal findings of blood chemistry: Secondary | ICD-10-CM | POA: Diagnosis not present

## 2023-10-20 DIAGNOSIS — Z0000 Encounter for general adult medical examination without abnormal findings: Secondary | ICD-10-CM | POA: Diagnosis not present

## 2023-10-20 DIAGNOSIS — Z13220 Encounter for screening for lipoid disorders: Secondary | ICD-10-CM

## 2023-10-20 DIAGNOSIS — E6609 Other obesity due to excess calories: Secondary | ICD-10-CM | POA: Diagnosis not present

## 2023-10-20 DIAGNOSIS — Z803 Family history of malignant neoplasm of breast: Secondary | ICD-10-CM | POA: Diagnosis not present

## 2023-10-20 DIAGNOSIS — E66811 Obesity, class 1: Secondary | ICD-10-CM | POA: Diagnosis not present

## 2023-10-20 DIAGNOSIS — R5383 Other fatigue: Secondary | ICD-10-CM | POA: Diagnosis not present

## 2023-10-20 DIAGNOSIS — Z6831 Body mass index (BMI) 31.0-31.9, adult: Secondary | ICD-10-CM | POA: Diagnosis not present

## 2023-10-20 DIAGNOSIS — R7301 Impaired fasting glucose: Secondary | ICD-10-CM | POA: Diagnosis not present

## 2023-10-20 DIAGNOSIS — Z1231 Encounter for screening mammogram for malignant neoplasm of breast: Secondary | ICD-10-CM

## 2023-10-20 NOTE — Patient Instructions (Signed)

## 2023-10-20 NOTE — Progress Notes (Signed)
 Complete physical exam  Patient: Vanessa Winters   DOB: 06-Feb-1996   28 y.o. Female  MRN: [id number]  Subjective:    Chief Complaint  Patient presents with   Annual Exam    Vanessa Winters is a 28 y.o. female who presents today for a complete physical exam. She reports consuming a general diet. Pt walks about 3 times a week about 3 miles at a time.  She generally feels fairly well. She reports sleeping well. She does not have additional problems to discuss today. She is 6 weeks post partum. She is breast feeding.    Most recent fall risk assessment:    10/19/2022   12:05 PM  Fall Risk   Falls in the past year? 0  Number falls in past yr: 0  Injury with Fall? 0  Risk for fall due to : No Fall Risks  Follow up Falls evaluation completed     Most recent depression screenings:    10/20/2023   10:50 AM 04/06/2022    1:05 PM  PHQ 2/9 Scores  PHQ - 2 Score 0 0  PHQ- 9 Score 0     Vision:Within last year and Dental: No current dental problems and Receives regular dental care  Patient Active Problem List   Diagnosis Date Noted   Family history of breast cancer 10/20/2023   Other fatigue 09/25/2023   Anemia associated with acute blood loss 08/07/2023   Class 1 obesity due to excess calories without serious comorbidity with body mass index (BMI) of 31.0 to 31.9 in adult 04/06/2022   Encounter for induction of labor 05/13/2020   SVD (spontaneous vaginal delivery) 05/13/2020   Postpartum care following vaginal delivery 6/15 05/13/2020   Anxiety 07/25/2018   No energy 07/25/2018   Past Medical History:  Diagnosis Date   Colon polyps    Discharge from right nipple 07/2016   GERD (gastroesophageal reflux disease)    Hyperlipidemia    Migraines    Seasonal allergies    Past Surgical History:  Procedure Laterality Date   BREAST DUCTAL SYSTEM EXCISION Right 08/04/2016   Procedure: RIGHT NIPPLE DUCT EXCISION;  Surgeon: Belinda Cough, MD;  Location: [facility];   Service: General;  Laterality: Right;   WISDOM TOOTH EXTRACTION     Family History  Problem Relation Age of Onset   Hypertension Mother    Breast cancer Mother    Hyperlipidemia Father    Breast cancer Maternal Grandmother    Leukemia Paternal Grandmother    Parkinson's disease Paternal Grandfather    Skin cancer Other        uncle   Heart attack Other        great uncle   Stroke Other        great uncle   Allergies  Allergen Reactions   Adhesive [Tape] Other (See Comments)    STERI-STRIPS:  BLISTERS   Buspar  [Buspirone ] Other (See Comments)    Constipation.       Patient Care Team: Shemiah Rosch L, PA-C as PCP - General (Family Medicine) Gorge Ade, MD as Consulting Physician (Obstetrics and Gynecology)   Outpatient Medications Prior to Visit  Medication Sig   norethindrone  (CAMILA ) 0.35 MG tablet Take 1 tablet (0.35 mg total) by mouth daily.   [DISCONTINUED] ibuprofen  (ADVIL ) 600 MG tablet Take 1 tablet (600 mg total) by mouth every 6 (six) hours.   No facility-administered medications prior to visit.    Review of Systems  All other systems reviewed and  are negative.         Objective:     BP (!) 114/58   Pulse 90   Ht 5' 1 (1.549 m)   Wt 166 lb (75.3 kg)   SpO2 99%   Breastfeeding Yes   BMI 31.37 kg/m  BP Readings from Last 3 Encounters:  10/20/23 (!) 114/58  08/08/23 106/68  11/01/22 123/68   Wt Readings from Last 3 Encounters:  10/20/23 166 lb (75.3 kg)  08/06/23 191 lb (86.6 kg)  11/01/22 168 lb (76.2 kg)      Physical Exam  BP (!) 114/58   Pulse 90   Ht 5' 1 (1.549 m)   Wt 166 lb (75.3 kg)   SpO2 99%   Breastfeeding Yes   BMI 31.37 kg/m   General Appearance:    Alert, cooperative,obese no distress, appears stated age  Head:    Normocephalic, without obvious abnormality, atraumatic  Eyes:    PERRL, conjunctiva/corneas clear, EOM's intact, fundi    benign, both eyes  Ears:    Normal TM's and external ear canals, both  ears  Nose:   Nares normal, septum midline, mucosa normal, no drainage    or sinus tenderness  Throat:   Lips, mucosa, and tongue normal; teeth and gums normal  Neck:   Supple, symmetrical, trachea midline, no adenopathy;    thyroid :  no enlargement/tenderness/nodules; no carotid   bruit or JVD  Back:     Symmetric, no curvature, ROM normal, no CVA tenderness  Lungs:     Clear to auscultation bilaterally, respirations unlabored  Chest Wall:    No tenderness or deformity   Heart:    Regular rate and rhythm, S1 and S2 normal, no murmur, rub   or gallop     Abdomen:     Soft, non-tender, bowel sounds active all four quadrants,    no masses, no organomegaly        Extremities:   Extremities normal, atraumatic, no cyanosis or edema  Pulses:   2+ and symmetric all extremities  Skin:   Skin color, texture, turgor normal, no rashes or lesions  Lymph nodes:   Cervical, supraclavicular, and axillary nodes normal  Neurologic:   CNII-XII intact, normal strength, sensation and reflexes    throughout      Assessment & Plan:    Routine Health Maintenance and Physical Exam  Immunization History  Administered Date(s) Administered   DTaP 03/15/1994, 05/02/1994, 07/29/1994, 03/07/1995, 07/22/1999   HIB (PRP-OMP) 03/15/1994, 05/02/1994, 07/29/1994, 03/07/1995   Hepatitis B 12/11/93, 03/15/1994, 07/29/1994   Hepatitis B, ADULT 06/13/2014, 07/11/2014, 12/05/2014   IPV 03/15/1994, 05/02/1994, 07/29/1994, 07/22/1999   Influenza, Seasonal, Injecte, Preservative Fre 10/19/2022   Influenza,inj,Quad PF,6+ Mos 12/12/2019, 12/18/2020, 12/07/2021   Influenza-Unspecified 11/05/2016, 11/21/2017, 11/06/2018   MMR 03/07/1995, 07/22/1999   Moderna Covid-19 Vaccine Bivalent Booster 9yrs & up 01/06/2021, 01/06/2021   PFIZER(Purple Top)SARS-COV-2 Vaccination 04/27/2019, 06/01/2019   Tdap 02/22/2011, 04/21/2011, 04/21/2011, 02/23/2020    Health Maintenance  Topic Date Due   HPV VACCINES (1 - 3-dose SCDM  series) Never done   Cervical Cancer Screening (Pap smear)  08/21/2022   INFLUENZA VACCINE  09/22/2023   COVID-19 Vaccine (5 - 2025-26 season) 10/23/2023   DTaP/Tdap/Td (10 - Td or Tdap) 02/22/2030   Hepatitis B Vaccines 19-59 Average Risk  Completed   Hepatitis C Screening  Completed   HIV Screening  Completed   Pneumococcal Vaccine  Aged Out   Meningococcal B Vaccine  Aged Out  Discussed health benefits of physical activity, and encouraged her to engage in regular exercise appropriate for her age and condition.  SABRASherril was seen today for annual exam.  Diagnoses and all orders for this visit:  Routine physical examination -     Lipid panel -     Hemoglobin A1c -     TSH + free T4  Elevated fasting glucose -     Hemoglobin A1c  Low TSH level -     TSH + free T4  Screening for lipid disorders -     Lipid panel  Family history of breast cancer -     MM 3D SCREENING MAMMOGRAM BILATERAL BREAST  Encounter for screening mammogram for malignant neoplasm of breast -     MM 3D SCREENING MAMMOGRAM BILATERAL BREAST  No energy  Class 1 obesity due to excess calories without serious comorbidity with body mass index (BMI) of 31.0 to 31.9 in adult   .SABRA Discussed 150 minutes of exercise a week.  Encouraged vitamin D  1000 units and Calcium  1300mg  or 4 servings of dairy a day.  No falls PHQ no concerns Vitals look great Pap UTD with GYN Pt has family hx of mother with BC at 38, early screening mammogram ordered to have done 10 years before Labs ordered for fatigue although some fatigue expected in the post partum period  TSH was off some and glucose elevated A!C, lipid, TSH ordered again today Continue to take prenatal vitamins in the post partum period Pt declines covid vaccine She will get flu shot at work   Return in about 1 year (around 10/19/2024).     Latoya Maulding, PA-C

## 2023-10-21 LAB — LIPID PANEL
Chol/HDL Ratio: 5.1 ratio — ABNORMAL HIGH (ref 0.0–4.4)
Cholesterol, Total: 226 mg/dL — ABNORMAL HIGH (ref 100–199)
HDL: 44 mg/dL (ref >39)
LDL Chol Calc (NIH): 175 mg/dL — ABNORMAL HIGH (ref 0–99)
Triglycerides: 42 mg/dL (ref 0–149)
VLDL Cholesterol Cal: 7 mg/dL (ref 5–40)

## 2023-10-21 LAB — HEMOGLOBIN A1C
Est. average glucose Bld gHb Est-mCnc: 100 mg/dL
Hgb A1c MFr Bld: 5.1 % (ref 4.8–5.6)

## 2023-10-21 LAB — TSH+FREE T4
Free T4: 1.01 ng/dL (ref 0.82–1.77)
TSH: 0.744 u[IU]/mL (ref 0.450–4.500)

## 2023-10-23 ENCOUNTER — Ambulatory Visit: Payer: Self-pay | Admitting: Physician Assistant

## 2023-10-23 DIAGNOSIS — Z1239 Encounter for other screening for malignant neoplasm of breast: Secondary | ICD-10-CM

## 2023-10-23 NOTE — Progress Notes (Signed)
 [name],   LDL better than last year. Will wait until not breastfeeding to restart statin.  Thyroid  looks great now.  A1c is normal not close to pre-diabetes or diabetes.

## 2023-10-24 ENCOUNTER — Encounter: Payer: Self-pay | Admitting: Sports Medicine

## 2023-11-29 ENCOUNTER — Encounter: Payer: Self-pay | Admitting: Physician Assistant

## 2023-11-29 ENCOUNTER — Other Ambulatory Visit (HOSPITAL_BASED_OUTPATIENT_CLINIC_OR_DEPARTMENT_OTHER): Payer: Self-pay

## 2023-11-29 DIAGNOSIS — G43011 Migraine without aura, intractable, with status migrainosus: Secondary | ICD-10-CM

## 2023-11-29 MED ORDER — RIZATRIPTAN BENZOATE 10 MG PO TABS
10.0000 mg | ORAL_TABLET | ORAL | 1 refills | Status: AC | PRN
  Filled 2023-11-29: qty 30, 51d supply, fill #0
  Filled 2024-02-04: qty 30, 51d supply, fill #1

## 2023-12-01 ENCOUNTER — Other Ambulatory Visit (HOSPITAL_BASED_OUTPATIENT_CLINIC_OR_DEPARTMENT_OTHER): Payer: Self-pay

## 2023-12-01 MED ORDER — FLUZONE 0.5 ML IM SUSY
0.5000 mL | PREFILLED_SYRINGE | Freq: Once | INTRAMUSCULAR | 0 refills | Status: AC
  Filled 2023-12-01: qty 0.5, 1d supply, fill #0

## 2023-12-11 ENCOUNTER — Other Ambulatory Visit (HOSPITAL_BASED_OUTPATIENT_CLINIC_OR_DEPARTMENT_OTHER): Payer: Self-pay

## 2023-12-14 ENCOUNTER — Ambulatory Visit

## 2023-12-14 DIAGNOSIS — Z1231 Encounter for screening mammogram for malignant neoplasm of breast: Secondary | ICD-10-CM

## 2023-12-19 NOTE — Progress Notes (Signed)
 Normal mammogram. MRI ordered for screening due to high risk.

## 2024-10-21 ENCOUNTER — Encounter: Admitting: Physician Assistant
